# Patient Record
Sex: Male | Born: 1960 | Race: White | Hispanic: No | Marital: Married | Smoking: Current every day smoker
Health system: Southern US, Community
[De-identification: ages and names within clinical notes are randomized; demographics above are authoritative.]

## PROBLEM LIST (undated history)

## (undated) DIAGNOSIS — I10 Essential (primary) hypertension: Secondary | ICD-10-CM

## (undated) DIAGNOSIS — E119 Type 2 diabetes mellitus without complications: Secondary | ICD-10-CM

## (undated) DIAGNOSIS — M1990 Unspecified osteoarthritis, unspecified site: Secondary | ICD-10-CM

## (undated) DIAGNOSIS — K5792 Diverticulitis of intestine, part unspecified, without perforation or abscess without bleeding: Secondary | ICD-10-CM

## (undated) HISTORY — DX: Diverticulitis of intestine, part unspecified, without perforation or abscess without bleeding: K57.92

## (undated) HISTORY — PX: COLONOSCOPY: SHX174

## (undated) HISTORY — PX: NO PAST SURGERIES: SHX2092

## (undated) HISTORY — PX: HYDROCELE EXCISION / REPAIR: SUR1145

---

## 2001-11-19 ENCOUNTER — Emergency Department (HOSPITAL_COMMUNITY): Admission: EM | Admit: 2001-11-19 | Discharge: 2001-11-19 | Payer: Self-pay | Admitting: Emergency Medicine

## 2001-11-19 ENCOUNTER — Encounter: Payer: Self-pay | Admitting: Emergency Medicine

## 2002-01-17 ENCOUNTER — Emergency Department (HOSPITAL_COMMUNITY): Admission: EM | Admit: 2002-01-17 | Discharge: 2002-01-17 | Payer: Self-pay | Admitting: Emergency Medicine

## 2002-01-17 ENCOUNTER — Encounter: Payer: Self-pay | Admitting: Emergency Medicine

## 2003-05-29 ENCOUNTER — Emergency Department (HOSPITAL_COMMUNITY): Admission: EM | Admit: 2003-05-29 | Discharge: 2003-05-29 | Payer: Self-pay | Admitting: Emergency Medicine

## 2003-05-30 ENCOUNTER — Emergency Department (HOSPITAL_COMMUNITY): Admission: EM | Admit: 2003-05-30 | Discharge: 2003-05-30 | Payer: Self-pay | Admitting: *Deleted

## 2005-11-02 ENCOUNTER — Encounter: Admission: RE | Admit: 2005-11-02 | Discharge: 2005-11-02 | Payer: Self-pay | Admitting: Family Medicine

## 2005-11-13 ENCOUNTER — Encounter: Admission: RE | Admit: 2005-11-13 | Discharge: 2005-11-13 | Payer: Self-pay | Admitting: Family Medicine

## 2006-11-29 ENCOUNTER — Ambulatory Visit (HOSPITAL_BASED_OUTPATIENT_CLINIC_OR_DEPARTMENT_OTHER): Admission: RE | Admit: 2006-11-29 | Discharge: 2006-11-29 | Payer: Self-pay | Admitting: Urology

## 2006-11-29 ENCOUNTER — Encounter (INDEPENDENT_AMBULATORY_CARE_PROVIDER_SITE_OTHER): Payer: Self-pay | Admitting: Urology

## 2009-08-31 ENCOUNTER — Encounter: Admission: RE | Admit: 2009-08-31 | Discharge: 2009-10-28 | Payer: Self-pay | Admitting: Family Medicine

## 2010-06-13 NOTE — Op Note (Signed)
NAMECAMAR, GUYTON                ACCOUNT NO.:  [id number]   MEDICAL RECORD NO.:  [id number]          PATIENT TYPE:  AMB   LOCATION:  [facility]                         FACILITY:  [facility]   PHYSICIAN:  Sigmund I. Patsi Sears, M.D.DATE OF BIRTH:  [birth year]   DATE OF PROCEDURE:  DATE OF DISCHARGE:                               OPERATIVE REPORT   DATE OF DICTATION:  11/29/2006   PREOPERATIVE DIAGNOSIS:  Right hydrocele.   POSTOPERATIVE DIAGNOSIS:  Right hydrocele.   OPERATION:  Right hydrocelectomy (2 50 mL plus).   SURGEON:  Sigmund I. Patsi Sears, M.D.   ANESTHESIA:  General LMA.   PREPARATION:  After appropriate pre anesthesia, the patient was brought  to the operating room, placed upon the operating table in the dorsal  supine position where general LMA anesthesia was induced.  He was then  replaced in dorsal lithotomy position where the pubis was prepped with  Betadine solution and draped in the usual fashion.   REVIEW OF HISTORY:  Mr. Vosler is a 50 year old male with a history of a  large right hydrocele times several years, which is enlarging, as well  as a history of hypogonadism and erectile dysfunction .  He is now for  repair.   DESCRIPTION OF PROCEDURE:  A 6 cm right hemi scrotal incision was made,  subcutaneous tissue dissected with the electrosurgical unit.  The right  testicle was delivered into the wound, the tunica vaginalis was incised  and 250 mL of straw-colored fluid were removed.  The tunica vaginalis  was then completely incised, tissue removed and sent to the laboratory  for examination.  Each wing of the hydrocele sac was then sutured and  then the two wings were sutured behind the testicle.  The testicle was  delivered back into the wound.  It was noted that there was a large  amount of yellowish stone type granular material stuck to the tissue  wall, consistent with chronic epididymitis and infection.  The  epididymis, however, appeared normal at this time.   Following  irrigation, a Penrose drain was placed in the scrotum, and sutured in  place with 3-0 Vicryl suture.   The wound was then closed in two layers with 3-0 Vicryl suture, and  sterile dressing was applied.  Fluff dressing was applied and a pair of  mesh pants was placed on the patient.  The patient tolerated the  procedure well, was awakened and taken to the recovery room in good  condition.  Note that he received IV Toradol before awakening, and also  intraoperatively, he received intradermal Marcaine in the wound.     Sigmund I. Patsi Sears, M.D.  Electronically Signed    SIT/MEDQ  D:  11/29/2006  T:  11/30/2006  Job:  [id number]

## 2010-11-08 LAB — POCT I-STAT 4, (NA,K, GLUC, HGB,HCT): Hemoglobin: 18.7 — ABNORMAL HIGH

## 2013-08-30 ENCOUNTER — Emergency Department (HOSPITAL_COMMUNITY)
Admission: EM | Admit: 2013-08-30 | Discharge: 2013-08-30 | Disposition: A | Discharge status: Home / Self Care | Payer: Self-pay | Attending: Emergency Medicine | Admitting: Emergency Medicine

## 2013-08-30 ENCOUNTER — Encounter (HOSPITAL_COMMUNITY): Payer: Self-pay | Admitting: Emergency Medicine

## 2013-08-30 DIAGNOSIS — F17200 Nicotine dependence, unspecified, uncomplicated: Secondary | ICD-10-CM | POA: Insufficient documentation

## 2013-08-30 DIAGNOSIS — M79609 Pain in unspecified limb: Secondary | ICD-10-CM | POA: Insufficient documentation

## 2013-08-30 DIAGNOSIS — M79662 Pain in left lower leg: Secondary | ICD-10-CM

## 2013-08-30 DIAGNOSIS — E119 Type 2 diabetes mellitus without complications: Secondary | ICD-10-CM | POA: Insufficient documentation

## 2013-08-30 DIAGNOSIS — I10 Essential (primary) hypertension: Secondary | ICD-10-CM | POA: Insufficient documentation

## 2013-08-30 HISTORY — DX: Essential (primary) hypertension: I10

## 2013-08-30 HISTORY — DX: Type 2 diabetes mellitus without complications: E11.9

## 2013-08-30 LAB — CBG MONITORING, ED: Glucose-Capillary: 125 mg/dL — ABNORMAL HIGH (ref 70–99)

## 2013-08-30 MED ORDER — IBUPROFEN 800 MG PO TABS: 800.0000 mg | ORAL_TABLET | Freq: Three times a day (TID) | ORAL | Status: DC | PRN

## 2013-08-30 MED ORDER — HYDROCODONE-ACETAMINOPHEN 5-325 MG PO TABS: 1.0000 | ORAL_TABLET | Freq: Four times a day (QID) | ORAL | Status: DC | PRN

## 2013-08-30 NOTE — Progress Notes (Signed)
VASCULAR LAB PRELIMINARY  PRELIMINARY  PRELIMINARY  PRELIMINARY  Left lower extremity venous Doppler completed.    Preliminary report:  There is no DVT or SVT noted in the left lower extremity.   Donald Bender, RVT 08/30/2013, 2:02 PM

## 2013-08-30 NOTE — ED Notes (Addendum)
Pt c/o pain to left knee x 3 weeks. Pt reports pain now more to the back of knee. Pt ambulatory in triage. Pt also reports history of diabetes and hypertension. Pt has been out of his blood pressure and diabetic medications x 1 year due to no insurance.

## 2013-08-30 NOTE — Discharge Instructions (Signed)
Return here as needed.  Followup with the clinic provided.  Use ice and heat on on the area that is sore

## 2013-08-30 NOTE — ED Provider Notes (Signed)
Medical screening examination/treatment/procedure(s) were performed by non-physician practitioner and as supervising physician I was immediately available for consultation/collaboration.   EKG Interpretation None        Malvin Johns, MD 08/30/13 1654

## 2013-08-30 NOTE — ED Provider Notes (Signed)
CSN: [id number]     Arrival date & time 08/30/13  1158 History   First MD Initiated Contact with Patient 08/30/13 1252     Chief Complaint  Patient presents with  . Leg Pain     (Consider location/radiation/quality/duration/timing/severity/associated sxs/prior Treatment) HPI Patient presents to the emergency department with left calf pain that started several days, ago.  The patient, states, that he was out-of-town working when the pain started in his upper calf.  Patient, states, that he does not recall any injury, but still like the pain started in the front part of his knee, but now is located in his calf region.  The patient, states, that he did not take any medications for the pain.  The patient denies chest pain, shortness breath, weakness, dizziness, headache, blurred vision, back pain, numbness, fever, dysuria, incontinence, or abdominal pain.  The patient, states, that palpation makes the pain, worse Past Medical History  Diagnosis Date  . Hypertension   . Diabetes mellitus without complication    History reviewed. No pertinent past surgical history. No family history on file. History  Substance Use Topics  . Smoking status: Current Every Day Smoker  . Smokeless tobacco: Not on file  . Alcohol Use: Yes    Review of Systems  All other systems negative except as documented in the HPI. All pertinent positives and negatives as reviewed in the HPI.   Allergies  Review of patient's allergies indicates no known allergies.  Home Medications   Prior to Admission medications   Not on File   BP 190/88  Pulse 91  Temp(Src) 97.6 F (36.4 C) (Oral)  Resp 20  Ht 6\' 3"  (1.905 m)  Wt 250 lb (113.399 kg)  BMI 31.25 kg/m2  SpO2 91% Physical Exam  Nursing note and vitals reviewed. Constitutional: He is oriented to person, place, and time. He appears well-developed and well-nourished. No distress.  HENT:  Head: Normocephalic and atraumatic.  Mouth/Throat: Oropharynx is clear  and moist.  Eyes: Pupils are equal, round, and reactive to light.  Neck: Normal range of motion. Neck supple.  Cardiovascular: Normal rate, regular rhythm and normal heart sounds.  Exam reveals no gallop and no friction rub.   No murmur heard. Pulmonary/Chest: Effort normal and breath sounds normal. No respiratory distress.  Musculoskeletal:       Left lower leg: He exhibits tenderness. He exhibits no bony tenderness and no swelling.       Legs: Neurological: He is alert and oriented to person, place, and time.  Skin: Skin is warm and dry. No erythema.    ED Course  Procedures (including critical care time) Labs Review Labs Reviewed  CBG MONITORING, ED - Abnormal; Notable for the following:    Glucose-Capillary 125 (*)    All other components within normal limits   Patient has a negative DVT study.  He is advised of the results of all questions were answered.  I have given.  Followup with her primary care Dr. also, will treat with ice and heat to the area that is sore, along with pain control     Brent General, PA-C 08/30/13 1426

## 2013-08-31 NOTE — Progress Notes (Signed)
CM consulted to follow up with patient for medication assist and need for PCP for follow up. CM attempted to contact patient x2 via contact numbers listed. Unable to contact patient and no ability to leave phone message for follow up. No other CM needs identified. Edwyna Shell, RN, BSN, Case Manager 08/31/2013 2:04 PM

## 2013-09-11 ENCOUNTER — Encounter: Payer: Self-pay | Admitting: Internal Medicine

## 2013-09-11 ENCOUNTER — Ambulatory Visit: Payer: Self-pay | Attending: Internal Medicine | Admitting: Internal Medicine

## 2013-09-11 VITALS — BP 156/97 | HR 90 | Temp 98.7°F | Resp 18 | Ht 75.0 in | Wt 259.0 lb

## 2013-09-11 DIAGNOSIS — F17200 Nicotine dependence, unspecified, uncomplicated: Secondary | ICD-10-CM | POA: Insufficient documentation

## 2013-09-11 DIAGNOSIS — E1110 Type 2 diabetes mellitus with ketoacidosis without coma: Secondary | ICD-10-CM | POA: Insufficient documentation

## 2013-09-11 DIAGNOSIS — I10 Essential (primary) hypertension: Secondary | ICD-10-CM | POA: Insufficient documentation

## 2013-09-11 DIAGNOSIS — E119 Type 2 diabetes mellitus without complications: Secondary | ICD-10-CM | POA: Insufficient documentation

## 2013-09-11 DIAGNOSIS — IMO0001 Reserved for inherently not codable concepts without codable children: Secondary | ICD-10-CM

## 2013-09-11 DIAGNOSIS — M79609 Pain in unspecified limb: Secondary | ICD-10-CM | POA: Insufficient documentation

## 2013-09-11 DIAGNOSIS — E1165 Type 2 diabetes mellitus with hyperglycemia: Secondary | ICD-10-CM

## 2013-09-11 LAB — COMPLETE METABOLIC PANEL WITH GFR
ALBUMIN: 4.1 g/dL (ref 3.5–5.2)
ALK PHOS: 46 U/L (ref 39–117)
ALT: 26 U/L (ref 0–53)
AST: 18 U/L (ref 0–37)
BILIRUBIN TOTAL: 0.6 mg/dL (ref 0.2–1.2)
BUN: 10 mg/dL (ref 6–23)
CO2: 27 mEq/L (ref 19–32)
Calcium: 9.3 mg/dL (ref 8.4–10.5)
Chloride: 100 mEq/L (ref 96–112)
Creat: 0.69 mg/dL (ref 0.50–1.35)
GFR, Est African American: >89 mL/min
GLUCOSE: 122 mg/dL — AB (ref 70–99)
POTASSIUM: 4.6 meq/L (ref 3.5–5.3)
SODIUM: 139 meq/L (ref 135–145)
TOTAL PROTEIN: 6.9 g/dL (ref 6.0–8.3)

## 2013-09-11 LAB — LIPID PANEL
CHOL/HDL RATIO: 4.6 ratio
CHOLESTEROL: 202 mg/dL — AB (ref 0–200)
HDL: 44 mg/dL (ref >39)
LDL CALC: 102 mg/dL — AB (ref 0–99)
TRIGLYCERIDES: 281 mg/dL — AB (ref <150)
VLDL: 56 mg/dL — AB (ref 0–40)

## 2013-09-11 LAB — CBC
HCT: 51.3 % (ref 39.0–52.0)
HEMOGLOBIN: 18.3 g/dL — AB (ref 13.0–17.0)
MCH: 34.6 pg — AB (ref 26.0–34.0)
MCHC: 35.7 g/dL (ref 30.0–36.0)
MCV: 97 fL (ref 78.0–100.0)
PLATELETS: 177 10*3/uL (ref 150–400)
RBC: 5.29 MIL/uL (ref 4.22–5.81)
RDW: 12.6 % (ref 11.5–15.5)
WBC: 9.5 10*3/uL (ref 4.0–10.5)

## 2013-09-11 LAB — POCT GLYCOSYLATED HEMOGLOBIN (HGB A1C): Hemoglobin A1C: 6.2

## 2013-09-11 LAB — GLUCOSE, POCT (MANUAL RESULT ENTRY): POC Glucose: 134 mg/dl — AB (ref 70–99)

## 2013-09-11 MED ORDER — LISINOPRIL 10 MG PO TABS: 10.0000 mg | ORAL_TABLET | Freq: Every day | ORAL | Status: DC

## 2013-09-11 MED ORDER — METFORMIN HCL ER 500 MG PO TB24: 500.0000 mg | ORAL_TABLET | Freq: Every day | ORAL | Status: DC

## 2013-09-11 MED ORDER — CLONIDINE HCL 0.1 MG PO TABS
0.2000 mg | ORAL_TABLET | Freq: Once | ORAL | Status: AC
  Administered 2013-09-11: 0.2 mg via ORAL

## 2013-09-11 NOTE — Progress Notes (Signed)
Patient ID: Donald Bender, male   DOB: 06-14-1960, 53 y.o.   MRN: [id number]   EHU:[id number]  ZCH:[id number]  DOB - 25-Feb-1960  CC:  Chief Complaint  Patient presents with  . Hospitalization Follow-up  . Leg Pain  . Hypertension  . Diabetes       HPI: Donald Bender is a 53 y.o. male here today to establish medical care.  He has a past medical history of DM and HTN. He was evaluated in the ER for left calf pain a week ago and was found to be negative for DVT's.  He reports that he continues to have left calf pain that is achy in nature.  He denies any calf swelling, SOB, chest pain, or palpitations. He reports the pain is aggravated by sitting and relieved by walking.  He states that he has not been on metformin or lisinopril for over one year because he lost his insurance. 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   No Known Allergies Past Medical History  Diagnosis Date  . Hypertension   . Diabetes mellitus without complication    Current Outpatient Prescriptions on File Prior to Visit  Medication Sig Dispense Refill  . HYDROcodone-acetaminophen (NORCO/VICODIN) 5-325 MG per tablet Take 1 tablet by mouth every 6 (six) hours as needed for moderate pain.  15 tablet  0  . ibuprofen (ADVIL,MOTRIN) 800 MG tablet Take 1 tablet (800 mg total) by mouth every 8 (eight) hours as needed.  21 tablet  0   No current facility-administered medications on file prior to visit.   History reviewed. No pertinent family history. History   Social History  . Marital Status: Married    Spouse Name: N/A    Number of Children: N/A  . Years of Education: N/A   Occupational History  . Not on file.   Social History Main Topics  . Smoking status: Current Every Day Smoker  . Smokeless tobacco: Not on file  . Alcohol Use: Yes  . Drug Use: No  . Sexual Activity: Not on file   Other Topics Concern  . Not on file   Social History Narrative  . No narrative on file   Review of  Systems  Eyes: Negative.   Respiratory: Negative.   Cardiovascular: Negative.   Gastrointestinal: Negative.   Genitourinary: Negative.   Musculoskeletal:       Left leg pain   Neurological: Positive for tingling (hands) and headaches.  Endo/Heme/Allergies: Positive for polydipsia.  Psychiatric/Behavioral: Negative.        Objective:   Filed Vitals:   09/11/13 1022  BP: 202/119  Pulse: 97  Temp: 98.7 F (37.1 C)  Resp: 18    Physical Exam: Constitutional: Patient appears well-developed and well-nourished. No distress. HENT: Normocephalic, atraumatic, External right and left ear normal. Oropharynx is clear and moist.  Eyes: Conjunctivae and EOM are normal. PERRLA, no scleral icterus. Neck: Normal ROM. Neck supple. No JVD. No tracheal deviation. No thyromegaly. CVS: RRR, S1/S2 +, no murmurs, no gallops, no carotid bruit.  Pulmonary: Effort and breath sounds normal, no stridor, rhonchi, wheezes, rales.  Abdominal: Soft. BS +, no distension, tenderness, rebound or guarding.  Musculoskeletal: Normal range of motion. No edema and no tenderness.  Lymphadenopathy: No lymphadenopathy noted, cervical, Neuro: Alert. Normal reflexes, muscle tone coordination. No cranial nerve deficit. Skin: Skin is warm and dry. No rash noted. Not diaphoretic. No erythema. No pallor. Psychiatric: Normal mood and affect. Behavior, judgment, thought content normal.  Lab Results  Component Value Date   HGB 18.7* 11/29/2006   HCT 55.0* 11/29/2006   Lab Results  Component Value Date   NA 137 11/29/2006   K 4.1 11/29/2006    Lab Results  Component Value Date   HGBA1C 6.2 09/11/2013   Lipid Panel  No results found for this basename: chol, trig, hdl, cholhdl, vldl, ldlcalc       Assessment and plan:   Brailon was seen today for hospitalization follow-up, leg pain, hypertension and diabetes.  Diagnoses and associated orders for this visit:  Type II or unspecified type diabetes mellitus  without mention of complication, uncontrolled - Glucose (CBG) - HgB A1c - Ambulatory referral to Ophthalmology - metFORMIN (GLUCOPHAGE XR) 500 MG 24 hr tablet; Take 1 tablet (500 mg total) by mouth daily with breakfast. - Lipid panel - CBC - COMPLETE METABOLIC PANEL WITH GFR - PSA - TSH  Essential hypertension - cloNIDine (CATAPRES) tablet 0.2 mg; Take 2 tablets (0.2 mg total) by mouth once. - Begin lisinopril (PRINIVIL,ZESTRIL) 10 MG tablet; Take 1 tablet (10 mg total) by mouth daily.  Tobacco use disorder Smoking cessation discussed    Return in about 2 weeks (around 09/25/2013) for Nurse Visit-BP check and 3 mo PCP.    Chari Manning, [facility] [phone] 09/11/2013, 11:14 AM

## 2013-09-11 NOTE — Patient Instructions (Signed)
Smoking Cessation Quitting smoking is important to your health and has many advantages. However, it is not always easy to quit since nicotine is a very addictive drug. Oftentimes, people try 3 times or more before being able to quit. This document explains the best ways for you to prepare to quit smoking. Quitting takes hard work and a lot of effort, but you can do it. ADVANTAGES OF QUITTING SMOKING  You will live longer, feel better, and live better.  Your body will feel the impact of quitting smoking almost immediately.  Within 20 minutes, blood pressure decreases. Your pulse returns to its normal level.  After 8 hours, carbon monoxide levels in the blood return to normal. Your oxygen level increases.  After 24 hours, the chance of having a heart attack starts to decrease. Your breath, hair, and body stop smelling like smoke.  After 48 hours, damaged nerve endings begin to recover. Your sense of taste and smell improve.  After 72 hours, the body is virtually free of nicotine. Your bronchial tubes relax and breathing becomes easier.  After 2 to 12 weeks, lungs can hold more air. Exercise becomes easier and circulation improves.  The risk of having a heart attack, stroke, cancer, or lung disease is greatly reduced.  After 1 year, the risk of coronary heart disease is cut in half.  After 5 years, the risk of stroke falls to the same as a nonsmoker.  After 10 years, the risk of lung cancer is cut in half and the risk of other cancers decreases significantly.  After 15 years, the risk of coronary heart disease drops, usually to the level of a nonsmoker.  If you are pregnant, quitting smoking will improve your chances of having a healthy baby.  The people you live with, especially any children, will be healthier.  You will have extra money to spend on things other than cigarettes. QUESTIONS TO THINK ABOUT BEFORE ATTEMPTING TO QUIT You may want to talk about your answers with your  health care provider.  Why do you want to quit?  If you tried to quit in the past, what helped and what did not?  What will be the most difficult situations for you after you quit? How will you plan to handle them?  Who can help you through the tough times? Your family? Friends? A health care provider?  What pleasures do you get from smoking? What ways can you still get pleasure if you quit? Here are some questions to ask your health care provider:  How can you help me to be successful at quitting?  What medicine do you think would be best for me and how should I take it?  What should I do if I need more help?  What is smoking withdrawal like? How can I get information on withdrawal? GET READY  Set a quit date.  Change your environment by getting rid of all cigarettes, ashtrays, matches, and lighters in your home, car, or work. Do not let people smoke in your home.  Review your past attempts to quit. Think about what worked and what did not. GET SUPPORT AND ENCOURAGEMENT You have a better chance of being successful if you have help. You can get support in many ways.  Tell your family, friends, and coworkers that you are going to quit and need their support. Ask them not to smoke around you.  Get individual, group, or telephone counseling and support. Programs are available at local hospitals and health centers. Call   your local health department for information about programs in your area.  Spiritual beliefs and practices may help some smokers quit.  Download a "quit meter" on your computer to keep track of quit statistics, such as how long you have gone without smoking, cigarettes not smoked, and money saved.  Get a self-help book about quitting smoking and staying off tobacco. LEARN NEW SKILLS AND BEHAVIORS  Distract yourself from urges to smoke. Talk to someone, go for a walk, or occupy your time with a task.  Change your normal routine. Take a different route to work.  Drink tea instead of coffee. Eat breakfast in a different place.  Reduce your stress. Take a hot bath, exercise, or read a book.  Plan something enjoyable to do every day. Reward yourself for not smoking.  Explore interactive web-based programs that specialize in helping you quit. GET MEDICINE AND USE IT CORRECTLY Medicines can help you stop smoking and decrease the urge to smoke. Combining medicine with the above behavioral methods and support can greatly increase your chances of successfully quitting smoking.  Nicotine replacement therapy helps deliver nicotine to your body without the negative effects and risks of smoking. Nicotine replacement therapy includes nicotine gum, lozenges, inhalers, nasal sprays, and skin patches. Some may be available over-the-counter and others require a prescription.  Antidepressant medicine helps people abstain from smoking, but how this works is unknown. This medicine is available by prescription.  Nicotinic receptor partial agonist medicine simulates the effect of nicotine in your brain. This medicine is available by prescription. Ask your health care provider for advice about which medicines to use and how to use them based on your health history. Your health care provider will tell you what side effects to look out for if you choose to be on a medicine or therapy. Carefully read the information on the package. Do not use any other product containing nicotine while using a nicotine replacement product.  RELAPSE OR DIFFICULT SITUATIONS Most relapses occur within the first 3 months after quitting. Do not be discouraged if you start smoking again. Remember, most people try several times before finally quitting. You may have symptoms of withdrawal because your body is used to nicotine. You may crave cigarettes, be irritable, feel very hungry, cough often, get headaches, or have difficulty concentrating. The withdrawal symptoms are only temporary. They are strongest  when you first quit, but they will go away within 10-14 days. To reduce the chances of relapse, try to:  Avoid drinking alcohol. Drinking lowers your chances of successfully quitting.  Reduce the amount of caffeine you consume. Once you quit smoking, the amount of caffeine in your body increases and can give you symptoms, such as a rapid heartbeat, sweating, and anxiety.  Avoid smokers because they can make you want to smoke.  Do not let weight gain distract you. Many smokers will gain weight when they quit, usually less than 10 pounds. Eat a healthy diet and stay active. You can always lose the weight gained after you quit.  Find ways to improve your mood other than smoking. FOR MORE INFORMATION  www.smokefree.gov  Document Released: 01/09/2001 Document Revised: 06/01/2013 Document Reviewed: 04/26/2011 [employer] Patient Information 2015 [employer]. This information is not intended to replace advice given to you by your health care provider. Make sure you discuss any questions you have with your health care provider.  

## 2013-09-11 NOTE — Progress Notes (Signed)
Pt here to establish care for left calf pain intemit x 1 mnth with negative DVT on ultrasound 08/30/13 Pt was seen in [facility] and treated with pain medications. Pt informed of [facility] narcotic policy Pt is taking Vicodin BP- 202/119 97 states he stopped taking medication 1 yr with due to no insurance coverage Untreated diabetes- CBg 134 A1c running Need colonoscopy and T-dap 6/10 pain in left calf. States pain is decreased with walking

## 2013-09-12 LAB — PSA: PSA: 0.56 ng/mL (ref <=4.00)

## 2013-09-12 LAB — TSH: TSH: 3.052 u[IU]/mL (ref 0.350–4.500)

## 2013-09-22 ENCOUNTER — Telehealth: Payer: Self-pay | Admitting: *Deleted

## 2013-09-22 NOTE — Telephone Encounter (Signed)
Left message with male to return call      Result Note      Cholesterol is elevated. Please educate patient on diet and exercise changes

## 2013-09-24 ENCOUNTER — Ambulatory Visit: Payer: Self-pay | Admitting: Home Health Services

## 2013-09-25 ENCOUNTER — Ambulatory Visit: Payer: Self-pay | Attending: Internal Medicine | Admitting: *Deleted

## 2013-09-25 VITALS — BP 143/86 | HR 90 | Resp 16

## 2013-09-25 DIAGNOSIS — I10 Essential (primary) hypertension: Secondary | ICD-10-CM

## 2013-09-25 NOTE — Patient Instructions (Signed)
Lisinopril tablets What is this medicine? LISINOPRIL (lyse IN oh pril) is an ACE inhibitor. This medicine is used to treat high blood pressure and heart failure. It is also used to protect the heart immediately after a heart attack. This medicine may be used for other purposes; ask your health care provider or pharmacist if you have questions. COMMON BRAND NAME(S): Prinivil, Zestril What should I tell my health care provider before I take this medicine? They need to know if you have any of these conditions: -diabetes -heart or blood vessel disease -immune system disease like lupus or scleroderma -kidney disease -low blood pressure -previous swelling of the tongue, face, or lips with difficulty breathing, difficulty swallowing, hoarseness, or tightening of the throat -an unusual or allergic reaction to lisinopril, other ACE inhibitors, insect venom, foods, dyes, or preservatives -pregnant or trying to get pregnant -breast-feeding How should I use this medicine? Take this medicine by mouth with a glass of water. Follow the directions on your prescription label. You may take this medicine with or without food. Take your medicine at regular intervals. Do not stop taking this medicine except on the advice of your doctor or health care professional. Talk to your pediatrician regarding the use of this medicine in children. Special care may be needed. While this drug may be prescribed for children as young as 30 years of age for selected conditions, precautions do apply. Overdosage: If you think you have taken too much of this medicine contact a poison control center or emergency room at once. NOTE: This medicine is only for you. Do not share this medicine with others. What if I miss a dose? If you miss a dose, take it as soon as you can. If it is almost time for your next dose, take only that dose. Do not take double or extra doses. What may interact with this medicine? -diuretics -lithium -NSAIDs,  medicines for pain and inflammation, like ibuprofen or naproxen -over-the-counter herbal supplements like hawthorn -potassium salts or potassium supplements -salt substitutes This list may not describe all possible interactions. Give your health care provider a list of all the medicines, herbs, non-prescription drugs, or dietary supplements you use. Also tell them if you smoke, drink alcohol, or use illegal drugs. Some items may interact with your medicine. What should I watch for while using this medicine? Visit your doctor or health care professional for regular check ups. Check your blood pressure as directed. Ask your doctor what your blood pressure should be, and when you should contact him or her. Call your doctor or health care professional if you notice an irregular or fast heart beat. Women should inform their doctor if they wish to become pregnant or think they might be pregnant. There is a potential for serious side effects to an unborn child. Talk to your health care professional or pharmacist for more information. Check with your doctor or health care professional if you get an attack of severe diarrhea, nausea and vomiting, or if you sweat a lot. The loss of too much body fluid can make it dangerous for you to take this medicine. You may get drowsy or dizzy. Do not drive, use machinery, or do anything that needs mental alertness until you know how this drug affects you. Do not stand or sit up quickly, especially if you are an older patient. This reduces the risk of dizzy or fainting spells. Alcohol can make you more drowsy and dizzy. Avoid alcoholic drinks. Avoid salt substitutes unless you are told  otherwise by your doctor or health care professional. Do not treat yourself for coughs, colds, or pain while you are taking this medicine without asking your doctor or health care professional for advice. Some ingredients may increase your blood pressure. What side effects may I notice from  receiving this medicine? Side effects that you should report to your doctor or health care professional as soon as possible: -abdominal pain with or without nausea or vomiting -allergic reactions like skin rash or hives, swelling of the hands, feet, face, lips, throat, or tongue -dark urine -difficulty breathing -dizzy, lightheaded or fainting spell -fever or sore throat -irregular heart beat, chest pain -pain or difficulty passing urine -redness, blistering, peeling or loosening of the skin, including inside the mouth -unusually weak -yellowing of the eyes or skin Side effects that usually do not require medical attention (report to your doctor or health care professional if they continue or are bothersome): -change in taste -cough -decreased sexual function or desire -headache -sun sensitivity -tiredness This list may not describe all possible side effects. Call your doctor for medical advice about side effects. You may report side effects to FDA at 1-800-FDA-1088. Where should I keep my medicine? Keep out of the reach of children. Store at room temperature between 15 and 30 degrees C (59 and 86 degrees F). Protect from moisture. Keep container tightly closed. Throw away any unused medicine after the expiration date. NOTE: This sheet is a summary. It may not cover all possible information. If you have questions about this medicine, talk to your doctor, pharmacist, or health care provider.  2015, Elsevier/Gold Standard. (2007-07-21 17:36:32)  

## 2013-09-25 NOTE — Progress Notes (Signed)
Patient here for BP check. Patient denies any cough, chest pain, headache, or blurred vision. Patient states he has taken his medication as prescribed.

## 2013-09-28 ENCOUNTER — Ambulatory Visit (INDEPENDENT_AMBULATORY_CARE_PROVIDER_SITE_OTHER): Payer: Self-pay | Admitting: Home Health Services

## 2013-09-28 DIAGNOSIS — E119 Type 2 diabetes mellitus without complications: Secondary | ICD-10-CM

## 2013-09-28 NOTE — Progress Notes (Signed)
DIABETES Pt came in to have a retinal scan per diabetic care.   Image was taken and submitted to [facility]-DR. [name] for reading.    Results will be available in 1-2 weeks.  Results will be given to PCP for review and to contact patient.  Donald Bender

## 2014-03-16 ENCOUNTER — Other Ambulatory Visit: Payer: Self-pay | Admitting: Internal Medicine

## 2014-03-22 ENCOUNTER — Other Ambulatory Visit: Payer: Self-pay | Admitting: *Deleted

## 2014-03-22 DIAGNOSIS — E119 Type 2 diabetes mellitus without complications: Secondary | ICD-10-CM

## 2014-03-22 DIAGNOSIS — I10 Essential (primary) hypertension: Secondary | ICD-10-CM

## 2014-03-22 MED ORDER — METFORMIN HCL ER 500 MG PO TB24: 500.0000 mg | ORAL_TABLET | Freq: Every day | ORAL | Status: DC

## 2014-03-22 MED ORDER — LISINOPRIL 10 MG PO TABS: 10.0000 mg | ORAL_TABLET | Freq: Every day | ORAL | Status: DC

## 2014-03-22 NOTE — Progress Notes (Signed)
Pt needed refills for his medications. I rewrote the 2 medications and told Larkin Ina to tell the pt to make another appointment.

## 2014-03-29 ENCOUNTER — Ambulatory Visit: Payer: Self-pay | Attending: Internal Medicine | Admitting: Internal Medicine

## 2014-03-29 ENCOUNTER — Encounter: Payer: Self-pay | Admitting: Internal Medicine

## 2014-03-29 VITALS — BP 168/98 | HR 96 | Temp 98.0°F | Resp 16 | Wt 266.2 lb

## 2014-03-29 DIAGNOSIS — Z720 Tobacco use: Secondary | ICD-10-CM

## 2014-03-29 DIAGNOSIS — I10 Essential (primary) hypertension: Secondary | ICD-10-CM | POA: Insufficient documentation

## 2014-03-29 DIAGNOSIS — F17200 Nicotine dependence, unspecified, uncomplicated: Secondary | ICD-10-CM

## 2014-03-29 DIAGNOSIS — F17210 Nicotine dependence, cigarettes, uncomplicated: Secondary | ICD-10-CM | POA: Insufficient documentation

## 2014-03-29 DIAGNOSIS — E119 Type 2 diabetes mellitus without complications: Secondary | ICD-10-CM | POA: Insufficient documentation

## 2014-03-29 LAB — POCT GLYCOSYLATED HEMOGLOBIN (HGB A1C): Hemoglobin A1C: 6.4

## 2014-03-29 LAB — GLUCOSE, POCT (MANUAL RESULT ENTRY): POC Glucose: 218 mg/dl — AB (ref 70–99)

## 2014-03-29 MED ORDER — LISINOPRIL 20 MG PO TABS: 20.0000 mg | ORAL_TABLET | Freq: Every day | ORAL | Status: DC

## 2014-03-29 MED ORDER — METFORMIN HCL ER 500 MG PO TB24: 500.0000 mg | ORAL_TABLET | Freq: Every day | ORAL | Status: DC

## 2014-03-29 NOTE — Progress Notes (Signed)
Patient states here for follow up on his diabetes and HTN Requesting refills on his medications

## 2014-03-29 NOTE — Patient Instructions (Signed)
I have increased your Lisinopril to 20 mg daily. Please begin this regimen tomorrow.   Chronic Obstructive Pulmonary Disease Chronic obstructive pulmonary disease (COPD) is a common lung condition in which airflow from the lungs is limited. COPD is a general term that can be used to describe many different lung problems that limit airflow, including both chronic bronchitis and emphysema. If you have COPD, your lung function will probably never return to normal, but there are measures you can take to improve lung function and make yourself feel better.  CAUSES   Smoking (common).   Exposure to secondhand smoke.   Genetic problems.  Chronic inflammatory lung diseases or recurrent infections. SYMPTOMS   Shortness of breath, especially with physical activity.   Deep, persistent (chronic) cough with a large amount of thick mucus.   Wheezing.   Rapid breaths (tachypnea).   Gray or bluish discoloration (cyanosis) of the skin, especially in fingers, toes, or lips.   Fatigue.   Weight loss.   Frequent infections or episodes when breathing symptoms become much worse (exacerbations).   Chest tightness. DIAGNOSIS  Your health care provider will take a medical history and perform a physical examination to make the initial diagnosis. Additional tests for COPD may include:   Lung (pulmonary) function tests.  Chest X-ray.  CT scan.  Blood tests. TREATMENT  Treatment available to help you feel better when you have COPD includes:   Inhaler and nebulizer medicines. These help manage the symptoms of COPD and make your breathing more comfortable.  Supplemental oxygen. Supplemental oxygen is only helpful if you have a low oxygen level in your blood.   Exercise and physical activity. These are beneficial for nearly all people with COPD. Some people may also benefit from a pulmonary rehabilitation program. HOME CARE INSTRUCTIONS   Take all medicines (inhaled or pills) as  directed by your health care provider.  Avoid over-the-counter medicines or cough syrups that dry up your airway (such as antihistamines) and slow down the elimination of secretions unless instructed otherwise by your health care provider.   If you are a smoker, the most important thing that you can do is stop smoking. Continuing to smoke will cause further lung damage and breathing trouble. Ask your health care provider for help with quitting smoking. He or she can direct you to community resources or hospitals that provide support.  Avoid exposure to irritants such as smoke, chemicals, and fumes that aggravate your breathing.  Use oxygen therapy and pulmonary rehabilitation if directed by your health care provider. If you require home oxygen therapy, ask your health care provider whether you should purchase a pulse oximeter to measure your oxygen level at home.   Avoid contact with individuals who have a contagious illness.  Avoid extreme temperature and humidity changes.  Eat healthy foods. Eating smaller, more frequent meals and resting before meals may help you maintain your strength.  Stay active, but balance activity with periods of rest. Exercise and physical activity will help you maintain your ability to do things you want to do.  Preventing infection and hospitalization is very important when you have COPD. Make sure to receive all the vaccines your health care provider recommends, especially the pneumococcal and influenza vaccines. Ask your health care provider whether you need a pneumonia vaccine.  Learn and use relaxation techniques to manage stress.  Learn and use controlled breathing techniques as directed by your health care provider. Controlled breathing techniques include:   Pursed lip breathing. Start by breathing  in (inhaling) through your nose for 1 second. Then, purse your lips as if you were going to whistle and breathe out (exhale) through the pursed lips for 2  seconds.   Diaphragmatic breathing. Start by putting one hand on your abdomen just above your waist. Inhale slowly through your nose. The hand on your abdomen should move out. Then purse your lips and exhale slowly. You should be able to feel the hand on your abdomen moving in as you exhale.   Learn and use controlled coughing to clear mucus from your lungs. Controlled coughing is a series of short, progressive coughs. The steps of controlled coughing are:  1. Lean your head slightly forward.  2. Breathe in deeply using diaphragmatic breathing.  3. Try to hold your breath for 3 seconds.  4. Keep your mouth slightly open while coughing twice.  5. Spit any mucus out into a tissue.  6. Rest and repeat the steps once or twice as needed. SEEK MEDICAL CARE IF:   You are coughing up more mucus than usual.   There is a change in the color or thickness of your mucus.   Your breathing is more labored than usual.   Your breathing is faster than usual.  SEEK IMMEDIATE MEDICAL CARE IF:   You have shortness of breath while you are resting.   You have shortness of breath that prevents you from:  Being able to talk.   Performing your usual physical activities.   You have chest pain lasting longer than 5 minutes.   Your skin color is more cyanotic than usual.  You measure low oxygen saturations for longer than 5 minutes with a pulse oximeter. MAKE SURE YOU:   Understand these instructions.  Will watch your condition.  Will get help right away if you are not doing well or get worse. Document Released: 10/25/2004 Document Revised: 06/01/2013 Document Reviewed: 09/11/2012 [facility] Patient Information 2015 [place], [place]. This information is not intended to replace advice given to you by your health care provider. Make sure you discuss any questions you have with your health care provider.

## 2014-03-29 NOTE — Progress Notes (Signed)
Patient ID: Donald Bender, male   DOB: Jan 29, 1961, 54 y.o.   MRN: [id number] 1. HTN: Medication: Lisinopril daily Home BP monitoring:does not check  Positive ROS none Negative ROS: headaches, chest pain, palpitations, edema   2. DM2:  Medication: Metformin daily without skipped doses.  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  Neck: No JVD present.  Cardiovascular: Normal rate, regular rhythm and normal heart sounds.   Pulmonary/Chest: Effort normal and breath sounds normal.  Musculoskeletal: Normal range of motion. He exhibits no edema.  Neurological: He is alert and oriented to person, place, and time.  Skin: Skin is warm and dry.  Psychiatric: He has a normal mood and affect.    Allister was seen today for follow-up.  Diagnoses and all orders for this visit:  Type 2 diabetes mellitus without complication Orders: -     Glucose (CBG) -     HgB A1c -     metFORMIN (GLUCOPHAGE XR) 500 MG 24 hr tablet; Take 1 tablet (500 mg total) by mouth daily with breakfast. -     Microalbumin, urine Patients diabetes is well control as evidence by consistently low a1c.  Patient will continue with current therapy and continue to make necessary lifestyle changes.  Reviewed foot care, diet, exercise, annual health maintenance with patient.   Essential hypertension Orders: -     lisinopril (PRINIVIL,ZESTRIL) 20 MG tablet; Take 1 tablet (20 mg total) by mouth daily. Patient blood pressure remains elevated today, will increase BP medication and have patient to return in 2 weeks for blood pressure recheck with nurse. Stressed diet changes, regular exercise regimen, and modifiable risk factors. Will follow up with CMP as needed,  Will follow up with patient in 3-6 months.   Smoking Smoking cessation discussed for 3 minutes, patient is not willing to quit at this time. Will continue to assess on each visit. Discussed increased risk for diseases such as cancer, heart disease, and stroke.   Return in about 2 weeks (around 04/12/2014) for Nurse Visit-BP check and 6 months PCP.  Chari Manning, NP 03/29/2014 1:53 PM

## 2014-03-30 LAB — MICROALBUMIN, URINE: Microalb, Ur: 3.9 mg/dL — ABNORMAL HIGH (ref <2.0)

## 2014-04-06 ENCOUNTER — Telehealth: Payer: Self-pay | Admitting: *Deleted

## 2014-04-06 NOTE — Telephone Encounter (Signed)
Left message to return my call.  

## 2014-04-06 NOTE — Telephone Encounter (Signed)
-----   Message from Lance Bosch, NP sent at 03/30/2014  8:36 AM EST ----- Had some protein in urine, but that should improve since we increased his BP medication/.

## 2014-04-15 ENCOUNTER — Ambulatory Visit: Payer: Self-pay | Attending: Internal Medicine | Admitting: *Deleted

## 2014-04-15 VITALS — BP 138/80 | HR 97 | Temp 98.1°F | Resp 18

## 2014-04-15 DIAGNOSIS — Z720 Tobacco use: Secondary | ICD-10-CM | POA: Insufficient documentation

## 2014-04-15 DIAGNOSIS — E119 Type 2 diabetes mellitus without complications: Secondary | ICD-10-CM | POA: Insufficient documentation

## 2014-04-15 DIAGNOSIS — I10 Essential (primary) hypertension: Secondary | ICD-10-CM | POA: Insufficient documentation

## 2014-04-15 DIAGNOSIS — Z23 Encounter for immunization: Secondary | ICD-10-CM

## 2014-04-15 NOTE — Progress Notes (Signed)
Patient presents for BP check Med list reviewed; states taking all meds, however, only taking 10 mg lisinopril daily Discussed need for low sodium diet and using Mrs. Dash as alternative to salt Encouraged to choose foods with 5% or less of daily value for sodium. Discussed walking 30 minutes per day for exercise. States he will be joining gym soon. Patient denies headaches, blurred vision, SHOB, chest pain or pressure Smoking 1 ppd; trying to quit Labs reviewed from last OV  BP 138/80 P 97 R 18  T 98.1 oral SPO2  92%  Patient will begin taking lisinopril 20 mg daily and Return in 2-3 weeks for nurse visit for BP check  Patient advised to call for med refills at least 7 days before running out so as not to go without.  Patient aware that he is to f/u with PCP 3 months from last visit (Due 06/27/14)  Pneumococcal vaccine given  Patient given literature on DASH Eating Plan

## 2014-04-15 NOTE — Patient Instructions (Signed)
DASH Eating Plan °DASH stands for "Dietary Approaches to Stop Hypertension." The DASH eating plan is a healthy eating plan that has been shown to reduce high blood pressure (hypertension). Additional health benefits may include reducing the risk of type 2 diabetes mellitus, heart disease, and stroke. The DASH eating plan may also help with weight loss. °WHAT DO I NEED TO KNOW ABOUT THE DASH EATING PLAN? °For the DASH eating plan, you will follow these general guidelines: °· Choose foods with a percent daily value for sodium of less than 5% (as listed on the food label). °· Use salt-free seasonings or herbs instead of table salt or sea salt. °· Check with your health care provider or pharmacist before using salt substitutes. °· Eat lower-sodium products, often labeled as "lower sodium" or "no salt added." °· Eat fresh foods. °· Eat more vegetables, fruits, and low-fat dairy products. °· Choose whole grains. Look for the word "whole" as the first word in the ingredient list. °· Choose fish and skinless chicken or turkey more often than red meat. Limit fish, poultry, and meat to 6 oz (170 g) each day. °· Limit sweets, desserts, sugars, and sugary drinks. °· Choose heart-healthy fats. °· Limit cheese to 1 oz (28 g) per day. °· Eat more home-cooked food and less restaurant, buffet, and fast food. °· Limit fried foods. °· Cook foods using methods other than frying. °· Limit canned vegetables. If you do use them, rinse them well to decrease the sodium. °· When eating at a restaurant, ask that your food be prepared with less salt, or no salt if possible. °WHAT FOODS CAN I EAT? °Seek help from a dietitian for individual calorie needs. °Grains °Whole grain or whole wheat bread. Brown rice. Whole grain or whole wheat pasta. Quinoa, bulgur, and whole grain cereals. Low-sodium cereals. Corn or whole wheat flour tortillas. Whole grain cornbread. Whole grain crackers. Low-sodium crackers. °Vegetables °Fresh or frozen vegetables  (raw, steamed, roasted, or grilled). Low-sodium or reduced-sodium tomato and vegetable juices. Low-sodium or reduced-sodium tomato sauce and paste. Low-sodium or reduced-sodium canned vegetables.  °Fruits °All fresh, canned (in natural juice), or frozen fruits. °Meat and Other Protein Products °Ground beef (85% or leaner), grass-fed beef, or beef trimmed of fat. Skinless chicken or turkey. Ground chicken or turkey. Pork trimmed of fat. All fish and seafood. Eggs. Dried beans, peas, or lentils. Unsalted nuts and seeds. Unsalted canned beans. °Dairy °Low-fat dairy products, such as skim or 1% milk, 2% or reduced-fat cheeses, low-fat ricotta or cottage cheese, or plain low-fat yogurt. Low-sodium or reduced-sodium cheeses. °Fats and Oils °Tub margarines without trans fats. Light or reduced-fat mayonnaise and salad dressings (reduced sodium). Avocado. Safflower, olive, or canola oils. Natural peanut or almond butter. °Other °Unsalted popcorn and pretzels. °The items listed above may not be a complete list of recommended foods or beverages. Contact your dietitian for more options. °WHAT FOODS ARE NOT RECOMMENDED? °Grains °White bread. White pasta. White rice. Refined cornbread. Bagels and croissants. Crackers that contain trans fat. °Vegetables °Creamed or fried vegetables. Vegetables in a cheese sauce. Regular canned vegetables. Regular canned tomato sauce and paste. Regular tomato and vegetable juices. °Fruits °Dried fruits. Canned fruit in light or heavy syrup. Fruit juice. °Meat and Other Protein Products °Fatty cuts of meat. Ribs, chicken wings, bacon, sausage, bologna, salami, chitterlings, fatback, hot dogs, bratwurst, and packaged luncheon meats. Salted nuts and seeds. Canned beans with salt. °Dairy °Whole or 2% milk, cream, half-and-half, and cream cheese. Whole-fat or sweetened yogurt. Full-fat   cheeses or blue cheese. Nondairy creamers and whipped toppings. Processed cheese, cheese spreads, or cheese  curds. °Condiments °Onion and garlic salt, seasoned salt, table salt, and sea salt. Canned and packaged gravies. Worcestershire sauce. Tartar sauce. Barbecue sauce. Teriyaki sauce. Soy sauce, including reduced sodium. Steak sauce. Fish sauce. Oyster sauce. Cocktail sauce. Horseradish. Ketchup and mustard. Meat flavorings and tenderizers. Bouillon cubes. Hot sauce. Tabasco sauce. Marinades. Taco seasonings. Relishes. °Fats and Oils °Butter, stick margarine, lard, shortening, ghee, and bacon fat. Coconut, palm kernel, or palm oils. Regular salad dressings. °Other °Pickles and olives. Salted popcorn and pretzels. °The items listed above may not be a complete list of foods and beverages to avoid. Contact your dietitian for more information. °WHERE CAN I FIND MORE INFORMATION? °National Heart, [employer], and [employer] Institute: www.nhlbi.nih.gov/health/health-topics/topics/dash/ °Document Released: 01/04/2011 Document Revised: 06/01/2013 Document Reviewed: 11/19/2012 °ExitCare® Patient Information ©2015 [employer]. This information is not intended to replace advice given to you by your health care provider. Make sure you discuss any questions you have with your health care provider. ° °

## 2014-05-06 ENCOUNTER — Ambulatory Visit: Payer: Self-pay | Attending: Internal Medicine | Admitting: *Deleted

## 2014-05-06 VITALS — BP 164/74 | HR 97 | Temp 97.9°F | Resp 20

## 2014-05-06 DIAGNOSIS — I10 Essential (primary) hypertension: Secondary | ICD-10-CM | POA: Insufficient documentation

## 2014-05-06 DIAGNOSIS — Z720 Tobacco use: Secondary | ICD-10-CM | POA: Insufficient documentation

## 2014-05-06 MED ORDER — LISINOPRIL 20 MG PO TABS: 30.0000 mg | ORAL_TABLET | Freq: Every day | ORAL | Status: DC

## 2014-05-06 NOTE — Progress Notes (Signed)
Patient presents for BP check Med list reviewed; states taking all meds as directed Discussed need for low sodium diet and using Mrs. Dash as alternative to salt Encouraged to choose foods with 5% or less of daily value for sodium. This was discussed at last visit and patient has not made changes Discussed walking 30 minutes per day for exercise. States he has not started yet but will Patient denies headaches, blurred vision, SHOB, chest pain or pressure Continues to smoke 1 ppd; not trying to quit at this time  BP 164/74  left arm manually with large cuff P 97 R 20  T  97.9 oral SPO2  96%  Per PCP: Increase lisinopril to 30 mg daily Patient to return in 2 weeks for nurse visit for BP check  Patient advised to call for med refills at least 7 days before running out so as not to go without.

## 2014-11-14 ENCOUNTER — Emergency Department (HOSPITAL_COMMUNITY)
Admission: EM | Admit: 2014-11-14 | Discharge: 2014-11-14 | Disposition: A | Discharge status: Home / Self Care | Payer: Self-pay | Attending: Emergency Medicine | Admitting: Emergency Medicine

## 2014-11-14 ENCOUNTER — Emergency Department (HOSPITAL_COMMUNITY): Payer: Self-pay

## 2014-11-14 ENCOUNTER — Encounter (HOSPITAL_COMMUNITY): Payer: Self-pay | Admitting: *Deleted

## 2014-11-14 DIAGNOSIS — M25561 Pain in right knee: Secondary | ICD-10-CM | POA: Insufficient documentation

## 2014-11-14 DIAGNOSIS — Z79899 Other long term (current) drug therapy: Secondary | ICD-10-CM | POA: Insufficient documentation

## 2014-11-14 DIAGNOSIS — Z720 Tobacco use: Secondary | ICD-10-CM | POA: Insufficient documentation

## 2014-11-14 DIAGNOSIS — I10 Essential (primary) hypertension: Secondary | ICD-10-CM | POA: Insufficient documentation

## 2014-11-14 DIAGNOSIS — E119 Type 2 diabetes mellitus without complications: Secondary | ICD-10-CM | POA: Insufficient documentation

## 2014-11-14 MED ORDER — NAPROXEN 500 MG PO TABS: 500.0000 mg | ORAL_TABLET | Freq: Two times a day (BID) | ORAL | Status: DC

## 2014-11-14 NOTE — Discharge Instructions (Signed)
Take the prescribed medication as directed.  May ice/elevate knee at home to help with pain/swelling. Recommend knee exercises to help regain strength. Follow-up with Dr. Ninfa Linden if desired-- call to make appt. Return to the ED for new or worsening symptoms.

## 2014-11-14 NOTE — ED Provider Notes (Signed)
CSN: [id number]     Arrival date & time 11/14/14  1138 History  By signing my name below, I, Starleen Arms, attest that this documentation has been prepared under the direction and in the presence of Quincy Carnes, PA-C. Electronically Signed: Starleen Arms ED Scribe. 11/14/2014. 1:51 PM.    Chief Complaint  Patient presents with  . Knee Pain   The history is provided by the patient. No language interpreter was used.   HPI Comments: VOLLIE BRUNTY is a 54 y.o. male who presents to the Emergency Department complaining intermittently sharp knee pain for several weeks that worsened yesterday, worse with ROM, partially relieved by topical arthritis cream.  He feels like his knee may buckle when he ambulates and had one episode where the knee "locked up."  The patient denies new injury.  He denies fever.  States prior knee injury several years ago, did not require surgery.  No numbness/weakness.  Continues ambulating well.  VSS.  Past Medical History  Diagnosis Date  . Hypertension   . Diabetes mellitus without complication ([place])    History reviewed. No pertinent past surgical history. Family History  Problem Relation Age of Onset  . Leukemia Mother 7   Social History  Substance Use Topics  . Smoking status: Current Every Day Smoker  . Smokeless tobacco: None     Comment: Smoking 1 ppd  . Alcohol Use: 0.0 oz/week    0 Standard drinks or equivalent per week    Review of Systems A complete 10 system review of systems was obtained and all systems are negative except as noted in the HPI and PMH.    Allergies  Review of patient's allergies indicates no known allergies.  Home Medications   Prior to Admission medications   Medication Sig Start Date End Date Taking? Authorizing Provider  HYDROcodone-acetaminophen (NORCO/VICODIN) 5-325 MG per tablet Take 1 tablet by mouth every 6 (six) hours as needed for moderate pain. Patient not taking: Reported on 04/15/2014 08/30/13   Dalia Heading, PA-C  ibuprofen (ADVIL,MOTRIN) 800 MG tablet Take 1 tablet (800 mg total) by mouth every 8 (eight) hours as needed. 08/30/13   Dalia Heading, PA-C  lisinopril (PRINIVIL,ZESTRIL) 20 MG tablet Take 1.5 tablets (30 mg total) by mouth daily. 05/06/14   Lance Bosch, NP  metFORMIN (GLUCOPHAGE XR) 500 MG 24 hr tablet Take 1 tablet (500 mg total) by mouth daily with breakfast. 03/29/14   Lance Bosch, NP   BP 186/90 mmHg  Pulse 89  Temp(Src) 98.2 F (36.8 C) (Oral)  Resp 18  SpO2 95%   Physical Exam  Constitutional: He is oriented to person, place, and time. He appears well-developed and well-nourished.  HENT:  Head: Normocephalic and atraumatic.  Mouth/Throat: Oropharynx is clear and moist.  Eyes: Conjunctivae and EOM are normal. Pupils are equal, round, and reactive to light.  Neck: Normal range of motion.  Cardiovascular: Normal rate, regular rhythm and normal heart sounds.   Pulmonary/Chest: Effort normal and breath sounds normal.  Musculoskeletal: Normal range of motion. He exhibits no edema.  Right knee grossly normal in appearance, some tenderness noted along lateral joint line; no acute bony deformity or significant swelling noted; full flexion and extension maintained without difficulty; normal tracking, no ligamentous laxity or crepitus; leg is neurovascularly intact, normal gait  Neurological: He is alert and oriented to person, place, and time.  Skin: Skin is warm and dry.  Psychiatric: He has a normal mood and affect.  Nursing note and  vitals reviewed.   ED Course  ORTHOPEDIC INJURY TREATMENT Date/Time: 11/14/2014 2:20 PM Performed by: Larene Pickett Authorized by: Larene Pickett Consent: Verbal consent obtained. Consent given by: patient Patient understanding: patient states understanding of the procedure being performed Required items: required blood products, implants, devices, and special equipment available Patient identity confirmed: verbally with  patient Injury location: knee Location details: right knee Injury type: soft tissue Pre-procedure neurovascular assessment: neurovascularly intact Immobilization: brace Supplies used: elastic bandage Post-procedure neurovascular assessment: post-procedure neurovascularly intact Patient tolerance: Patient tolerated the procedure well with no immediate complications   (including critical care time)  DIAGNOSTIC STUDIES: Oxygen Saturation is 95% on RA, normal by my interpretation.    COORDINATION OF CARE:  1:51 PM Discussed treatment plan with patient at bedside.  Patient acknowledges and agrees with plan.   Labs Review Labs Reviewed - No data to display  Imaging Review No results found.   EKG Interpretation None      MDM   Final diagnoses:  Right knee pain   54 year old male here with right knee pain for the past several weeks, worse over the past few days. No injury, trauma, or falls. Sensation of knee giving out noted. Right knee normal in appearance, tenderness along lateral joint line. No acute bony deformity or swelling noted. Full range of motion, no ligamentous laxity. X-ray was obtained, no acute findings. No signs of septic joint or infection.  Patient placed in knee sleeve for support. Recommended RICE routine at home for adequate relief of pain or swelling. Rx Naprosyn. Rehabilitation exercises also given. Patient given orthopedic follow-up for any new/worsening symptoms.  Discussed plan with patient, he/she acknowledged understanding and agreed with plan of care.  Return precautions given for new or worsening symptoms.  I personally performed the services described in this documentation, which was scribed in my presence. The recorded information has been reviewed and is accurate.  Larene Pickett, PA-C 11/14/14 1420  Veryl Speak, MD 11/14/14 [phone]

## 2014-11-14 NOTE — ED Notes (Signed)
PT refused ice to knee 

## 2014-11-14 NOTE — ED Notes (Signed)
Declined W/C at D/C and was escorted to lobby by RN. 

## 2014-11-14 NOTE — ED Notes (Signed)
Pt reports right knee pain for several days, feels like knee is giving out when he ambulates. Hx of knee injury.

## 2015-03-01 MED FILL — METFORMIN HCL ER 500 MG TAB: 500 | 30 days supply | Qty: 30 | Fill #3

## 2015-03-01 MED FILL — ?LISINOPRIL 20 MG TABLET: 20 | 30 days supply | Qty: 30 | Fill #3

## 2015-03-31 ENCOUNTER — Other Ambulatory Visit: Payer: Self-pay | Admitting: Internal Medicine

## 2015-04-04 MED FILL — ?LISINOPRIL 20 MG TABLET: 20 | 30 days supply | Qty: 30 | Fill #0

## 2015-04-04 MED FILL — METFORMIN HCL ER 500 MG TAB: 500 | 30 days supply | Qty: 30 | Fill #0

## 2015-04-11 ENCOUNTER — Ambulatory Visit: Payer: Self-pay | Attending: Internal Medicine | Admitting: Internal Medicine

## 2015-04-11 ENCOUNTER — Encounter: Payer: Self-pay | Admitting: Internal Medicine

## 2015-04-11 VITALS — BP 182/101 | HR 96 | Temp 98.0°F | Resp 16 | Ht 75.0 in | Wt 274.0 lb

## 2015-04-11 DIAGNOSIS — IMO0001 Reserved for inherently not codable concepts without codable children: Secondary | ICD-10-CM

## 2015-04-11 DIAGNOSIS — Z720 Tobacco use: Secondary | ICD-10-CM | POA: Insufficient documentation

## 2015-04-11 DIAGNOSIS — E119 Type 2 diabetes mellitus without complications: Secondary | ICD-10-CM

## 2015-04-11 DIAGNOSIS — E781 Pure hyperglyceridemia: Secondary | ICD-10-CM | POA: Insufficient documentation

## 2015-04-11 DIAGNOSIS — Z7982 Long term (current) use of aspirin: Secondary | ICD-10-CM | POA: Insufficient documentation

## 2015-04-11 DIAGNOSIS — Z79899 Other long term (current) drug therapy: Secondary | ICD-10-CM | POA: Insufficient documentation

## 2015-04-11 DIAGNOSIS — R030 Elevated blood-pressure reading, without diagnosis of hypertension: Secondary | ICD-10-CM

## 2015-04-11 DIAGNOSIS — I10 Essential (primary) hypertension: Secondary | ICD-10-CM

## 2015-04-11 DIAGNOSIS — Z7984 Long term (current) use of oral hypoglycemic drugs: Secondary | ICD-10-CM | POA: Insufficient documentation

## 2015-04-11 DIAGNOSIS — F17200 Nicotine dependence, unspecified, uncomplicated: Secondary | ICD-10-CM

## 2015-04-11 DIAGNOSIS — E785 Hyperlipidemia, unspecified: Secondary | ICD-10-CM

## 2015-04-11 LAB — COMPLETE METABOLIC PANEL WITH GFR
ALT: 33 U/L (ref 9–46)
AST: 34 U/L (ref 10–35)
Albumin: 3.8 g/dL (ref 3.6–5.1)
Alkaline Phosphatase: 43 U/L (ref 40–115)
BUN: 14 mg/dL (ref 7–25)
CHLORIDE: 102 mmol/L (ref 98–110)
CO2: 27 mmol/L (ref 20–31)
Calcium: 8.7 mg/dL (ref 8.6–10.3)
Creat: 0.66 mg/dL — ABNORMAL LOW (ref 0.70–1.33)
GFR, Est African American: >89 mL/min (ref >=60)
GLUCOSE: 176 mg/dL — AB (ref 65–99)
POTASSIUM: 4 mmol/L (ref 3.5–5.3)
SODIUM: 140 mmol/L (ref 135–146)
Total Bilirubin: 0.5 mg/dL (ref 0.2–1.2)
Total Protein: 6.5 g/dL (ref 6.1–8.1)

## 2015-04-11 LAB — GLUCOSE, POCT (MANUAL RESULT ENTRY): POC Glucose: 135 mg/dl — AB (ref 70–99)

## 2015-04-11 LAB — POCT GLYCOSYLATED HEMOGLOBIN (HGB A1C): HEMOGLOBIN A1C: 7

## 2015-04-11 LAB — LIPID PANEL
CHOL/HDL RATIO: 6.1 ratio — AB (ref <=5.0)
Cholesterol: 215 mg/dL — ABNORMAL HIGH (ref 125–200)
HDL: 35 mg/dL — ABNORMAL LOW (ref >=40)
Triglycerides: 461 mg/dL — ABNORMAL HIGH (ref <150)

## 2015-04-11 MED ORDER — LISINOPRIL 30 MG PO TABS: 30.0000 mg | ORAL_TABLET | Freq: Every day | ORAL | Status: DC

## 2015-04-11 MED ORDER — METFORMIN HCL ER 500 MG PO TB24: 500.0000 mg | ORAL_TABLET | Freq: Every day | ORAL | Status: DC

## 2015-04-11 MED ORDER — CLONIDINE HCL 0.1 MG PO TABS
0.2000 mg | ORAL_TABLET | Freq: Once | ORAL | Status: AC
  Administered 2015-04-11: 0.2 mg via ORAL

## 2015-04-11 MED FILL — LISINOPRIL 10 MG TABLET: 10 | 30 days supply | Qty: 90 | Fill #0

## 2015-04-11 NOTE — Patient Instructions (Signed)
Tobacco Use Disorder Tobacco use disorder (TUD) is a mental disorder. It is the long-term use of tobacco in spite of related health problems or difficulty with normal life activities. Tobacco is most commonly smoked as cigarettes and less commonly as cigars or pipes. Smokeless chewing tobacco and snuff are also popular. People with TUD get a feeling of extreme pleasure (euphoria) from using tobacco and have a desire to use it again and again. Repeated use of tobacco can cause problems. The addictive effects of tobacco are due mainly tothe ingredient nicotine. Nicotine also causes a rush of adrenaline (epinephrine) in the body. This leads to increased blood pressure, heart rate, and breathing rate. These changes may cause problems for people with high blood pressure, weak hearts, or lung disease. High doses of nicotine in children and pets can lead to seizures and death.  Tobacco contains a number of other unsafe chemicals. These chemicals are especially harmful when inhaled as smoke and can damage almost every organ in the body. Smokers live shorter lives than nonsmokers and are at risk of dying from a number of diseases and cancers. Tobacco smoke can also cause health problems for nonsmokers (due to inhaling secondhand smoke). Smoking is also a fire hazard.  TUD usually starts in the late teenage years and is most common in young adults between the ages of 18 and 25 years. People who start smoking earlier in life are more likely to continue smoking as adults. TUD is somewhat more common in men than women. People with TUD are at higher risk for using alcohol and other drugs of abuse. RISK FACTORS Risk factors for TUD include:   Having family members with the disorder.  Being around people who use tobacco.  Having an existing mental health issue such as schizophrenia, depression, bipolar disorder, ADHD, or posttraumatic stress disorder (PTSD). SIGNS AND SYMPTOMS  People with tobacco use disorder have  two or more of the following signs and symptoms within 12 months:   Use of more tobacco over a longer period than intended.   Not able to cut down or control tobacco use.   A lot of time spent obtaining or using tobacco.   Strong desire or urge to use tobacco (craving). Cravings may last for 6 months or longer after quitting.  Use of tobacco even when use leads to major problems at work, school, or home.   Use of tobacco even when use leads to relationship problems.   Giving up or cutting down on important life activities because of tobacco use.   Repeatedly using tobacco in situations where it puts you or others in physical danger, like smoking in bed.   Use of tobacco even when it is known that a physical or mental problem is likely related to tobacco use.   Physical problems are numerous and may include chronic bronchitis, emphysema, lung and other cancers, gum disease, high blood pressure, heart disease, and stroke.   Mental problems caused by tobacco may include difficulty sleeping and anxiety.  Need to use greater amounts of tobacco to get the same effect. This means you have developed a tolerance.   Withdrawal symptoms as a result of stopping or rapidly cutting back use. These symptoms may last a month or more after quitting and include the following:   Depressed, anxious, or irritable mood.   Difficulty concentrating.   Increased appetite.  Restlessness or trouble sleeping.   Use of tobacco to avoid withdrawal symptoms. DIAGNOSIS  Tobacco use disorder is diagnosed by   your health care provider. A diagnosis may be made by:  Your health care provider asking questions about your tobacco use and any problems it may be causing.  A physical exam.  Lab tests.  You may be referred to a mental health professional or addiction specialist. The severity of tobacco use disorder depends on the number of signs and symptoms you have:   Mild--Two or three  symptoms.  Moderate--Four or five symptoms.   Severe--Six or more symptoms.  TREATMENT  Many people with tobacco use disorder are unable to quit on their own and need help. Treatment options include the following:  Nicotine replacement therapy (NRT). NRT provides nicotine without the other harmful chemicals in tobacco. NRT gradually lowers the dosage of nicotine in the body and reduces withdrawal symptoms. NRT is available in over-the-counter forms (gum, lozenges, and skin patches) as well as prescription forms (mouth inhaler and nasal spray).  Medicines.This may include:  Antidepressant medicine that may reduce nicotine cravings.  A medicine that acts on nicotine receptors in the brain to reduce cravings and withdrawal symptoms. It may also block the effects of tobacco in people with TUD who relapse.  Counseling or talk therapy. A form of talk therapy called behavioral therapy is commonly used to treat people with TUD. Behavioral therapy looks at triggers for tobacco use, how to avoid them, and how to cope with cravings. It is most effective in person or by phone but is also available in self-help forms (books and Internet websites).  Support groups. These provide emotional support, advice, and guidance for quitting tobacco. The most effective treatment for TUD is usually a combination of medicine, talk therapy, and support groups. HOME CARE INSTRUCTIONS  Keep all follow-up visits as directed by your health care provider. This is important.  Take medicines only as directed by your health care provider.  Check with your health care provider before starting new prescription or over-the-counter medicines. SEEK MEDICAL CARE IF:  You are not able to take your medicines as prescribed.  Treatment is not helping your TUD and your symptoms get worse. SEEK IMMEDIATE MEDICAL CARE IF:  You have serious thoughts about hurting yourself or others.  You have trouble breathing, chest pain,  sudden weakness, or sudden numbness in part of your body.   This information is not intended to replace advice given to you by your health care provider. Make sure you discuss any questions you have with your health care provider.   Document Released: 09/21/2003 Document Revised: 02/05/2014 Document Reviewed: 03/13/2013 Elsevier Interactive Patient Education 2016 [employer]  

## 2015-04-11 NOTE — Progress Notes (Signed)
Patient here for follow up on his HTN and diabetes Patient presents with elevated blood pressure Patient states he has been out of his medication for a couple of weeks 0.2 mg catapress given per office protocol

## 2015-04-11 NOTE — Progress Notes (Signed)
Patient ID: Donald Bender, male   DOB: 1960/08/28, 55 y.o.   MRN: [id number] SUBJECTIVE: 55 y.o. male for follow up of diabetes and hypertension.  Patient reports that he has been out of all medications for several weeks.  He states that he has been checking his blood pressure and it has been very elevated. Diabetic Review of Systems - medication compliance: compliant all of the time, diabetic diet compliance: compliant all of the time, further diabetic ROS: no polyuria or polydipsia, no chest pain, dyspnea or TIA's, no numbness, tingling or pain in extremities, no unusual visual symptoms, no hypoglycemia.  Other symptoms and concerns: Patient reports that he continues to smoke daily and is not ready to quit now.   Current Outpatient Prescriptions  Medication Sig Dispense Refill  . aspirin EC 81 MG tablet Take 81 mg by mouth daily.    Marland Kitchen lisinopril (PRINIVIL,ZESTRIL) 30 MG tablet Take 1 tablet (30 mg total) by mouth daily. 30 tablet 4  . metFORMIN (GLUCOPHAGE-XR) 500 MG 24 hr tablet Take 1 tablet (500 mg total) by mouth daily with breakfast. 30 tablet 4  . HYDROcodone-acetaminophen (NORCO/VICODIN) 5-325 MG per tablet Take 1 tablet by mouth every 6 (six) hours as needed for moderate pain. (Patient not taking: Reported on 04/15/2014) 15 tablet 0  . ibuprofen (ADVIL,MOTRIN) 800 MG tablet Take 1 tablet (800 mg total) by mouth every 8 (eight) hours as needed. 21 tablet 0  . naproxen (NAPROSYN) 500 MG tablet Take 1 tablet (500 mg total) by mouth 2 (two) times daily with a meal. 30 tablet 0   No current facility-administered medications for this visit.   All other systems negative other than what is stated.  OBJECTIVE: Appearance: alert, well appearing, and in no distress, oriented to person, place, and time and overweight. BP 213/100 mmHg  Pulse 9  Temp(Src) 98 F (36.7 C)  Resp 16  Ht 6\' 3"  (1.905 m)  Wt 274 lb (124.286 kg)  BMI 34.25 kg/m2  SpO2 100%  Exam: heart sounds normal rate, regular  rhythm, normal S1, S2, no murmurs, rubs, clicks or gallops, no JVD, chest clear, no hepatosplenomegaly, no carotid bruits  ASSESSMENT:  Donald Bender was seen today for follow-up.  Diagnoses and all orders for this visit:  Elevated blood pressure -     Given cloNIDine (CATAPRES) tablet 0.2 mg; Take 2 tablets (0.2 mg total) by mouth once in office Explained that he should ask for refills 1 week before running out. BP lower when discharged, encouraged to begin medications today.  Essential hypertension -     lisinopril (PRINIVIL,ZESTRIL) 30 MG tablet; Take 1 tablet (30 mg total) by mouth daily. -     COMPLETE METABOLIC PANEL WITH GFR I have placed patient back on current medication with increase of Lisinopril.   Type 2 diabetes mellitus without complication, without long-term current use of insulin ([facility]) -     Glucose (CBG) -     HgB A1c -    Refilled metFORMIN (GLUCOPHAGE-XR) 500 MG 24 hr tablet; Take 1 tablet (500 mg total) by mouth daily with breakfast. -     Lipid panel Patients diabetes is well control as evidence by consistently low a1c.  Patient will continue with current therapy and continue to make necessary lifestyle changes.  Reviewed foot care, diet, exercise, annual health maintenance with patient.   Tobacco use disorder Smoking cessation discussed for 3 minutes, patient is not willing to quit at this time. Will continue to assess on each visit.  Discussed increased risk for diseases such as cancer, heart disease, and stroke.   Addendum: HLD (hyperlipidemia) -    Begin atorvastatin (LIPITOR) 40 MG tablet; Take 1 tablet (40 mg total) by mouth daily. For cholesterol After receiving lipid panel back I have placed patient on Moderate-High Intensity statin. I have also advised patient to get OTC Fish oil to take twice daily due to severely elevated triglycerides. Lipid panel will need to be repeated in 3 months.  PLAN: See orders for this visit as documented in the electronic medical  record. Issues reviewed with him: diabetic diet discussed in detail, written exchange diet given, low cholesterol diet, weight control and daily exercise discussed, foot care discussed and Podiatry visits discussed, annual eye examinations at Ophthalmology discussed and long term diabetic complications discussed.  Return in about 2 weeks (around 04/25/2015) for Stacy-BP check and 3 mo PCP , DM/HTN.   Lance Bosch, NP 04/13/2015 11:09 AM

## 2015-04-13 MED ORDER — ATORVASTATIN CALCIUM 40 MG PO TABS: 40.0000 mg | ORAL_TABLET | Freq: Every day | ORAL | Status: DC

## 2015-04-14 ENCOUNTER — Telehealth: Payer: Self-pay

## 2015-04-14 MED FILL — ?ATORVASTATIN 40MG TABLET: 40 | 30 days supply | Qty: 30 | Fill #0

## 2015-04-14 NOTE — Telephone Encounter (Signed)
-----   Message from Lance Bosch, NP sent at 04/13/2015 11:15 AM EDT ----- I have sent Atorvastatin 40 mg to take daily---cholesterol is elevated. His triglycerides are also severely elevated. I want him to get some OTC Fish oil to take twice per day, if he cannot afford this you may send Lovaza 2g BID. Thanks.

## 2015-04-14 NOTE — Telephone Encounter (Signed)
Tried to contact patient Patient not available Message left on voice mail to return our call 

## 2015-04-26 ENCOUNTER — Ambulatory Visit: Payer: Self-pay | Attending: Internal Medicine | Admitting: Pharmacist

## 2015-04-26 VITALS — BP 161/98 | HR 96

## 2015-04-26 DIAGNOSIS — I10 Essential (primary) hypertension: Secondary | ICD-10-CM | POA: Insufficient documentation

## 2015-04-26 DIAGNOSIS — Z79899 Other long term (current) drug therapy: Secondary | ICD-10-CM | POA: Insufficient documentation

## 2015-04-26 MED ORDER — LISINOPRIL 40 MG PO TABS: 40.0000 mg | ORAL_TABLET | Freq: Every day | ORAL | Status: DC

## 2015-04-26 MED FILL — LISINOPRIL 40 MG TABLET: 40 | 30 days supply | Qty: 30 | Fill #0

## 2015-04-26 NOTE — Patient Instructions (Signed)
Thanks for coming to see me!  Start taking the lisinopril 40 mg daily  Come back and see me in 1-2 weeks for a blood pressure check!

## 2015-04-26 NOTE — Progress Notes (Signed)
S:    Patient arrives in good spirits.    Presents to the clinic for hypertension evaluation. Patient was referred on 04/11/15 by Chari Manning.  Patient was last seen by Primary Care Provider on 04/11/15.   Patient reports adherence with medications. He took his lisinopril this morning around 7 am.   Current BP Medications include:  Lisinopril 30 mg daily    O:   Last 3 Office BP readings: BP Readings from Last 3 Encounters:  04/26/15 161/98  04/11/15 182/101  11/14/14 167/93    BMET    Component Value Date/Time   NA 140 04/11/2015 1002   K 4.0 04/11/2015 1002   CL 102 04/11/2015 1002   CO2 27 04/11/2015 1002   GLUCOSE 176* 04/11/2015 1002   BUN 14 04/11/2015 1002   CREATININE 0.66* 04/11/2015 1002   CALCIUM 8.7 04/11/2015 1002   GFRNONAA >89 04/11/2015 1002   GFRAA >89 04/11/2015 1002    A/P: Hypertension longstanding currently UNcontrolled on current medications.  Increased dose of lisinopril to 40 mg daily with plans to add an additional agent at next visit if blood pressure is not controlled. Reviewed blood pressure goals with patient and the importance of controlling blood pressure. Patient verbalized understanding.   Results reviewed and written information provided.   Total time in face-to-face counseling 10 minutes.   F/U Clinic Visit with me in 1-2 weeks.

## 2015-05-09 MED FILL — METFORMIN HCL ER 500 MG TAB: 500 | 30 days supply | Qty: 30 | Fill #0

## 2015-05-10 ENCOUNTER — Ambulatory Visit: Payer: Self-pay | Attending: Internal Medicine | Admitting: Pharmacist

## 2015-05-10 ENCOUNTER — Encounter: Payer: Self-pay | Admitting: Pharmacist

## 2015-05-10 VITALS — BP 168/99 | HR 97

## 2015-05-10 DIAGNOSIS — I10 Essential (primary) hypertension: Secondary | ICD-10-CM

## 2015-05-10 MED ORDER — AMLODIPINE BESYLATE 5 MG PO TABS: 5.0000 mg | ORAL_TABLET | Freq: Every day | ORAL | Status: DC

## 2015-05-10 MED FILL — ?AMLODIPINE BESYLATE 5 MG T: 5 | 30 days supply | Qty: 30 | Fill #0

## 2015-05-10 NOTE — Progress Notes (Signed)
S:    Patient arrives in good spirits.    Presents to the clinic for hypertension evaluation. Patient was referred on 04/11/15 by Chari Manning.  Patient was last seen by Primary Care Provider on 04/11/15.   Patient reports adherence with medications. He took his lisinopril this morning.  Current BP Medications include:  Lisinopril 30 mg daily  He does not follow any particular diet and he has not tried to cut salt intake down.     O:   Last 3 Office BP readings: BP Readings from Last 3 Encounters:  05/10/15 168/99  04/26/15 161/98  04/11/15 182/101    BMET    Component Value Date/Time   NA 140 04/11/2015 1002   K 4.0 04/11/2015 1002   CL 102 04/11/2015 1002   CO2 27 04/11/2015 1002   GLUCOSE 176* 04/11/2015 1002   BUN 14 04/11/2015 1002   CREATININE 0.66* 04/11/2015 1002   CALCIUM 8.7 04/11/2015 1002   GFRNONAA >89 04/11/2015 1002   GFRAA >89 04/11/2015 1002    A/P: Hypertension longstanding currently UNcontrolled on current medications.  Continued lisinopril to 40 mg daily and initiated amlodipine 5 mg daily. Will hold off on starting HCTZ at this time due to the risk of hyperglycemia. Reviewed blood pressure goals with patient and the importance of controlling blood pressure. Reviewed amlodipine with patient, including how to take and adverse effects. Also reviewed DASH diet.  Patient verbalized understanding.   Results reviewed and written information provided on DASH diet.   Total time in face-to-face counseling 15 minutes.   F/U Clinic Visit with me in 1-2 weeks.

## 2015-05-10 NOTE — Patient Instructions (Signed)
Thanks for coming to see [place]  Start amlodipine 5 mg daily for blood pressure  Come back in 1-2 weeks for a blood pressure check  DASH Eating Plan DASH stands for "Dietary Approaches to Stop Hypertension." The DASH eating plan is a healthy eating plan that has been shown to reduce high blood pressure (hypertension). Additional health benefits may include reducing the risk of type 2 diabetes mellitus, heart disease, and stroke. The DASH eating plan may also help with weight loss. WHAT DO I NEED TO KNOW ABOUT THE DASH EATING PLAN? For the DASH eating plan, you will follow these general guidelines:  Choose foods with a percent daily value for sodium of less than 5% (as listed on the food label).  Use salt-free seasonings or herbs instead of table salt or sea salt.  Check with your health care provider or pharmacist before using salt substitutes.  Eat lower-sodium products, often labeled as "lower sodium" or "no salt added."  Eat fresh foods.  Eat more vegetables, fruits, and low-fat dairy products.  Choose whole grains. Look for the word "whole" as the first word in the ingredient list.  Choose fish and skinless chicken or [place] more often than red meat. Limit fish, poultry, and meat to 6 oz (170 g) each day.  Limit sweets, desserts, sugars, and sugary drinks.  Choose heart-healthy fats.  Limit cheese to 1 oz (28 g) per day.  Eat more home-cooked food and less restaurant, buffet, and fast food.  Limit fried foods.  Cook foods using methods other than frying.  Limit canned vegetables. If you do use them, rinse them well to decrease the sodium.  When eating at a restaurant, ask that your food be prepared with less salt, or no salt if possible. WHAT FOODS CAN I EAT? Seek help from a dietitian for individual calorie needs. Grains Whole grain or whole wheat bread. Brown rice. Whole grain or whole wheat pasta. Quinoa, bulgur, and whole grain cereals. Low-sodium cereals. Corn or  whole wheat flour tortillas. Whole grain cornbread. Whole grain crackers. Low-sodium crackers. Vegetables Fresh or frozen vegetables (raw, steamed, roasted, or grilled). Low-sodium or reduced-sodium tomato and vegetable juices. Low-sodium or reduced-sodium tomato sauce and paste. Low-sodium or reduced-sodium canned vegetables.  Fruits All fresh, canned (in natural juice), or frozen fruits. Meat and Other Protein Products Ground beef (85% or leaner), grass-fed beef, or beef trimmed of fat. Skinless chicken or [place]. Ground chicken or [place]. Pork trimmed of fat. All fish and seafood. Eggs. Dried beans, peas, or lentils. Unsalted nuts and seeds. Unsalted canned beans. Dairy Low-fat dairy products, such as skim or 1% milk, 2% or reduced-fat cheeses, low-fat ricotta or cottage cheese, or plain low-fat yogurt. Low-sodium or reduced-sodium cheeses. Fats and Oils Tub margarines without trans fats. Light or reduced-fat mayonnaise and salad dressings (reduced sodium). Avocado. Safflower, olive, or canola oils. Natural peanut or almond butter. Other Unsalted popcorn and pretzels. The items listed above may not be a complete list of recommended foods or beverages. Contact your dietitian for more options. WHAT FOODS ARE NOT RECOMMENDED? Grains White bread. White pasta. White rice. Refined cornbread. Bagels and croissants. Crackers that contain trans fat. Vegetables Creamed or fried vegetables. Vegetables in a cheese sauce. Regular canned vegetables. Regular canned tomato sauce and paste. Regular tomato and vegetable juices. Fruits Dried fruits. Canned fruit in light or heavy syrup. Fruit juice. Meat and Other Protein Products Fatty cuts of meat. Ribs, chicken wings, bacon, sausage, bologna, salami, chitterlings, fatback, hot dogs, bratwurst,  and packaged luncheon meats. Salted nuts and seeds. Canned beans with salt. Dairy Whole or 2% milk, cream, half-and-half, and cream cheese. Whole-fat or sweetened  yogurt. Full-fat cheeses or blue cheese. Nondairy creamers and whipped toppings. Processed cheese, cheese spreads, or cheese curds. Condiments Onion and garlic salt, seasoned salt, table salt, and sea salt. Canned and packaged gravies. Worcestershire sauce. Tartar sauce. Barbecue sauce. Teriyaki sauce. Soy sauce, including reduced sodium. Steak sauce. Fish sauce. Oyster sauce. Cocktail sauce. Horseradish. Ketchup and mustard. Meat flavorings and tenderizers. Bouillon cubes. Hot sauce. Tabasco sauce. Marinades. Taco seasonings. Relishes. Fats and Oils Butter, stick margarine, lard, shortening, ghee, and bacon fat. Coconut, palm kernel, or palm oils. Regular salad dressings. Other Pickles and olives. Salted popcorn and pretzels. The items listed above may not be a complete list of foods and beverages to avoid. Contact your dietitian for more information. WHERE CAN I FIND MORE INFORMATION? National Heart, Lung, and Blood Institute: travelstabloid.com   This information is not intended to replace advice given to you by your health care provider. Make sure you discuss any questions you have with your health care provider.   Document Released: 01/04/2011 Document Revised: 02/05/2014 Document Reviewed: 11/19/2012 Elsevier Interactive Patient Education [employer].

## 2015-05-19 MED FILL — ?ATORVASTATIN 40MG TABLET: 40 | 30 days supply | Qty: 30 | Fill #1

## 2015-05-25 ENCOUNTER — Ambulatory Visit: Payer: Self-pay | Admitting: Pharmacist

## 2015-06-13 MED FILL — ?AMLODIPINE BESYLATE 5 MG T: 5 | 30 days supply | Qty: 30 | Fill #1

## 2015-06-13 MED FILL — ?ATORVASTATIN 40MG TABLET: 40 | 30 days supply | Qty: 30 | Fill #2

## 2015-06-13 MED FILL — LISINOPRIL 40 MG TABLET: 40 | 30 days supply | Qty: 30 | Fill #1

## 2015-06-13 MED FILL — METFORMIN HCL ER 500 MG TAB: 500 | 30 days supply | Qty: 30 | Fill #1

## 2015-07-13 MED FILL — ?AMLODIPINE BESYLATE 5 MG T: 5 | 30 days supply | Qty: 30 | Fill #2

## 2015-07-13 MED FILL — LISINOPRIL 40 MG TABLET: 40 | 30 days supply | Qty: 30 | Fill #2

## 2015-07-13 MED FILL — ?ATORVASTATIN 40MG TABLET: 40 | 30 days supply | Qty: 30 | Fill #3

## 2015-07-13 MED FILL — METFORMIN HCL ER 500 MG TAB: 500 | 30 days supply | Qty: 30 | Fill #2

## 2015-08-17 ENCOUNTER — Other Ambulatory Visit: Payer: Self-pay | Admitting: Internal Medicine

## 2015-08-17 MED FILL — ?LISINOPRIL 40 MG TABLET: 40 MG | 30 days supply | Qty: 30 | Fill #0

## 2015-08-17 MED FILL — ?ATORVASTATIN 40MG TABLET: 40 | 30 days supply | Qty: 30 | Fill #4

## 2015-08-17 MED FILL — METFORMIN HCL ER 500 MG TAB: 500 | 30 days supply | Qty: 30 | Fill #3

## 2015-08-17 MED FILL — ?AMLODIPINE BESYLATE 5 MG T: 5 | 30 days supply | Qty: 30 | Fill #3

## 2015-08-17 NOTE — Telephone Encounter (Signed)
Patient requesting Lisinopril

## 2015-09-19 ENCOUNTER — Other Ambulatory Visit: Payer: Self-pay | Admitting: Internal Medicine

## 2015-09-19 MED FILL — ?LISINOPRIL 40 MG TABLET: 40 MG | 30 days supply | Qty: 30 | Fill #0

## 2015-09-19 MED FILL — ?ATORVASTATIN 40MG TABLET: 40 | 30 days supply | Qty: 30 | Fill #5

## 2015-09-19 MED FILL — ?AMLODIPINE BESYLATE 5 MG T: 5 | 30 days supply | Qty: 30 | Fill #0

## 2015-09-19 MED FILL — METFORMIN HCL ER 500 MG TAB: 500 | 30 days supply | Qty: 30 | Fill #4

## 2015-10-21 ENCOUNTER — Other Ambulatory Visit: Payer: Self-pay | Admitting: Internal Medicine

## 2015-10-21 DIAGNOSIS — E119 Type 2 diabetes mellitus without complications: Secondary | ICD-10-CM

## 2015-10-21 MED FILL — AMLODIPINE BESYLATE 5 MG TA: 5 | 30 days supply | Qty: 30 | Fill #0

## 2015-10-21 MED FILL — ATORVASTATIN 40 MG TABLET: 40 | 30 days supply | Qty: 30 | Fill #6

## 2015-10-24 ENCOUNTER — Other Ambulatory Visit: Payer: Self-pay | Admitting: Internal Medicine

## 2015-10-24 MED FILL — METFORMIN HCL ER 500 MG TAB: 500 | 30 days supply | Qty: 30 | Fill #0

## 2015-11-04 ENCOUNTER — Ambulatory Visit: Payer: Self-pay | Admitting: Family Medicine

## 2015-11-04 MED FILL — LISINOPRIL 40 MG TABLET: 40 | 30 days supply | Qty: 30 | Fill #0

## 2015-11-16 ENCOUNTER — Ambulatory Visit: Payer: Self-pay | Attending: Family Medicine | Admitting: Family Medicine

## 2015-11-16 ENCOUNTER — Encounter: Payer: Self-pay | Admitting: Family Medicine

## 2015-11-16 VITALS — BP 130/75 | HR 93 | Temp 98.2°F | Ht 75.0 in | Wt 266.0 lb

## 2015-11-16 DIAGNOSIS — E7849 Other hyperlipidemia: Secondary | ICD-10-CM

## 2015-11-16 DIAGNOSIS — M10071 Idiopathic gout, right ankle and foot: Secondary | ICD-10-CM

## 2015-11-16 DIAGNOSIS — Z79899 Other long term (current) drug therapy: Secondary | ICD-10-CM | POA: Insufficient documentation

## 2015-11-16 DIAGNOSIS — E784 Other hyperlipidemia: Secondary | ICD-10-CM

## 2015-11-16 DIAGNOSIS — Z1159 Encounter for screening for other viral diseases: Secondary | ICD-10-CM

## 2015-11-16 DIAGNOSIS — L84 Corns and callosities: Secondary | ICD-10-CM

## 2015-11-16 DIAGNOSIS — E785 Hyperlipidemia, unspecified: Secondary | ICD-10-CM | POA: Insufficient documentation

## 2015-11-16 DIAGNOSIS — E119 Type 2 diabetes mellitus without complications: Secondary | ICD-10-CM

## 2015-11-16 DIAGNOSIS — Z23 Encounter for immunization: Secondary | ICD-10-CM

## 2015-11-16 DIAGNOSIS — Z7984 Long term (current) use of oral hypoglycemic drugs: Secondary | ICD-10-CM | POA: Insufficient documentation

## 2015-11-16 DIAGNOSIS — I10 Essential (primary) hypertension: Secondary | ICD-10-CM | POA: Insufficient documentation

## 2015-11-16 DIAGNOSIS — M109 Gout, unspecified: Secondary | ICD-10-CM | POA: Insufficient documentation

## 2015-11-16 DIAGNOSIS — Z7982 Long term (current) use of aspirin: Secondary | ICD-10-CM | POA: Insufficient documentation

## 2015-11-16 LAB — POCT GLYCOSYLATED HEMOGLOBIN (HGB A1C): HEMOGLOBIN A1C: 6.1

## 2015-11-16 LAB — GLUCOSE, POCT (MANUAL RESULT ENTRY): POC Glucose: 137 mg/dl — AB (ref 70–99)

## 2015-11-16 MED ORDER — METFORMIN HCL ER 500 MG PO TB24: 500.0000 mg | ORAL_TABLET | Freq: Every day | ORAL | 5 refills | Status: DC

## 2015-11-16 MED ORDER — ATORVASTATIN CALCIUM 40 MG PO TABS: 40.0000 mg | ORAL_TABLET | Freq: Every day | ORAL | 5 refills | Status: DC

## 2015-11-16 MED ORDER — AMLODIPINE BESYLATE 5 MG PO TABS: 5.0000 mg | ORAL_TABLET | Freq: Every day | ORAL | 5 refills | Status: DC

## 2015-11-16 MED ORDER — LISINOPRIL 40 MG PO TABS: 40.0000 mg | ORAL_TABLET | Freq: Every day | ORAL | 5 refills | Status: DC

## 2015-11-16 MED ORDER — COLCHICINE 0.6 MG PO TABS: ORAL_TABLET | ORAL | 1 refills | Status: DC

## 2015-11-16 MED FILL — AMLODIPINE BESYLATE 5 MG TA: 5 | 30 days supply | Qty: 30 | Fill #0

## 2015-11-16 MED FILL — COLCHICINE 0.6 MG TABLET: 0.6 | 15 days supply | Qty: 30 | Fill #0

## 2015-11-16 MED FILL — ATORVASTATIN 40 MG TABLET: 40 | 30 days supply | Qty: 30 | Fill #0

## 2015-11-16 NOTE — Patient Instructions (Signed)

## 2015-11-16 NOTE — Progress Notes (Signed)
Subjective:  Patient ID: Donald Bender, male    DOB: [birth year]  Age: 55 y.o. MRN: [id number]  CC: Hypertension and Diabetes   HPI Donald Bender is a 55 year old male with a history of type 2 diabetes mellitus (A1c 6.1), hypertension, hyperlipidemia, gout who presents today to establish care with me as he was previously followed by the nurse practitioner who is no longer with the practice.  Has been compliant with all his medications but does not exercise regularly. Blood pressure was initially elevated but repeat performed manually came down to 130/75. Denies hypoglycemia or numbness next remedy; is not up-to-date on annual eye exam.  He does have a history of gout and has not been on any medications; informs me that he has a flare in his posterior right foot which has been on for several days and is slowly improving. Prior to this flare the last one was 3 years ago.  He is willing to receive the flu shot today  Past Medical History:  Diagnosis Date  . Diabetes mellitus without complication ([place])   . Hypertension     History reviewed. No pertinent surgical history.  No Known Allergies   Outpatient Medications Prior to Visit  Medication Sig Dispense Refill  . aspirin EC 81 MG tablet Take 81 mg by mouth daily.    Marland Kitchen amLODipine (NORVASC) 5 MG tablet Take 1 tablet (5 mg total) by mouth daily. 30 tablet 0  . atorvastatin (LIPITOR) 40 MG tablet Take 1 tablet (40 mg total) by mouth daily. For cholesterol 90 tablet 3  . lisinopril (PRINIVIL,ZESTRIL) 40 MG tablet TAKE 1 TABLET BY MOUTH DAILY. 30 tablet 0  . metFORMIN (GLUCOPHAGE-XR) 500 MG 24 hr tablet TAKE 1 TABLET BY MOUTH DAILY WITH BREAKFAST. 30 tablet 0   No facility-administered medications prior to visit.     ROS Review of Systems  Constitutional: Negative for activity change and appetite change.  HENT: Negative for sinus pressure and sore throat.   Eyes: Negative for visual disturbance.  Respiratory: Negative for cough,  chest tightness and shortness of breath.   Cardiovascular: Negative for chest pain and leg swelling.  Gastrointestinal: Negative for abdominal distention, abdominal pain, constipation and diarrhea.  Endocrine: Negative.   Genitourinary: Negative for dysuria.  Musculoskeletal:       See hpi  Skin: Negative for rash.  Allergic/Immunologic: Negative.   Neurological: Negative for weakness, light-headedness and numbness.  Psychiatric/Behavioral: Negative for dysphoric mood and suicidal ideas.    Objective:  BP 130/75 (BP Location: Right Arm, Patient Position: Sitting, Cuff Size: Large)   Pulse 93   Temp 98.2 F (36.8 C) (Oral)   Ht 6\' 3"  (1.905 m)   Wt 266 lb (120.7 kg)   SpO2 90%   BMI 33.25 kg/m   BP/Weight 11/16/2015 05/10/2015 123456  Systolic BP [id number]  Diastolic BP 75 99 98  Wt. (Lbs) 266 - -  BMI 33.25 - -      Physical Exam  Constitutional: He is oriented to person, place, and time. He appears well-developed and well-nourished.  Cardiovascular: Normal rate, normal heart sounds and intact distal pulses.   No murmur heard. Pulmonary/Chest: Effort normal and breath sounds normal. He has no wheezes. He has no rales. He exhibits no tenderness.  Abdominal: Soft. Bowel sounds are normal. He exhibits no distension and no mass. There is no tenderness.  Musculoskeletal: He exhibits edema (Posterior right foot with mild edema and mild tenderness, no erythema).  Neurological: He is  alert and oriented to person, place, and time.    Lab Results  Component Value Date   HGBA1C 6.1 11/16/2015    CMP Latest Ref Rng & Units 04/11/2015 09/11/2013 11/29/2006  Glucose 65 - 99 mg/dL 176(H) 122(H) 110(H)  BUN 7 - 25 mg/dL 14 10 -  Creatinine 0.70 - 1.33 mg/dL 0.66(L) 0.69 -  Sodium 135 - 146 mmol/L 140 139 137  Potassium 3.5 - 5.3 mmol/L 4.0 4.6 4.1  Chloride 98 - 110 mmol/L 102 100 -  CO2 20 - 31 mmol/L 27 27 -  Calcium 8.6 - 10.3 mg/dL 8.7 9.3 -  Total Protein 6.1 - 8.1  g/dL 6.5 6.9 -  Total Bilirubin 0.2 - 1.2 mg/dL 0.5 0.6 -  Alkaline Phos 40 - 115 U/L 43 46 -  AST 10 - 35 U/L 34 18 -  ALT 9 - 46 U/L 33 26 -    Lipid Panel     Component Value Date/Time   CHOL 215 (H) 04/11/2015 1002   TRIG 461 (H) 04/11/2015 1002   HDL 35 (L) 04/11/2015 1002   CHOLHDL 6.1 (H) 04/11/2015 1002   VLDL NOT CALC 04/11/2015 1002   LDLCALC NOT CALC 04/11/2015 1002    Assessment & Plan:   1. Other hyperlipidemia Uncontrolled We'll send off a repeat panel - atorvastatin (LIPITOR) 40 MG tablet; Take 1 tablet (40 mg total) by mouth daily. For cholesterol  Dispense: 30 tablet; Refill: 5  2. Type 2 diabetes mellitus without complication, without long-term current use of insulin (HCC) Controlled with A1c of 6.1 - Glucose (CBG) - HgB A1c - metFORMIN (GLUCOPHAGE-XR) 500 MG 24 hr tablet; Take 1 tablet (500 mg total) by mouth daily with breakfast.  Dispense: 30 tablet; Refill: 5 - Microalbumin / creatinine urine ratio; Future - COMPLETE METABOLIC PANEL WITH GFR; Future - Lipid panel; Future  3. Screening for viral disease - HIV antibody; Future - Hepatitis C Antibody; Future  4. Acute idiopathic gout of right foot Flares are not frequent and so I will hold off on placing him on allopurinol - colchicine 0.6 MG tablet; Take 2 tablets (1.2 mg) it at the onset of a gout attack, may repeat 1 tablet (0.6 mg) in 2 hours if symptoms persist.  Dispense: 30 tablet; Refill: 1   Meds ordered this encounter  Medications  . amLODipine (NORVASC) 5 MG tablet    Sig: Take 1 tablet (5 mg total) by mouth daily.    Dispense:  30 tablet    Refill:  5  . atorvastatin (LIPITOR) 40 MG tablet    Sig: Take 1 tablet (40 mg total) by mouth daily. For cholesterol    Dispense:  30 tablet    Refill:  5  . lisinopril (PRINIVIL,ZESTRIL) 40 MG tablet    Sig: Take 1 tablet (40 mg total) by mouth daily.    Dispense:  30 tablet    Refill:  5  . metFORMIN (GLUCOPHAGE-XR) 500 MG 24 hr tablet     Sig: Take 1 tablet (500 mg total) by mouth daily with breakfast.    Dispense:  30 tablet    Refill:  5  . colchicine 0.6 MG tablet    Sig: Take 2 tablets (1.2 mg) it at the onset of a gout attack, may repeat 1 tablet (0.6 mg) in 2 hours if symptoms persist.    Dispense:  30 tablet    Refill:  1    Follow-up: Return in about 3 months (around 02/16/2016) for  Follow-up on diabetes mellitus.   Arnoldo Morale MD

## 2015-11-21 ENCOUNTER — Ambulatory Visit: Payer: Self-pay | Attending: Family Medicine

## 2015-11-21 DIAGNOSIS — E119 Type 2 diabetes mellitus without complications: Secondary | ICD-10-CM | POA: Insufficient documentation

## 2015-11-21 DIAGNOSIS — Z1159 Encounter for screening for other viral diseases: Secondary | ICD-10-CM | POA: Insufficient documentation

## 2015-11-21 LAB — HEPATITIS C ANTIBODY: HCV Ab: NEGATIVE

## 2015-11-21 LAB — HIV ANTIBODY (ROUTINE TESTING W REFLEX): HIV 1&2 Ab, 4th Generation: NONREACTIVE

## 2015-11-21 NOTE — Progress Notes (Signed)
Patient here for lab visit only 

## 2015-11-22 ENCOUNTER — Other Ambulatory Visit: Payer: Self-pay | Admitting: Family Medicine

## 2015-11-22 LAB — COMPLETE METABOLIC PANEL WITH GFR
ALBUMIN: 3.7 g/dL (ref 3.6–5.1)
ALK PHOS: 40 U/L (ref 40–115)
ALT: 20 U/L (ref 9–46)
AST: 23 U/L (ref 10–35)
BUN: 13 mg/dL (ref 7–25)
CALCIUM: 9.2 mg/dL (ref 8.6–10.3)
CO2: 29 mmol/L (ref 20–31)
Chloride: 105 mmol/L (ref 98–110)
Creat: 0.64 mg/dL — ABNORMAL LOW (ref 0.70–1.33)
GFR, Est Non African American: >89 mL/min (ref >=60)
Glucose, Bld: 102 mg/dL — ABNORMAL HIGH (ref 65–99)
POTASSIUM: 4.1 mmol/L (ref 3.5–5.3)
Sodium: 145 mmol/L (ref 135–146)
Total Bilirubin: 0.5 mg/dL (ref 0.2–1.2)
Total Protein: 6.2 g/dL (ref 6.1–8.1)

## 2015-11-22 LAB — MICROALBUMIN / CREATININE URINE RATIO
CREATININE, URINE: 166 mg/dL (ref 20–370)
MICROALB UR: 2.2 mg/dL
Microalb Creat Ratio: 13 mcg/mg creat (ref <30)

## 2015-11-22 LAB — LIPID PANEL
CHOL/HDL RATIO: 2.8 ratio (ref <=5.0)
CHOLESTEROL: 143 mg/dL (ref 125–200)
HDL: 51 mg/dL (ref >=40)
TRIGLYCERIDES: 461 mg/dL — AB (ref <150)

## 2015-11-22 MED ORDER — FENOFIBRATE 160 MG PO TABS: 160.0000 mg | ORAL_TABLET | Freq: Every day | ORAL | 3 refills | Status: DC

## 2015-11-22 MED FILL — FENOFIBRATE 160 MG TABLET: 160 | 30 days supply | Qty: 30 | Fill #0

## 2015-11-23 ENCOUNTER — Other Ambulatory Visit: Payer: Self-pay | Admitting: Internal Medicine

## 2015-11-23 DIAGNOSIS — E119 Type 2 diabetes mellitus without complications: Secondary | ICD-10-CM

## 2015-11-24 ENCOUNTER — Telehealth: Payer: Self-pay

## 2015-11-24 NOTE — Telephone Encounter (Signed)
Writer called patient per Dr. Jarold Song.  Patient stated understanding regarding lab results and will pick up prescribed medication at the pharmacy.

## 2015-11-24 NOTE — Telephone Encounter (Signed)
-----   Message from Arnoldo Morale, MD sent at 11/22/2015  1:57 PM EDT ----- Kidney and liver function and normal, he is negative for hepatitis C and HIV. Total cholesterol is normal however his triglycerides are slightly elevated and so I have sent a prescription for fenofibrate to his pharmacy.

## 2015-12-07 MED FILL — LISINOPRIL 40 MG TABLET: 40 | 30 days supply | Qty: 30 | Fill #0

## 2015-12-07 MED FILL — METFORMIN HCL ER 500 MG TAB: 500 | 30 days supply | Qty: 30 | Fill #0

## 2016-01-13 MED FILL — LISINOPRIL 40 MG TABLET: 40 | 30 days supply | Qty: 30 | Fill #1

## 2016-01-13 MED FILL — FENOFIBRATE 160 MG TABLET: 160 | 30 days supply | Qty: 30 | Fill #1

## 2016-01-13 MED FILL — COLCHICINE 0.6 MG TABLET: 0.6 | 15 days supply | Qty: 30 | Fill #1

## 2016-01-13 MED FILL — AMLODIPINE BESYLATE 5 MG TA: 5 | 30 days supply | Qty: 30 | Fill #1

## 2016-01-13 MED FILL — ATORVASTATIN 40 MG TABLET: 40 | 30 days supply | Qty: 30 | Fill #1

## 2016-01-13 MED FILL — METFORMIN HCL ER 500 MG TAB: 500 | 30 days supply | Qty: 30 | Fill #1

## 2016-02-14 ENCOUNTER — Other Ambulatory Visit: Payer: Self-pay | Admitting: Family Medicine

## 2016-02-14 DIAGNOSIS — M10071 Idiopathic gout, right ankle and foot: Secondary | ICD-10-CM

## 2016-02-14 MED FILL — COLCHICINE 0.6 MG TABLET: 0.6 | 15 days supply | Qty: 30 | Fill #0

## 2016-02-14 MED FILL — METFORMIN HCL ER 500 MG TAB: 500 | 30 days supply | Qty: 30 | Fill #2

## 2016-02-14 MED FILL — ATORVASTATIN 40 MG TABLET: 40 | 30 days supply | Qty: 30 | Fill #2

## 2016-02-14 MED FILL — AMLODIPINE BESYLATE 5 MG TA: 5 | 30 days supply | Qty: 30 | Fill #2

## 2016-02-14 MED FILL — FENOFIBRATE 160 MG TABLET: 160 | 30 days supply | Qty: 30 | Fill #2

## 2016-02-14 MED FILL — LISINOPRIL 40 MG TABLET: 40 | 30 days supply | Qty: 30 | Fill #2

## 2016-03-19 MED FILL — COLCHICINE 0.6 MG TABLET: 0.6 | 15 days supply | Qty: 30 | Fill #1

## 2016-03-19 MED FILL — FENOFIBRATE 160 MG TABLET: 160 | 30 days supply | Qty: 30 | Fill #3

## 2016-03-19 MED FILL — AMLODIPINE BESYLATE 5 MG TA: 5 | 30 days supply | Qty: 30 | Fill #3

## 2016-03-19 MED FILL — LISINOPRIL 40 MG TABLET: 40 | 30 days supply | Qty: 30 | Fill #3

## 2016-03-19 MED FILL — ATORVASTATIN 40 MG TABLET: 40 | 30 days supply | Qty: 30 | Fill #3

## 2016-03-19 MED FILL — METFORMIN HCL ER 500 MG TAB: 500 | 30 days supply | Qty: 30 | Fill #3

## 2016-04-20 ENCOUNTER — Other Ambulatory Visit: Payer: Self-pay | Admitting: Family Medicine

## 2016-04-20 DIAGNOSIS — M10071 Idiopathic gout, right ankle and foot: Secondary | ICD-10-CM

## 2016-04-20 MED FILL — AMLODIPINE BESYLATE 5 MG TA: 5 | 30 days supply | Qty: 30 | Fill #4

## 2016-04-20 MED FILL — METFORMIN HCL ER 500 MG TAB: 500 | 30 days supply | Qty: 30 | Fill #4

## 2016-04-20 MED FILL — ?ATORVASTATIN 40MG TABLET: 40 | 30 days supply | Qty: 30 | Fill #4

## 2016-04-20 MED FILL — LISINOPRIL 40 MG TABLET: 40 | 30 days supply | Qty: 30 | Fill #4

## 2016-04-23 MED FILL — COLCHICINE 0.6 MG TABLET: 0.6 | 10 days supply | Qty: 30 | Fill #0

## 2016-04-23 MED FILL — FENOFIBRATE 160 MG TABLET: 160 | 30 days supply | Qty: 30 | Fill #0

## 2016-05-23 ENCOUNTER — Other Ambulatory Visit: Payer: Self-pay | Admitting: Family Medicine

## 2016-05-23 MED FILL — LISINOPRIL 40 MG TABLET: 40 | 30 days supply | Qty: 30 | Fill #5

## 2016-05-23 MED FILL — METFORMIN HCL ER 500 MG TAB: 500 | 30 days supply | Qty: 30 | Fill #5

## 2016-05-23 MED FILL — ?ATORVASTATIN 40MG TABLET: 40 | 30 days supply | Qty: 30 | Fill #5

## 2016-05-23 MED FILL — AMLODIPINE BESYLATE 5 MG TA: 5 | 30 days supply | Qty: 30 | Fill #5

## 2016-05-24 ENCOUNTER — Other Ambulatory Visit: Payer: Self-pay | Admitting: Family Medicine

## 2016-05-24 DIAGNOSIS — M10071 Idiopathic gout, right ankle and foot: Secondary | ICD-10-CM

## 2016-05-28 MED FILL — COLCHICINE 0.6 MG TABLET: 0.6 | 10 days supply | Qty: 30 | Fill #0

## 2016-05-30 MED FILL — FENOFIBRATE 160 MG TABLET: 160 | 30 days supply | Qty: 30 | Fill #0

## 2016-06-18 ENCOUNTER — Ambulatory Visit: Payer: Self-pay | Attending: Family Medicine

## 2016-06-27 ENCOUNTER — Other Ambulatory Visit: Payer: Self-pay | Admitting: Family Medicine

## 2016-06-27 DIAGNOSIS — E119 Type 2 diabetes mellitus without complications: Secondary | ICD-10-CM

## 2016-06-27 DIAGNOSIS — E7849 Other hyperlipidemia: Secondary | ICD-10-CM

## 2016-06-27 DIAGNOSIS — M10071 Idiopathic gout, right ankle and foot: Secondary | ICD-10-CM

## 2016-07-02 ENCOUNTER — Other Ambulatory Visit: Payer: Self-pay | Admitting: Family Medicine

## 2016-07-02 DIAGNOSIS — E119 Type 2 diabetes mellitus without complications: Secondary | ICD-10-CM

## 2016-07-02 DIAGNOSIS — E7849 Other hyperlipidemia: Secondary | ICD-10-CM

## 2016-07-02 DIAGNOSIS — M10071 Idiopathic gout, right ankle and foot: Secondary | ICD-10-CM

## 2016-07-03 ENCOUNTER — Other Ambulatory Visit: Payer: Self-pay | Admitting: *Deleted

## 2016-07-03 DIAGNOSIS — M10071 Idiopathic gout, right ankle and foot: Secondary | ICD-10-CM

## 2016-07-03 MED ORDER — COLCHICINE 0.6 MG PO TABS: ORAL_TABLET | ORAL | 3 refills | Status: DC

## 2016-07-03 NOTE — Telephone Encounter (Signed)
PRINTED FOR PASS PROGRAM 

## 2016-07-04 ENCOUNTER — Telehealth: Payer: Self-pay | Admitting: Family Medicine

## 2016-07-04 DIAGNOSIS — E119 Type 2 diabetes mellitus without complications: Secondary | ICD-10-CM

## 2016-07-04 DIAGNOSIS — M10071 Idiopathic gout, right ankle and foot: Secondary | ICD-10-CM

## 2016-07-04 DIAGNOSIS — E7849 Other hyperlipidemia: Secondary | ICD-10-CM

## 2016-07-04 NOTE — Telephone Encounter (Signed)
Pt. Called requesting medication refill on all his current medication. Pt. Has scheduled appt. For 07/06/16.  Please f/u

## 2016-07-05 MED ORDER — AMLODIPINE BESYLATE 5 MG PO TABS: 5.0000 mg | ORAL_TABLET | Freq: Every day | ORAL | 5 refills | Status: DC

## 2016-07-05 MED ORDER — COLCHICINE 0.6 MG PO TABS
ORAL_TABLET | ORAL | 3 refills | Status: DC
Start: 2016-07-05 — End: 2017-01-24

## 2016-07-05 MED ORDER — ATORVASTATIN CALCIUM 40 MG PO TABS: 40.0000 mg | ORAL_TABLET | Freq: Every day | ORAL | 5 refills | Status: DC

## 2016-07-05 MED ORDER — ASPIRIN EC 81 MG PO TBEC: 81.0000 mg | DELAYED_RELEASE_TABLET | Freq: Every day | ORAL | 5 refills | Status: DC

## 2016-07-05 MED ORDER — METFORMIN HCL ER 500 MG PO TB24: 500.0000 mg | ORAL_TABLET | Freq: Every day | ORAL | 5 refills | Status: DC

## 2016-07-05 MED ORDER — LISINOPRIL 40 MG PO TABS: 40.0000 mg | ORAL_TABLET | Freq: Every day | ORAL | 5 refills | Status: DC

## 2016-07-05 MED FILL — ?AMLODIPINE BESYLATE 5 MG T: 5 | 30 days supply | Qty: 30 | Fill #0

## 2016-07-05 MED FILL — LISINOPRIL 40 MG TABLET: 40 | 30 days supply | Qty: 30 | Fill #0

## 2016-07-05 MED FILL — ATORVASTATIN 40 MG TABLET: 40 | 30 days supply | Qty: 30 | Fill #0

## 2016-07-05 MED FILL — METFORMIN HCL ER 500 MG TAB: 500 | 30 days supply | Qty: 30 | Fill #0

## 2016-07-05 NOTE — Telephone Encounter (Signed)
Refilled

## 2016-07-06 ENCOUNTER — Encounter: Payer: Self-pay | Admitting: Family Medicine

## 2016-07-06 ENCOUNTER — Ambulatory Visit: Payer: Self-pay | Attending: Family Medicine | Admitting: Family Medicine

## 2016-07-06 VITALS — BP 178/92 | HR 92 | Temp 98.1°F | Resp 18 | Ht 75.0 in | Wt 264.0 lb

## 2016-07-06 DIAGNOSIS — M1A0710 Idiopathic chronic gout, right ankle and foot, without tophus (tophi): Secondary | ICD-10-CM

## 2016-07-06 DIAGNOSIS — E785 Hyperlipidemia, unspecified: Secondary | ICD-10-CM | POA: Insufficient documentation

## 2016-07-06 DIAGNOSIS — Z7982 Long term (current) use of aspirin: Secondary | ICD-10-CM | POA: Insufficient documentation

## 2016-07-06 DIAGNOSIS — E119 Type 2 diabetes mellitus without complications: Secondary | ICD-10-CM

## 2016-07-06 DIAGNOSIS — E781 Pure hyperglyceridemia: Secondary | ICD-10-CM

## 2016-07-06 DIAGNOSIS — Z7984 Long term (current) use of oral hypoglycemic drugs: Secondary | ICD-10-CM | POA: Insufficient documentation

## 2016-07-06 DIAGNOSIS — Z1211 Encounter for screening for malignant neoplasm of colon: Secondary | ICD-10-CM

## 2016-07-06 DIAGNOSIS — I10 Essential (primary) hypertension: Secondary | ICD-10-CM

## 2016-07-06 DIAGNOSIS — Z79899 Other long term (current) drug therapy: Secondary | ICD-10-CM | POA: Insufficient documentation

## 2016-07-06 LAB — POCT GLYCOSYLATED HEMOGLOBIN (HGB A1C): Hemoglobin A1C: 6.6

## 2016-07-06 LAB — POCT CBG (FASTING - GLUCOSE)-MANUAL ENTRY: GLUCOSE FASTING, POC: 181 mg/dL — AB (ref 70–99)

## 2016-07-06 MED FILL — COLCHICINE 0.6 MG TABLET: 0.6 | 30 days supply | Qty: 30 | Fill #0

## 2016-07-06 NOTE — Patient Instructions (Signed)

## 2016-07-06 NOTE — Progress Notes (Signed)
F/U DM  Pain scale # 7 Hx of arthritis  No medication x 7 days  Not checking glucose at home Tobacco user 1ppday  Social ETOH  No suicidal thoughts in the past two week

## 2016-07-06 NOTE — Progress Notes (Signed)
Subjective:  Patient ID: Donald Bender, male    DOB: 05-10-1960  Age: 56 y.o. MRN: [id number]  CC: Follow-up visit  HPI Donald Bender is a 56 year old male with a history of type 2 diabetes mellitus (A1c 6.6), hypertension, hyperlipidemia, gout who presents today for a follow-up visit; his last visit to the clinic was 8 months ago and he endorses being out of his medications for the last 1 week.  Has been compliant with a diabetic diet, low-cholesterol and low-sodium diet however he does not exercise due to his tight work schedule. Denies hypoglycemia or numbness of extremity, denies visual complaints; is not up-to-date on annual eye exam.  He does have a history of gout and has been on colchicine which he takes daily; he denies any recent flares.  He has no acute concerns today.  Past Medical History:  Diagnosis Date  . Diabetes mellitus without complication ([place])   . Hypertension     No past surgical history on file.  No Known Allergies   Outpatient Medications Prior to Visit  Medication Sig Dispense Refill  . aspirin EC 81 MG tablet Take 1 tablet (81 mg total) by mouth daily. 30 tablet 5  . amLODipine (NORVASC) 5 MG tablet Take 1 tablet (5 mg total) by mouth daily. 30 tablet 5  . atorvastatin (LIPITOR) 40 MG tablet Take 1 tablet (40 mg total) by mouth daily. For cholesterol 30 tablet 5  . colchicine 0.6 MG tablet TAKE 2 TABLETS BY MOUTH AT THE ONSET OF A GOUT ATTACK, MAY REPEAT 1 TABLET IN 2 HOURS IF SYMPTOMS PERSIST. 90 tablet 3  . fenofibrate 160 MG tablet TAKE 1 TABLET BY MOUTH DAILY. 30 tablet 0  . lisinopril (PRINIVIL,ZESTRIL) 40 MG tablet Take 1 tablet (40 mg total) by mouth daily. 30 tablet 5  . metFORMIN (GLUCOPHAGE-XR) 500 MG 24 hr tablet Take 1 tablet (500 mg total) by mouth daily with breakfast. 30 tablet 5   No facility-administered medications prior to visit.     ROS Review of Systems  Constitutional: Negative for activity change and appetite change.    HENT: Negative for sinus pressure and sore throat.   Eyes: Negative for visual disturbance.  Respiratory: Negative for cough, chest tightness and shortness of breath.   Cardiovascular: Negative for chest pain and leg swelling.  Gastrointestinal: Negative for abdominal distention, abdominal pain, constipation and diarrhea.  Endocrine: Negative.   Genitourinary: Negative for dysuria.  Musculoskeletal: Negative for joint swelling and myalgias.  Skin: Negative for rash.  Allergic/Immunologic: Negative.   Neurological: Negative for weakness, light-headedness and numbness.  Psychiatric/Behavioral: Negative for dysphoric mood and suicidal ideas.    Objective:  BP (!) 178/92 (BP Location: Right Arm, Patient Position: Sitting, Cuff Size: Large)   Pulse 92   Temp 98.1 F (36.7 C) (Oral)   Resp 18   Ht 6' 3"  (1.905 m)   Wt 264 lb (119.7 kg)   BMI 33.00 kg/m   BP/Weight 07/06/2016 11/16/2015 05/07/8117  Systolic BP 147 829 562  Diastolic BP 92 75 99  Wt. (Lbs) 264 266 -  BMI 33 33.25 -      Physical Exam  Constitutional: He is oriented to person, place, and time. He appears well-developed and well-nourished.  Cardiovascular: Normal rate, normal heart sounds and intact distal pulses.   No murmur heard. Pulmonary/Chest: Effort normal and breath sounds normal. He has no wheezes. He has no rales. He exhibits no tenderness.  Abdominal: Soft. Bowel sounds are normal. He  exhibits no distension and no mass. There is no tenderness.  Musculoskeletal: Normal range of motion.  Neurological: He is alert and oriented to person, place, and time.  Skin: Skin is warm and dry.  Psychiatric: He has a normal mood and affect.     CMP Latest Ref Rng & Units 11/21/2015 04/11/2015 09/11/2013  Glucose 65 - 99 mg/dL 102(H) 176(H) 122(H)  BUN 7 - 25 mg/dL 13 14 10   Creatinine 0.70 - 1.33 mg/dL 0.64(L) 0.66(L) 0.69  Sodium 135 - 146 mmol/L 145 140 139  Potassium 3.5 - 5.3 mmol/L 4.1 4.0 4.6  Chloride 98 -  110 mmol/L 105 102 100  CO2 20 - 31 mmol/L 29 27 27   Calcium 8.6 - 10.3 mg/dL 9.2 8.7 9.3  Total Protein 6.1 - 8.1 g/dL 6.2 6.5 6.9  Total Bilirubin 0.2 - 1.2 mg/dL 0.5 0.5 0.6  Alkaline Phos 40 - 115 U/L 40 43 46  AST 10 - 35 U/L 23 34 18  ALT 9 - 46 U/L 20 33 26   Lipid Panel     Component Value Date/Time   CHOL 143 11/21/2015 0915   TRIG 461 (H) 11/21/2015 0915   HDL 51 11/21/2015 0915   CHOLHDL 2.8 11/21/2015 0915   VLDL NOT CALC 11/21/2015 0915   LDLCALC NOT CALC 11/21/2015 0915     Lab Results  Component Value Date   HGBA1C 6.6 07/06/2016    Assessment & Plan:   1. Type 2 diabetes mellitus without complication, without long-term current use of insulin (HCC) Controlled with A1c of 6.6 Continue metformin - POCT glycosylated hemoglobin (Hb A1C) - Glucose (CBG), Fasting - CMP14+EGFR  2. chronic idiopathic gout of right foot No acute flares Continue colchicine  3. Essential hypertension Uncontrolled due to running out of her antihypertensives which I have refilled  4. Hypertriglyceridemia Uncontrolled Continue atorvastatin, low cholesterol diet - Lipid panel  5. HCM Referred to GI for colonoscopy  No orders of the defined types were placed in this encounter.   Follow-up: Return in about 3 months (around 10/06/2016) for Follow-up of chronic medical conditions.   Arnoldo Morale MD

## 2016-07-09 LAB — CMP14+EGFR
A/G RATIO: 1.6 (ref 1.2–2.2)
ALT: 28 IU/L (ref 0–44)
AST: 26 IU/L (ref 0–40)
Albumin: 4.2 g/dL (ref 3.5–5.5)
Alkaline Phosphatase: 45 IU/L (ref 39–117)
BUN/Creatinine Ratio: 19 (ref 9–20)
BUN: 15 mg/dL (ref 6–24)
Bilirubin Total: 0.4 mg/dL (ref 0.0–1.2)
CO2: 23 mmol/L (ref 18–29)
Calcium: 9.9 mg/dL (ref 8.7–10.2)
Chloride: 99 mmol/L (ref 96–106)
Creatinine, Ser: 0.77 mg/dL (ref 0.76–1.27)
GFR calc Af Amer: 117 mL/min/{1.73_m2} (ref >59)
GFR calc non Af Amer: 101 mL/min/{1.73_m2} (ref >59)
GLOBULIN, TOTAL: 2.7 g/dL (ref 1.5–4.5)
Glucose: 168 mg/dL — ABNORMAL HIGH (ref 65–99)
Potassium: 4.2 mmol/L (ref 3.5–5.2)
SODIUM: 141 mmol/L (ref 134–144)
Total Protein: 6.9 g/dL (ref 6.0–8.5)

## 2016-07-09 LAB — LIPID PANEL
CHOL/HDL RATIO: 4.7 ratio (ref 0.0–5.0)
CHOLESTEROL TOTAL: 217 mg/dL — AB (ref 100–199)
HDL: 46 mg/dL (ref >39)
TRIGLYCERIDES: 566 mg/dL — AB (ref 0–149)

## 2016-08-06 MED FILL — METFORMIN HCL ER 500 MG TAB: 500 | 30 days supply | Qty: 30 | Fill #1

## 2016-08-06 MED FILL — ?ATORVASTATIN 40MG TABLET: 40 | 30 days supply | Qty: 30 | Fill #1

## 2016-08-06 MED FILL — LISINOPRIL 40 MG TABLET: 40 | 30 days supply | Qty: 30 | Fill #1

## 2016-08-06 MED FILL — AMLODIPINE BESYLATE 5 MG TA: 5 | 30 days supply | Qty: 30 | Fill #1

## 2016-08-08 ENCOUNTER — Other Ambulatory Visit: Payer: Self-pay | Admitting: Family Medicine

## 2016-08-15 ENCOUNTER — Encounter: Payer: Self-pay | Admitting: Family Medicine

## 2016-09-07 MED FILL — LISINOPRIL 40 MG TABLET: 40 | 30 days supply | Qty: 30 | Fill #2

## 2016-09-07 MED FILL — METFORMIN HCL ER 500 MG TAB: 500 | 30 days supply | Qty: 30 | Fill #2

## 2016-09-07 MED FILL — ?ATORVASTATIN 40MG TABLET: 40 | 30 days supply | Qty: 30 | Fill #2

## 2016-09-07 MED FILL — AMLODIPINE BESYLATE 5 MG TA: 5 | 30 days supply | Qty: 30 | Fill #2

## 2016-10-03 LAB — HM DIABETES EYE EXAM

## 2016-10-10 MED FILL — ?ATORVASTATIN 40MG TABLET: 40 | 30 days supply | Qty: 30 | Fill #3

## 2016-10-10 MED FILL — LISINOPRIL 40 MG TABLET: 40 | 30 days supply | Qty: 30 | Fill #3

## 2016-10-10 MED FILL — AMLODIPINE BESYLATE 5 MG TA: 5 | 30 days supply | Qty: 30 | Fill #3

## 2016-10-10 MED FILL — METFORMIN HCL ER 500 MG TAB: 500 | 30 days supply | Qty: 30 | Fill #3

## 2016-11-14 MED FILL — LISINOPRIL 40 MG TABLET: 40 | 30 days supply | Qty: 30 | Fill #4

## 2016-11-14 MED FILL — ?ATORVASTATIN 40MG TABLET: 40 | 30 days supply | Qty: 30 | Fill #4

## 2016-11-14 MED FILL — METFORMIN HCL ER 500 MG TAB: 500 | 30 days supply | Qty: 30 | Fill #4

## 2016-11-14 MED FILL — AMLODIPINE BESYLATE 5 MG TA: 5 | 30 days supply | Qty: 30 | Fill #4

## 2016-12-17 ENCOUNTER — Other Ambulatory Visit: Payer: Self-pay | Admitting: Family Medicine

## 2016-12-17 DIAGNOSIS — E7849 Other hyperlipidemia: Secondary | ICD-10-CM

## 2016-12-17 DIAGNOSIS — E119 Type 2 diabetes mellitus without complications: Secondary | ICD-10-CM

## 2016-12-17 MED FILL — ?ATORVASTATIN 40MG TABLET: 40 | 30 days supply | Qty: 30 | Fill #5

## 2016-12-17 MED FILL — METFORMIN HCL ER 500 MG TAB: 500 | 30 days supply | Qty: 30 | Fill #5

## 2016-12-17 MED FILL — AMLODIPINE BESYLATE 5 MG TA: 5 | 30 days supply | Qty: 30 | Fill #5

## 2016-12-17 MED FILL — LISINOPRIL 40 MG TAB: 40 | 30 days supply | Qty: 30 | Fill #5

## 2017-01-14 ENCOUNTER — Other Ambulatory Visit: Payer: Self-pay | Admitting: Family Medicine

## 2017-01-14 DIAGNOSIS — E119 Type 2 diabetes mellitus without complications: Secondary | ICD-10-CM

## 2017-01-14 DIAGNOSIS — E7849 Other hyperlipidemia: Secondary | ICD-10-CM

## 2017-01-15 ENCOUNTER — Telehealth: Payer: Self-pay | Admitting: Family Medicine

## 2017-01-15 DIAGNOSIS — E119 Type 2 diabetes mellitus without complications: Secondary | ICD-10-CM

## 2017-01-15 NOTE — Telephone Encounter (Signed)
Pt called to request a refill for metFORMIN (GLUCOPHAGE-XR) 500 MG 24 hr tablet lisinopril (PRINIVIL,ZESTRIL) 40 MG tablet  Pt want you to sent it [facility] pharmacy, please if you auth or not call the pt

## 2017-01-16 MED ORDER — LISINOPRIL 40 MG PO TABS: 40.0000 mg | ORAL_TABLET | Freq: Every day | ORAL | 0 refills | Status: DC

## 2017-01-16 MED ORDER — METFORMIN HCL ER 500 MG PO TB24: 500.0000 mg | ORAL_TABLET | Freq: Every day | ORAL | 0 refills | Status: DC

## 2017-01-16 MED FILL — METFORMIN HCL ER 500 MG TAB: 500 | 30 days supply | Qty: 30 | Fill #0

## 2017-01-16 MED FILL — LISINOPRIL 40 MG TAB: 40 | 30 days supply | Qty: 30 | Fill #0

## 2017-01-16 NOTE — Telephone Encounter (Signed)
Refilled

## 2017-01-17 NOTE — Telephone Encounter (Signed)
Pt has been informed of the refill

## 2017-01-24 ENCOUNTER — Ambulatory Visit: Payer: Self-pay | Attending: Family Medicine | Admitting: Physician Assistant

## 2017-01-24 VITALS — BP 189/91 | HR 85 | Temp 98.2°F | Resp 18 | Ht 75.0 in | Wt 271.0 lb

## 2017-01-24 DIAGNOSIS — I10 Essential (primary) hypertension: Secondary | ICD-10-CM | POA: Insufficient documentation

## 2017-01-24 DIAGNOSIS — E7849 Other hyperlipidemia: Secondary | ICD-10-CM | POA: Insufficient documentation

## 2017-01-24 DIAGNOSIS — Z7982 Long term (current) use of aspirin: Secondary | ICD-10-CM | POA: Insufficient documentation

## 2017-01-24 DIAGNOSIS — E1110 Type 2 diabetes mellitus with ketoacidosis without coma: Secondary | ICD-10-CM | POA: Insufficient documentation

## 2017-01-24 DIAGNOSIS — F17210 Nicotine dependence, cigarettes, uncomplicated: Secondary | ICD-10-CM | POA: Insufficient documentation

## 2017-01-24 DIAGNOSIS — E119 Type 2 diabetes mellitus without complications: Secondary | ICD-10-CM

## 2017-01-24 DIAGNOSIS — M10071 Idiopathic gout, right ankle and foot: Secondary | ICD-10-CM | POA: Insufficient documentation

## 2017-01-24 DIAGNOSIS — Z7984 Long term (current) use of oral hypoglycemic drugs: Secondary | ICD-10-CM | POA: Insufficient documentation

## 2017-01-24 DIAGNOSIS — Z23 Encounter for immunization: Secondary | ICD-10-CM

## 2017-01-24 DIAGNOSIS — Z79899 Other long term (current) drug therapy: Secondary | ICD-10-CM | POA: Insufficient documentation

## 2017-01-24 LAB — POCT GLYCOSYLATED HEMOGLOBIN (HGB A1C): HEMOGLOBIN A1C: 6.1

## 2017-01-24 LAB — GLUCOSE, POCT (MANUAL RESULT ENTRY): POC Glucose: 139 mg/dl — AB (ref 70–99)

## 2017-01-24 MED ORDER — LISINOPRIL 40 MG PO TABS: 40.0000 mg | ORAL_TABLET | Freq: Every day | ORAL | 5 refills | Status: DC

## 2017-01-24 MED ORDER — ASPIRIN EC 81 MG PO TBEC: 81.0000 mg | DELAYED_RELEASE_TABLET | Freq: Every day | ORAL | 5 refills | Status: AC

## 2017-01-24 MED ORDER — FENOFIBRATE 160 MG PO TABS: 160.0000 mg | ORAL_TABLET | Freq: Every day | ORAL | 5 refills | Status: DC

## 2017-01-24 MED ORDER — ATORVASTATIN CALCIUM 40 MG PO TABS: 40.0000 mg | ORAL_TABLET | Freq: Every day | ORAL | 5 refills | Status: DC

## 2017-01-24 MED ORDER — AMLODIPINE BESYLATE 5 MG PO TABS: 5.0000 mg | ORAL_TABLET | Freq: Every day | ORAL | 5 refills | Status: DC

## 2017-01-24 MED ORDER — METFORMIN HCL ER 500 MG PO TB24: 500.0000 mg | ORAL_TABLET | Freq: Every day | ORAL | 5 refills | Status: DC

## 2017-01-24 MED ORDER — COLCHICINE 0.6 MG PO TABS: ORAL_TABLET | ORAL | 3 refills | Status: DC

## 2017-01-24 MED FILL — COLCHICINE 0.6 MG TABS: 0.6 | 6 days supply | Qty: 20 | Fill #0

## 2017-01-24 MED FILL — AMLODIPINE BESYLATE 5 MG TA: 5 | 30 days supply | Qty: 30 | Fill #0

## 2017-01-24 MED FILL — FENOFIBRATE 160 MG TABLET: 160 | 30 days supply | Qty: 30 | Fill #0

## 2017-01-24 MED FILL — ATORVASTATIN 40 MG TABLET: 40 | 30 days supply | Qty: 30 | Fill #0

## 2017-01-24 NOTE — Progress Notes (Signed)
Patient ID: Donald Bender, male   DOB: 1960-09-14, 56 y.o.   MRN: [id number]   Donald Bender, is a 56 y.o. male  [id number]:[id number]  [id number]  DOB - 28-Dec-1960  Subjective:  Chief Complaint and HPI: Donald Bender is a 56 y.o. male here today for BP and med check.  Has been out of meds for about 1 week.  No problems or complaints.  No CP, SOB, HA.    Still smoking, not ready to quit.  Smokes about 1ppd.     ROS:   Constitutional:  No f/c, No night sweats, No unexplained weight loss. EENT:  No vision changes, No blurry vision, No hearing changes. No mouth, throat, or ear problems.  Respiratory: No cough, No SOB Cardiac: No CP, no palpitations GI:  No abd pain, No N/V/D. GU: No Urinary s/sx Musculoskeletal: No joint pain Neuro: No headache, no dizziness, no motor weakness.  Skin: No rash Endocrine:  No polydipsia. No polyuria.  Psych: Denies SI/HI  No problems updated.  ALLERGIES: No Known Allergies  PAST MEDICAL HISTORY: Past Medical History:  Diagnosis Date  . Diabetes mellitus without complication ([place])   . Hypertension     MEDICATIONS AT HOME: Prior to Admission medications   Medication Sig Start Date End Date Taking? Authorizing Provider  amLODipine (NORVASC) 5 MG tablet Take 1 tablet (5 mg total) by mouth daily. 01/24/17   Argentina Donovan, PA-C  aspirin EC 81 MG tablet Take 1 tablet (81 mg total) by mouth daily. 01/24/17   Argentina Donovan, PA-C  atorvastatin (LIPITOR) 40 MG tablet Take 1 tablet (40 mg total) by mouth daily. For cholesterol 01/24/17   McClung, Dionne Bucy, PA-C  colchicine 0.6 MG tablet TAKE 2 TABLETS BY MOUTH AT THE ONSET OF A GOUT ATTACK, MAY REPEAT 1 TABLET IN 2 HOURS IF SYMPTOMS PERSIST. 01/24/17   Thereasa Solo, Dionne Bucy, PA-C  fenofibrate 160 MG tablet Take 1 tablet (160 mg total) by mouth daily. 01/24/17   Argentina Donovan, PA-C  lisinopril (PRINIVIL,ZESTRIL) 40 MG tablet Take 1 tablet (40 mg total) by mouth daily. 01/24/17   Argentina Donovan,  PA-C  metFORMIN (GLUCOPHAGE-XR) 500 MG 24 hr tablet Take 1 tablet (500 mg total) by mouth daily with breakfast. 01/24/17   Argentina Donovan, PA-C     Objective:  EXAM:   Vitals:   01/24/17 1449  BP: (!) 189/91  Pulse: 85  Resp: 18  Temp: 98.2 F (36.8 C)  TempSrc: Oral  SpO2: 94%  Weight: 271 lb (122.9 kg)  Height: 6\' 3"  (1.905 m)    General appearance : A&OX3. NAD. Non-toxic-appearing HEENT: Atraumatic and Normocephalic.  PERRLA. EOM intact.   Neck: supple, no JVD. No cervical lymphadenopathy. No thyromegaly Chest/Lungs:  Breathing-non-labored, Good air entry bilaterally, breath sounds normal without rales, rhonchi, or wheezing  CVS: S1 S2 regular, no murmurs, gallops, rubs  Extremities: Bilateral Lower Ext shows no edema, both legs are warm to touch with = pulse throughout Neurology:  CN II-XII grossly intact, Non focal.   Psych:  TP linear. J/I WNL. Normal speech. Appropriate eye contact and affect.  Skin:  No Rash  Data Review Lab Results  Component Value Date   HGBA1C 6.6 07/06/2016   HGBA1C 6.1 11/16/2015   HGBA1C 7.0 04/11/2015     Assessment & Plan   1. DM (diabetes mellitus) type 2, uncontrolled, with ketoacidosis (HCC) Adequate but suboptimal control-I have had a lengthy discussion and provided education about insulin resistance and the  intake of too much sugar/refined carbohydrates.  I have advised the patient to work at a goal of eliminating sugary drinks, candy, desserts, sweets, refined sugars, processed foods, and white carbohydrates.  The patient expresses understanding.  - HgB A1c - Glucose (CBG) For now, continue current dose and work on diet/exercise- metFORMIN (GLUCOPHAGE-XR) 500 MG 24 hr tablet; Take 1 tablet (500 mg total) by mouth daily with breakfast.  Dispense: 30 tablet; Refill: 5  2. Essential hypertension Uncontrolled but out of meds-resume meds and check BP 3X/week and record. We have discussed target BP range and blood pressure goal. I  have advised patient to check BP regularly and to call us back or report to clinic if the numbers are consistently higher than 140/90. We discussed the importance of compliance with medical therapy and DASH diet recommended, consequences of uncontrolled hypertension discussed.  - amLODipine (NORVASC) 5 MG tablet; Take 1 tablet (5 mg total) by mouth daily.  Dispense: 30 tablet; Refill: 5 - lisinopril (PRINIVIL,ZESTRIL) 40 MG tablet; Take 1 tablet (40 mg total) by mouth daily.  Dispense: 30 tablet; Refill: 5 - Comprehensive metabolic panel  3. Other hyperlipidemia Has been out of meds at least one week and not fasting - atorvastatin (LIPITOR) 40 MG tablet; Take 1 tablet (40 mg total) by mouth daily. For cholesterol  Dispense: 30 tablet; Refill: 5 - Lipid panel  4. Acute idiopathic gout of right foot - colchicine 0.6 MG tablet; TAKE 2 TABLETS BY MOUTH AT THE ONSET OF A GOUT ATTACK, MAY REPEAT 1 TABLET IN 2 HOURS IF SYMPTOMS PERSIST.  Dispense: 90 tablet; Refill: 3  5. Smoker-not ready to quit, but I did counsel him at length about the health risks and benefits.    Patient have been counseled extensively about nutrition and exercise  Return in about 3 months (around 04/24/2017) for Dr Margarita Rana; f/up DM, htn, lipids.  The patient was given clear instructions to go to ER or return to medical center if symptoms don't improve, worsen or new problems develop. The patient verbalized understanding. The patient was told to call to get lab results if they haven't heard anything in the next week.     Freeman Caldron, PA-C [facility] [place], [place]   01/24/2017, 3:00 PM

## 2017-01-24 NOTE — Patient Instructions (Signed)
Check blood pressure 3 times per week and record and bring to next visit.     Hyperglycemia Hyperglycemia is when the sugar (glucose) level in your blood is too high. It may not cause symptoms. If you do have symptoms, they may include warning signs, such as:  Feeling more thirsty than normal.  Hunger.  Feeling tired.  Needing to pee (urinate) more than normal.  Blurry eyesight (vision).  You may get other symptoms as it gets worse, such as:  Dry mouth.  Not being hungry (loss of appetite).  Fruity-smelling breath.  Weakness.  Weight gain or loss that is not planned. Weight loss may be fast.  A tingling or numb feeling in your hands or feet.  Headache.  Skin that does not bounce back quickly when it is lightly pinched and released (poor skin turgor).  Pain in your belly (abdomen).  Cuts or bruises that heal slowly.  High blood sugar can happen to people who do or do not have diabetes. High blood sugar can happen slowly or quickly, and it can be an emergency. Follow these instructions at home: General instructions  Take over-the-counter and prescription medicines only as told by your doctor.  Do not use products that contain nicotine or tobacco, such as cigarettes and e-cigarettes. If you need help quitting, ask your doctor.  Limit alcohol intake to no more than 1 drink per day for nonpregnant women and 2 drinks per day for men. One drink equals 12 oz of beer, 5 oz of wine, or 1 oz of hard liquor.  Manage stress. If you need help with this, ask your doctor.  Keep all follow-up visits as told by your doctor. This is important. Eating and drinking  Stay at a healthy weight.  Exercise regularly, as told by your doctor.  Drink enough fluid, especially when you: ? Exercise. ? Get sick. ? Are in hot temperatures.  Eat healthy foods, such as: ? Low-fat (lean) proteins. ? Complex carbs (complex carbohydrates), such as whole wheat bread or brown rice. ? Fresh  fruits and vegetables. ? Low-fat dairy products. ? Healthy fats.  Drink enough fluid to keep your pee (urine) clear or pale yellow. If you have diabetes:  Make sure you know the symptoms of hyperglycemia.  Follow your diabetes management plan, as told by your doctor. Make sure you: ? Take insulin and medicines as told. ? Follow your exercise plan. ? Follow your meal plan. Eat on time. Do not skip meals. ? Check your blood sugar as often as told. Make sure to check before and after exercise. If you exercise longer or in a different way than you normally do, check your blood sugar more often. ? Follow your sick day plan whenever you cannot eat or drink normally. Make this plan ahead of time with your doctor.  Share your diabetes management plan with people in your workplace, school, and household.  Check your urine for ketones when you are ill and as told by your doctor.  Carry a card or wear jewelry that says that you have diabetes. Contact a doctor if:  Your blood sugar level is higher than 240 mg/dL (13.3 mmol/L) for 2 days in a row.  You have problems keeping your blood sugar in your target range.  High blood sugar happens often for you. Get help right away if:  You have trouble breathing.  You have a change in how you think, feel, or act (mental status).  You feel sick to your stomach (  nauseous), and that feeling does not go away.  You cannot stop throwing up (vomiting). These symptoms may be an emergency. Do not wait to see if the symptoms will go away. Get medical help right away. Call your local emergency services (911 in the [place] Do not drive yourself to the hospital. Summary  Hyperglycemia is when the sugar (glucose) level in your blood is too high.  High blood sugar can happen to people who do or do not have diabetes.  Make sure you drink enough fluids, eat healthy foods, and exercise regularly.  Contact your doctor if you have problems keeping your blood  sugar in your target range. This information is not intended to replace advice given to you by your health care provider. Make sure you discuss any questions you have with your health care provider. Document Released: 11/12/2008 Document Revised: 10/03/2015 Document Reviewed: 10/03/2015 Elsevier Interactive Patient Education  2017 [employer].

## 2017-01-25 LAB — COMPREHENSIVE METABOLIC PANEL
A/G RATIO: 1.8 (ref 1.2–2.2)
ALT: 23 IU/L (ref 0–44)
AST: 18 IU/L (ref 0–40)
Albumin: 4.2 g/dL (ref 3.5–5.5)
Alkaline Phosphatase: 50 IU/L (ref 39–117)
BUN/Creatinine Ratio: 24 — ABNORMAL HIGH (ref 9–20)
BUN: 13 mg/dL (ref 6–24)
Bilirubin Total: 0.3 mg/dL (ref 0.0–1.2)
CALCIUM: 9.4 mg/dL (ref 8.7–10.2)
CO2: 27 mmol/L (ref 20–29)
Chloride: 102 mmol/L (ref 96–106)
Creatinine, Ser: 0.54 mg/dL — ABNORMAL LOW (ref 0.76–1.27)
GFR, EST AFRICAN AMERICAN: 136 mL/min/{1.73_m2} (ref >59)
GFR, EST NON AFRICAN AMERICAN: 117 mL/min/{1.73_m2} (ref >59)
GLUCOSE: 132 mg/dL — AB (ref 65–99)
Globulin, Total: 2.4 g/dL (ref 1.5–4.5)
POTASSIUM: 4.2 mmol/L (ref 3.5–5.2)
Sodium: 142 mmol/L (ref 134–144)
TOTAL PROTEIN: 6.6 g/dL (ref 6.0–8.5)

## 2017-01-25 LAB — LIPID PANEL
CHOL/HDL RATIO: 4.1 ratio (ref 0.0–5.0)
Cholesterol, Total: 215 mg/dL — ABNORMAL HIGH (ref 100–199)
HDL: 53 mg/dL (ref >39)
TRIGLYCERIDES: 555 mg/dL — AB (ref 0–149)

## 2017-01-28 ENCOUNTER — Telehealth: Payer: Self-pay | Admitting: *Deleted

## 2017-01-28 NOTE — Telephone Encounter (Signed)
-----   Message from Argentina Donovan, [place] sent at 01/25/2017 10:47 AM EST ----- Please call patient.  He definitely needs to be taking the fenofibrate I ordered for him and his cholesterol meds because his triglycerides are extremely high and his cholesterol is high.  His kidney function is a little impaired, so he really needs to work on the dietary changes we discussed to getter control of diabetes and keep his blood pressure well controlled. We will recheck this in 3 months. Thanks, Donald Caldron, PA-C

## 2017-01-28 NOTE — Telephone Encounter (Signed)
Medical Assistant left message on patient's home and cell voicemail. Voicemail states to give a call back to Marvelene Stoneberg with [facility] at [phone].  

## 2017-02-18 MED FILL — ?ATORVASTATIN 40MG TABLET: 40 | 30 days supply | Qty: 30 | Fill #1

## 2017-02-18 MED FILL — FENOFIBRATE 160 MG TABLET: 160 | 30 days supply | Qty: 30 | Fill #1

## 2017-02-18 MED FILL — LISINOPRIL 40 MG TAB: 40 | 30 days supply | Qty: 30 | Fill #0

## 2017-02-18 MED FILL — ?AMLODIPINE BESYLATE 5 MG T: 5 MG | 30 days supply | Qty: 30 | Fill #1

## 2017-02-18 MED FILL — METFORMIN HCL ER 500 MG TAB: 500 | 30 days supply | Qty: 30 | Fill #0

## 2017-03-25 MED FILL — ?ATORVASTATIN 40MG TAB: 40 | 30 days supply | Qty: 30 | Fill #2

## 2017-03-25 MED FILL — METFORMIN HCL ER 500 MG TAB: 500 | 30 days supply | Qty: 30 | Fill #1

## 2017-03-25 MED FILL — FENOFIBRATE 160 MG TABLET: 160 | 30 days supply | Qty: 30 | Fill #2

## 2017-03-25 MED FILL — LISINOPRIL 40 MG TAB: 40 | 30 days supply | Qty: 30 | Fill #1

## 2017-03-25 MED FILL — ?AMLODIPINE BESYLATE 5 MG T: 5 MG | 30 days supply | Qty: 30 | Fill #2

## 2020-03-18 ENCOUNTER — Observation Stay (HOSPITAL_COMMUNITY): Payer: Self-pay

## 2020-03-18 ENCOUNTER — Encounter (HOSPITAL_COMMUNITY): Payer: Self-pay | Admitting: Emergency Medicine

## 2020-03-18 ENCOUNTER — Emergency Department (HOSPITAL_COMMUNITY): Payer: Self-pay

## 2020-03-18 ENCOUNTER — Inpatient Hospital Stay (HOSPITAL_COMMUNITY)
Admission: EM | Admit: 2020-03-18 | Discharge: 2020-03-20 | DRG: 603 | Disposition: A | Discharge status: Home / Self Care | Payer: Self-pay | Attending: Family Medicine | Admitting: Family Medicine

## 2020-03-18 ENCOUNTER — Other Ambulatory Visit: Payer: Self-pay

## 2020-03-18 DIAGNOSIS — R809 Proteinuria, unspecified: Secondary | ICD-10-CM | POA: Diagnosis present

## 2020-03-18 DIAGNOSIS — Z79899 Other long term (current) drug therapy: Secondary | ICD-10-CM

## 2020-03-18 DIAGNOSIS — M109 Gout, unspecified: Secondary | ICD-10-CM | POA: Diagnosis present

## 2020-03-18 DIAGNOSIS — E1165 Type 2 diabetes mellitus with hyperglycemia: Secondary | ICD-10-CM | POA: Diagnosis present

## 2020-03-18 DIAGNOSIS — E7849 Other hyperlipidemia: Secondary | ICD-10-CM

## 2020-03-18 DIAGNOSIS — Z20822 Contact with and (suspected) exposure to covid-19: Secondary | ICD-10-CM | POA: Diagnosis present

## 2020-03-18 DIAGNOSIS — F17210 Nicotine dependence, cigarettes, uncomplicated: Secondary | ICD-10-CM | POA: Diagnosis present

## 2020-03-18 DIAGNOSIS — Z9119 Patient's noncompliance with other medical treatment and regimen: Secondary | ICD-10-CM

## 2020-03-18 DIAGNOSIS — L03113 Cellulitis of right upper limb: Principal | ICD-10-CM | POA: Diagnosis present

## 2020-03-18 DIAGNOSIS — R17 Unspecified jaundice: Secondary | ICD-10-CM | POA: Diagnosis present

## 2020-03-18 DIAGNOSIS — Z23 Encounter for immunization: Secondary | ICD-10-CM

## 2020-03-18 DIAGNOSIS — Z7984 Long term (current) use of oral hypoglycemic drugs: Secondary | ICD-10-CM

## 2020-03-18 DIAGNOSIS — R059 Cough, unspecified: Secondary | ICD-10-CM

## 2020-03-18 DIAGNOSIS — E1110 Type 2 diabetes mellitus with ketoacidosis without coma: Secondary | ICD-10-CM

## 2020-03-18 DIAGNOSIS — I10 Essential (primary) hypertension: Secondary | ICD-10-CM

## 2020-03-18 DIAGNOSIS — Z806 Family history of leukemia: Secondary | ICD-10-CM

## 2020-03-18 DIAGNOSIS — L711 Rhinophyma: Secondary | ICD-10-CM | POA: Diagnosis present

## 2020-03-18 DIAGNOSIS — D751 Secondary polycythemia: Secondary | ICD-10-CM

## 2020-03-18 DIAGNOSIS — E785 Hyperlipidemia, unspecified: Secondary | ICD-10-CM | POA: Diagnosis present

## 2020-03-18 DIAGNOSIS — L0390 Cellulitis, unspecified: Secondary | ICD-10-CM | POA: Diagnosis present

## 2020-03-18 DIAGNOSIS — E119 Type 2 diabetes mellitus without complications: Secondary | ICD-10-CM

## 2020-03-18 LAB — COMPREHENSIVE METABOLIC PANEL
ALT: 17 U/L (ref 0–44)
AST: 16 U/L (ref 15–41)
Albumin: 3 g/dL — ABNORMAL LOW (ref 3.5–5.0)
Alkaline Phosphatase: 59 U/L (ref 38–126)
Anion gap: 11 (ref 5–15)
BUN: 13 mg/dL (ref 6–20)
CO2: 26 mmol/L (ref 22–32)
Calcium: 8.8 mg/dL — ABNORMAL LOW (ref 8.9–10.3)
Chloride: 99 mmol/L (ref 98–111)
Creatinine, Ser: 0.78 mg/dL (ref 0.61–1.24)
GFR, Estimated: >60 mL/min (ref >60)
Glucose, Bld: 195 mg/dL — ABNORMAL HIGH (ref 70–99)
Potassium: 4.2 mmol/L (ref 3.5–5.1)
Sodium: 136 mmol/L (ref 135–145)
Total Bilirubin: 1.4 mg/dL — ABNORMAL HIGH (ref 0.3–1.2)
Total Protein: 7 g/dL (ref 6.5–8.1)

## 2020-03-18 LAB — HEMOGLOBIN A1C
Hgb A1c MFr Bld: 7.2 % — ABNORMAL HIGH (ref 4.8–5.6)
Mean Plasma Glucose: 159.94 mg/dL

## 2020-03-18 LAB — CBC WITH DIFFERENTIAL/PLATELET
Abs Immature Granulocytes: 0.02 10*3/uL (ref 0.00–0.07)
Basophils Absolute: 0.1 10*3/uL (ref 0.0–0.1)
Basophils Relative: 1 %
Eosinophils Absolute: 0.3 10*3/uL (ref 0.0–0.5)
Eosinophils Relative: 4 %
HCT: 64.2 % — ABNORMAL HIGH (ref 39.0–52.0)
Hemoglobin: 21.7 g/dL (ref 13.0–17.0)
Immature Granulocytes: 0 %
Lymphocytes Relative: 25 %
Lymphs Abs: 1.8 10*3/uL (ref 0.7–4.0)
MCH: 34.1 pg — ABNORMAL HIGH (ref 26.0–34.0)
MCHC: 33.8 g/dL (ref 30.0–36.0)
MCV: 100.9 fL — ABNORMAL HIGH (ref 80.0–100.0)
Monocytes Absolute: 0.5 10*3/uL (ref 0.1–1.0)
Monocytes Relative: 8 %
Neutro Abs: 4.4 10*3/uL (ref 1.7–7.7)
Neutrophils Relative %: 62 %
Platelets: UNDETERMINED 10*3/uL (ref 150–400)
RBC: 6.36 MIL/uL — ABNORMAL HIGH (ref 4.22–5.81)
RDW: 12.6 % (ref 11.5–15.5)
WBC: 7.1 10*3/uL (ref 4.0–10.5)
nRBC: 0 % (ref 0.0–0.2)

## 2020-03-18 LAB — LIPID PANEL
Cholesterol: 189 mg/dL (ref 0–200)
HDL: 47 mg/dL (ref >40)
LDL Cholesterol: 112 mg/dL — ABNORMAL HIGH (ref 0–99)
Total CHOL/HDL Ratio: 4 RATIO
Triglycerides: 152 mg/dL — ABNORMAL HIGH (ref <150)
VLDL: 30 mg/dL (ref 0–40)

## 2020-03-18 LAB — VITAMIN B12: Vitamin B-12: 340 pg/mL (ref 180–914)

## 2020-03-18 LAB — URIC ACID: Uric Acid, Serum: 7.6 mg/dL (ref 3.7–8.6)

## 2020-03-18 LAB — SEDIMENTATION RATE: Sed Rate: 1 mm/h (ref 0–16)

## 2020-03-18 LAB — ETHANOL: Alcohol, Ethyl (B): <10 mg/dL (ref <10)

## 2020-03-18 LAB — PHOSPHORUS: Phosphorus: 2.7 mg/dL (ref 2.5–4.6)

## 2020-03-18 LAB — MAGNESIUM: Magnesium: 1.4 mg/dL — ABNORMAL LOW (ref 1.7–2.4)

## 2020-03-18 LAB — RESP PANEL BY RT-PCR (FLU A&B, COVID) ARPGX2
Influenza A by PCR: NEGATIVE
Influenza B by PCR: NEGATIVE
SARS Coronavirus 2 by RT PCR: NEGATIVE

## 2020-03-18 LAB — HIV ANTIBODY (ROUTINE TESTING W REFLEX): HIV Screen 4th Generation wRfx: NONREACTIVE

## 2020-03-18 LAB — CBG MONITORING, ED
Glucose-Capillary: 238 mg/dL — ABNORMAL HIGH (ref 70–99)
Glucose-Capillary: 134 mg/dL — ABNORMAL HIGH (ref 70–99)

## 2020-03-18 LAB — LACTIC ACID, PLASMA: Lactic Acid, Venous: 1.8 mmol/L (ref 0.5–1.9)

## 2020-03-18 MED ORDER — FOLIC ACID 1 MG PO TABS
1.0000 mg | ORAL_TABLET | Freq: Every day | ORAL | Status: DC
  Administered 2020-03-18 – 2020-03-20 (×3): 1 mg via ORAL
  Filled 2020-03-18 (×3): qty 1

## 2020-03-18 MED ORDER — IBUPROFEN 200 MG PO TABS
400.0000 mg | ORAL_TABLET | Freq: Four times a day (QID) | ORAL | Status: DC | PRN
  Administered 2020-03-19 (×2): 400 mg via ORAL
  Filled 2020-03-18 (×2): qty 2

## 2020-03-18 MED ORDER — TETANUS-DIPHTH-ACELL PERTUSSIS 5-2.5-18.5 LF-MCG/0.5 IM SUSY
0.5000 mL | PREFILLED_SYRINGE | Freq: Once | INTRAMUSCULAR | Status: AC
  Administered 2020-03-18: 0.5 mL via INTRAMUSCULAR
  Filled 2020-03-18: qty 0.5

## 2020-03-18 MED ORDER — LOSARTAN POTASSIUM 50 MG PO TABS
25.0000 mg | ORAL_TABLET | Freq: Every day | ORAL | Status: DC
  Administered 2020-03-18 – 2020-03-20 (×3): 25 mg via ORAL
  Filled 2020-03-18 (×3): qty 1

## 2020-03-18 MED ORDER — ACETAMINOPHEN 325 MG PO TABS: 650.0000 mg | ORAL_TABLET | Freq: Once | ORAL | Status: DC

## 2020-03-18 MED ORDER — LOSARTAN POTASSIUM 50 MG PO TABS: 25.0000 mg | ORAL_TABLET | Freq: Every day | ORAL | Status: DC

## 2020-03-18 MED ORDER — VANCOMYCIN HCL 10 G IV SOLR
2250.0000 mg | Freq: Once | INTRAVENOUS | Status: AC
  Administered 2020-03-18: 2250 mg via INTRAVENOUS
  Filled 2020-03-18: qty 1750

## 2020-03-18 MED ORDER — HEPARIN SODIUM (PORCINE) 5000 UNIT/ML IJ SOLN
5000.0000 [IU] | Freq: Three times a day (TID) | INTRAMUSCULAR | Status: DC
  Administered 2020-03-18 – 2020-03-20 (×6): 5000 [IU] via SUBCUTANEOUS
  Filled 2020-03-18 (×6): qty 1

## 2020-03-18 MED ORDER — INSULIN ASPART 100 UNIT/ML ~~LOC~~ SOLN: 0.0000 [IU] | Freq: Three times a day (TID) | SUBCUTANEOUS | Status: DC

## 2020-03-18 MED ORDER — THIAMINE HCL 100 MG PO TABS
100.0000 mg | ORAL_TABLET | Freq: Every day | ORAL | Status: DC
  Administered 2020-03-18 – 2020-03-20 (×3): 100 mg via ORAL
  Filled 2020-03-18 (×3): qty 1

## 2020-03-18 MED ORDER — METFORMIN HCL ER 500 MG PO TB24: 500.0000 mg | ORAL_TABLET | Freq: Every day | ORAL | Status: DC

## 2020-03-18 MED ORDER — METFORMIN HCL ER 500 MG PO TB24
500.0000 mg | ORAL_TABLET | Freq: Every day | ORAL | Status: DC
  Administered 2020-03-18 – 2020-03-20 (×3): 500 mg via ORAL
  Filled 2020-03-18 (×4): qty 1

## 2020-03-18 MED ORDER — VANCOMYCIN HCL 1500 MG/300ML IV SOLN
1500.0000 mg | Freq: Two times a day (BID) | INTRAVENOUS | Status: DC
  Administered 2020-03-19 – 2020-03-20 (×3): 1500 mg via INTRAVENOUS
  Filled 2020-03-18 (×4): qty 300

## 2020-03-18 MED ORDER — METFORMIN HCL ER 500 MG PO TB24
500.0000 mg | ORAL_TABLET | Freq: Every day | ORAL | Status: DC
  Filled 2020-03-18: qty 1

## 2020-03-18 MED ORDER — THIAMINE HCL 100 MG/ML IJ SOLN
100.0000 mg | Freq: Every day | INTRAMUSCULAR | Status: DC
  Filled 2020-03-18 (×2): qty 2

## 2020-03-18 MED ORDER — ACETAMINOPHEN 325 MG PO TABS
650.0000 mg | ORAL_TABLET | Freq: Once | ORAL | Status: AC | PRN
  Administered 2020-03-18: 650 mg via ORAL
  Filled 2020-03-18: qty 2

## 2020-03-18 MED ORDER — SODIUM CHLORIDE 0.9 % IV SOLN: INTRAVENOUS | Status: DC

## 2020-03-18 MED ORDER — AMLODIPINE BESYLATE 5 MG PO TABS
5.0000 mg | ORAL_TABLET | Freq: Every day | ORAL | Status: DC
  Administered 2020-03-18: 5 mg via ORAL
  Filled 2020-03-18: qty 1

## 2020-03-18 MED ORDER — SODIUM CHLORIDE 0.9 % IV SOLN
2.0000 g | INTRAVENOUS | Status: DC
  Administered 2020-03-18 – 2020-03-19 (×2): 2 g via INTRAVENOUS
  Filled 2020-03-18 (×2): qty 20

## 2020-03-18 MED ORDER — ADULT MULTIVITAMIN W/MINERALS CH
1.0000 | ORAL_TABLET | Freq: Every day | ORAL | Status: DC
  Administered 2020-03-18 – 2020-03-20 (×3): 1 via ORAL
  Filled 2020-03-18 (×3): qty 1

## 2020-03-18 MED ORDER — NICOTINE 21 MG/24HR TD PT24
21.0000 mg | MEDICATED_PATCH | Freq: Every day | TRANSDERMAL | Status: DC
  Administered 2020-03-18 – 2020-03-20 (×3): 21 mg via TRANSDERMAL
  Filled 2020-03-18 (×3): qty 1

## 2020-03-18 MED ORDER — MAGNESIUM SULFATE 2 GM/50ML IV SOLN
2.0000 g | Freq: Once | INTRAVENOUS | Status: AC
  Administered 2020-03-18: 2 g via INTRAVENOUS
  Filled 2020-03-18: qty 50

## 2020-03-18 MED ORDER — MUPIROCIN 2 % EX OINT
TOPICAL_OINTMENT | CUTANEOUS | Status: AC
  Filled 2020-03-18: qty 22

## 2020-03-18 MED ORDER — CEFAZOLIN SODIUM-DEXTROSE 2-4 GM/100ML-% IV SOLN
2.0000 g | Freq: Once | INTRAVENOUS | Status: AC
  Administered 2020-03-18: 2 g via INTRAVENOUS
  Filled 2020-03-18: qty 100

## 2020-03-18 NOTE — Progress Notes (Signed)
Paged family medicine for PRN blood pressure medication. Will continue to monitor.

## 2020-03-18 NOTE — Medical Student Note (Signed)
[name] DEPT Provider Student Note For educational purposes for Medical, PA and NP students only and not part of the legal medical record.   CSN: [id number] Arrival date & time: 03/18/20  3810  History   Chief Complaint Chief Complaint  Patient presents with  . Cellulitis   HPI Donald Bender is a 60 y.o. male with PMH of hypertension, T2DM, alcohol use   States he has had redness and swelling of right hand and wrist for three days. Denies trauma to the hand. Was working on his deck and caught a splinter in his right middle finger, but no other events. Denies any animals at home or possible bites. Denies recent travel or camping history. Denies fevers, headache, N/V/D, myalgias or new joint pains. Denies IVDU.   States he has not been to PCP in years. Does not take prescribed medications and does not check BP or BG at home. BP 214/103 on arrival. States he drinks a pint alcohol/day.  Past Medical History:  Diagnosis Date  . Diabetes mellitus without complication ([place])   . Hypertension     Patient Active Problem List   Diagnosis Date Noted  . Hypertriglyceridemia 07/06/2016  . Gout 11/16/2015  . HTN (hypertension) 09/11/2013  . DM (diabetes mellitus) type 2, uncontrolled, with ketoacidosis ([place]) 09/11/2013    History reviewed. No pertinent surgical history.   Home Medications    Prior to Admission medications   Medication Sig Start Date End Date Taking? Authorizing Provider  amLODipine (NORVASC) 5 MG tablet Take 1 tablet (5 mg total) by mouth daily. 01/24/17   Argentina Donovan, PA-C  aspirin EC 81 MG tablet Take 1 tablet (81 mg total) by mouth daily. 01/24/17   Argentina Donovan, PA-C  atorvastatin (LIPITOR) 40 MG tablet Take 1 tablet (40 mg total) by mouth daily. For cholesterol 01/24/17   McClung, Dionne Bucy, PA-C  colchicine 0.6 MG tablet TAKE 2 TABLETS BY MOUTH AT THE ONSET OF A GOUT ATTACK, MAY REPEAT 1 TABLET IN 2 HOURS IF SYMPTOMS PERSIST. 01/24/17   Thereasa Solo,  Dionne Bucy, PA-C  fenofibrate 160 MG tablet Take 1 tablet (160 mg total) by mouth daily. 01/24/17   Argentina Donovan, PA-C  lisinopril (PRINIVIL,ZESTRIL) 40 MG tablet Take 1 tablet (40 mg total) by mouth daily. 01/24/17   Argentina Donovan, PA-C  metFORMIN (GLUCOPHAGE-XR) 500 MG 24 hr tablet Take 1 tablet (500 mg total) by mouth daily with breakfast. 01/24/17   Argentina Donovan, PA-C    Family History Family History  Problem Relation Age of Onset  . Leukemia Mother 71    Social History Social History   Tobacco Use  . Smoking status: Current Every Day Smoker    Packs/day: 1.00  . Smokeless tobacco: Never Used  . Tobacco comment: Smoking 1 ppd  Substance Use Topics  . Alcohol use: Yes    Alcohol/week: 6.0 standard drinks    Types: 6 Cans of beer per week    Comment: 2-3 times weekly  . Drug use: No     Allergies   Patient has no known allergies.   Review of Systems Review of Systems  Constitutional: Negative for fatigue and fever.  HENT: Negative.   Eyes: Negative.   Respiratory: Negative.   Cardiovascular: Negative.   Gastrointestinal: Negative.   Endocrine: Negative.   Genitourinary: Negative.   Musculoskeletal: Positive for joint swelling.  Skin: Positive for color change and wound.  Allergic/Immunologic: Negative.   Neurological: Negative.   Hematological: Negative.  Psychiatric/Behavioral: Negative.    Physical Exam Updated Vital Signs BP (!) 214/103 (BP Location: Right Arm)   Pulse 93   Temp 98 F (36.7 C)   Resp 19   SpO2 94%   Physical Exam Vitals and nursing note reviewed.  Constitutional:      Appearance: Normal appearance. He is not toxic-appearing.  HENT:     Head: Normocephalic and atraumatic.     Mouth/Throat:     Mouth: Mucous membranes are moist.     Pharynx: Oropharynx is clear.  Eyes:     Extraocular Movements: Extraocular movements intact.     Pupils: Pupils are equal, round, and reactive to light.  Cardiovascular:     Rate and  Rhythm: Normal rate and regular rhythm.     Pulses: Normal pulses.  Pulmonary:     Effort: Pulmonary effort is normal.     Breath sounds: Normal breath sounds.  Abdominal:     General: Abdomen is protuberant. Bowel sounds are normal.     Tenderness: There is no abdominal tenderness.  Musculoskeletal:        General: Swelling and tenderness present.     Right wrist: Swelling and tenderness present. No effusion. Decreased range of motion.       Arms:     Cervical back: Normal range of motion and neck supple.  Skin:    Findings: Erythema present.  Neurological:     General: No focal deficit present.     Mental Status: He is alert.  Psychiatric:        Mood and Affect: Mood normal.    ED Treatments / Results  Labs (all labs ordered are listed, but only abnormal results are displayed) Labs Reviewed  COMPREHENSIVE METABOLIC PANEL - Abnormal; Notable for the following components:      Result Value   Glucose, Bld 195 (*)    Calcium 8.8 (*)    Albumin 3.0 (*)    Total Bilirubin 1.4 (*)    All other components within normal limits  CBC WITH DIFFERENTIAL/PLATELET - Abnormal; Notable for the following components:   RBC 6.36 (*)    Hemoglobin 21.7 (*)    HCT 64.2 (*)    MCV 100.9 (*)    MCH 34.1 (*)    All other components within normal limits  CBG MONITORING, ED - Abnormal; Notable for the following components:   Glucose-Capillary 238 (*)    All other components within normal limits  LACTIC ACID, PLASMA  LACTIC ACID, PLASMA   EKG  Procedures Procedures (including critical care time)  Medications Ordered in ED Medications  ceFAZolin (ANCEF) IVPB 2g/100 mL premix (has no administration in time range)   Initial Impression / Assessment and Plan / ED Course  I have reviewed the triage vital signs and the nursing notes.  Pertinent labs & imaging results that were available during my care of the patient were reviewed by me and considered in my medical decision making (see  chart for details).  Donald Bender is a 18yoM with PMH of DM, HTN, alcohol use who presents for swelling of the right hand. HPI above.  Do not think this is DVT or acute arterial embolization. Proximal extremity is not swollen. The arm is not dusky and there is a 2+ pulse.   Cellulitis - There does not appear to be fluctuance suggesting abscess or collection to drain. - He does not have systemic symptoms of sepsis. Lactic 1.8, WBC 8.7, not febrile, tachycardic/tachypneic.  - imaging pending  -  IV Cefazolin   Given uncontrolled T2DM, HTN and alcohol use combined with need for IV abx will admit  Final Clinical Impressions(s) / ED Diagnoses   Final diagnoses:  Cellulitis of right upper extremity  Hypertension, unspecified type  Polycythemia    New Prescriptions New Prescriptions   No medications on file

## 2020-03-18 NOTE — Progress Notes (Signed)
FPTS Interim Progress Note  S: Nurse paged reporting 8/10 hand pain that was not responsive to Tylenol 650 mg.  Visited patient, found him laying comfortably in bed watching television.  His right hand was wrapped from knuckles to mid forearm and elevated 90 degrees upright using foam orthopedic support.  Patient reported dull achy hand pain that was limited to his right wrist.  No radiation, no sharp/burning/electric/radiating pain.  Pain at rest.  Patient attributes this likely to extreme elevation.  O: BP (!) 220/110 (BP Location: Left Arm)   Pulse 90   Temp 98.4 F (36.9 C) (Oral)   Resp 18   SpO2 91%    General: Awake, alert, oriented, no acute distress Respiratory: Unlabored respirations, speaking in full sentences, no respiratory distress Extremities: Moving all extremities spontaneously, right hand wrapped from knuckles to mid forearm, fingers and forearm warm to touch, no rash or lesions appreciated on arm Neuro: Cranial nerves II through X grossly intact Psych: Normal insight and judgement   A/P: Presumptive cellulitis of the distal right upper extremity -Tylenol 650 mg every 6 as needed -Ibuprofen 400 mg every 6 as needed -Nurse may consider alternating ibuprofen and Tylenol or simply using ibuprofen only -Continue wrap and elevation per Rickard Patience, MD 03/18/2020, 11:50 PM PGY-1, [place] Medicine Service pager [phone]

## 2020-03-18 NOTE — ED Notes (Signed)
Patient transported to X-ray 

## 2020-03-18 NOTE — Consult Note (Signed)
Patient was seen and examined at bedside.  The patient has noticed a 3-day history of swelling in his hand.  He states he may have had a splinter in.  With this.  His x-rays are negative.  He has soft compartments throughout the hand and does not have pain on passive motion of the fingers or wrist to suggest a septic joint.  I examined him at length.  I reviewed the consult note from our physician assistant Orion Crook.  At present juncture I would treat him presumptively as a cellulitis.  I have placed a dressing on him and would agree with the antibiotic approach as well as maximum elevation which I discussed with the nursing staff.  We will continue to keep a close eye on the patient for evolution of the process and certainly if he worsens consider changing directions.  For now we will treat this presumptively as a cellulitis.  I reviewed his chart in detail.  Avyn Aden MD

## 2020-03-18 NOTE — ED Notes (Signed)
Industrial/product designer

## 2020-03-18 NOTE — ED Provider Notes (Signed)
[facility] EMERGENCY DEPARTMENT Provider Note   CSN: [id number] Arrival date & time: 03/18/20  7741     History Chief Complaint  Patient presents with  . Cellulitis    Donald Bender is a 60 y.o. male.  HPI   HPI Donald Bender is a 60 y.o. male with PMH of hypertension, T2DM, alcohol use   States he has had redness and swelling of right hand and wrist for three days. Denies trauma to the hand. Was working on his deck and caught a splinter in his right middle finger, but no other events. Denies any animals at home or possible bites. Denies recent travel or camping history. Denies fevers, headache, N/V/D, myalgias or new joint pains. Denies IVDU.   States he has not been to PCP in years. Does not take prescribed medications and does not check BP or BG at home. BP 214/103 on arrival. States he drinks a pint alcohol/day.     Past Medical History:  Diagnosis Date  . Diabetes mellitus without complication ([place])   . Hypertension     Patient Active Problem List   Diagnosis Date Noted  . Hypertriglyceridemia 07/06/2016  . Gout 11/16/2015  . HTN (hypertension) 09/11/2013  . DM (diabetes mellitus) type 2, uncontrolled, with ketoacidosis ([place]) 09/11/2013    History reviewed. No pertinent surgical history.     Family History  Problem Relation Age of Onset  . Leukemia Mother 60    Social History   Tobacco Use  . Smoking status: Current Every Day Smoker    Packs/day: 1.00  . Smokeless tobacco: Never Used  . Tobacco comment: Smoking 1 ppd  Substance Use Topics  . Alcohol use: Yes    Alcohol/week: 6.0 standard drinks    Types: 6 Cans of beer per week    Comment: 2-3 times weekly  . Drug use: No    Home Medications Prior to Admission medications   Medication Sig Start Date End Date Taking? Authorizing Provider  amLODipine (NORVASC) 5 MG tablet Take 1 tablet (5 mg total) by mouth daily. 01/24/17   Argentina Donovan, PA-C  aspirin EC 81 MG  tablet Take 1 tablet (81 mg total) by mouth daily. 01/24/17   Argentina Donovan, PA-C  atorvastatin (LIPITOR) 40 MG tablet Take 1 tablet (40 mg total) by mouth daily. For cholesterol 01/24/17   McClung, Dionne Bucy, PA-C  colchicine 0.6 MG tablet TAKE 2 TABLETS BY MOUTH AT THE ONSET OF A GOUT ATTACK, MAY REPEAT 1 TABLET IN 2 HOURS IF SYMPTOMS PERSIST. 01/24/17   Thereasa Solo, Dionne Bucy, PA-C  fenofibrate 160 MG tablet Take 1 tablet (160 mg total) by mouth daily. 01/24/17   Argentina Donovan, PA-C  lisinopril (PRINIVIL,ZESTRIL) 40 MG tablet Take 1 tablet (40 mg total) by mouth daily. 01/24/17   Argentina Donovan, PA-C  metFORMIN (GLUCOPHAGE-XR) 500 MG 24 hr tablet Take 1 tablet (500 mg total) by mouth daily with breakfast. 01/24/17   Argentina Donovan, PA-C    Allergies    Patient has no known allergies.  Review of Systems   Review of Systems Review of Systems Review of Systems  Constitutional: Negative for fatigue and fever.  HENT: Negative.   Eyes: Negative.   Respiratory: Negative.   Cardiovascular: Negative.   Gastrointestinal: Negative.   Endocrine: Negative.   Genitourinary: Negative.   Musculoskeletal: Positive for joint swelling.  Skin: Positive for color change and wound.  Allergic/Immunologic: Negative.   Neurological: Negative.   Hematological:  Negative.   Psychiatric/Behavioral: Negative.   Physical Exam Updated Vital Signs BP (!) 194/101   Pulse 83   Temp 98 F (36.7 C)   Resp 14   SpO2 93%   Physical Exam Skin:    Comments: Hemangiomas neck and upper chest    Physical Exam Updated Vital Signs BP (!) 214/103 (BP Location: Right Arm)   Pulse 93   Temp 98 F (36.7 C)   Resp 19   SpO2 94%   Physical Exam Vitals and nursing note reviewed.  Constitutional:      Appearance: Normal appearance. He is not toxic-appearing.  HENT:     Head: Normocephalic and atraumatic.     Mouth/Throat:     Mouth: Mucous membranes are moist.     Pharynx: Oropharynx is clear.   Eyes:     Extraocular Movements: Extraocular movements intact.     Pupils: Pupils are equal, round, and reactive to light.  Cardiovascular:     Rate and Rhythm: Normal rate and regular rhythm.     Pulses: Normal pulses.  Pulmonary:     Effort: Pulmonary effort is normal.     Breath sounds: Normal breath sounds.  Abdominal:     General: Abdomen is protuberant. Bowel sounds are normal.     Tenderness: There is no abdominal tenderness.  Musculoskeletal:        General: Swelling and tenderness present.     Right wrist: Swelling and tenderness present. No effusion. Decreased range of motion.       Arms:     Cervical back: Normal range of motion and neck supple.  Skin:    Findings: Erythema present.  Neurological:     General: No focal deficit present.     Mental Status: He is alert.  Psychiatric:        Mood and Affect: Mood normal.   ED Results / Procedures / Treatments   Labs (all labs ordered are listed, but only abnormal results are displayed) Labs Reviewed  COMPREHENSIVE METABOLIC PANEL - Abnormal; Notable for the following components:      Result Value   Glucose, Bld 195 (*)    Calcium 8.8 (*)    Albumin 3.0 (*)    Total Bilirubin 1.4 (*)    All other components within normal limits  CBC WITH DIFFERENTIAL/PLATELET - Abnormal; Notable for the following components:   RBC 6.36 (*)    Hemoglobin 21.7 (*)    HCT 64.2 (*)    MCV 100.9 (*)    MCH 34.1 (*)    All other components within normal limits  CBG MONITORING, ED - Abnormal; Notable for the following components:   Glucose-Capillary 238 (*)    All other components within normal limits  LACTIC ACID, PLASMA  LACTIC ACID, PLASMA    EKG None  Radiology DG Hand 2 View Right  Result Date: 03/18/2020 CLINICAL DATA:  Infection. Right hand pain, swelling, and redness for 3 days. No reported injury. EXAM: RIGHT HAND - 2 VIEW COMPARISON:  None. FINDINGS: No acute fracture or dislocation is identified. Mild degenerative  spurring is noted at the thumb IP joint and PIP and DIP joints of multiple fingers. There is prominent spurring at the third MCP joint with milder spurring at the fourth MCP joint. There is prominent soft tissue swelling along the dorsal and ulnar aspects of the hand extending into the wrist. No osseous erosion, subcutaneous emphysema, radiopaque foreign body is identified. IMPRESSION: Soft tissue swelling without acute osseous abnormality identified.  Electronically Signed   By: Logan Bores M.D.   On: 03/18/2020 12:33    Procedures Procedures   Medications Ordered in ED Medications  ceFAZolin (ANCEF) IVPB 2g/100 mL premix (2 g Intravenous New Bag/Given 03/18/20 1307)    ED Course  I have reviewed the triage vital signs and the nursing notes.  Pertinent labs & imaging results that were available during my care of the patient were reviewed by me and considered in my medical decision making (see chart for details).    MDM Rules/Calculators/A&P                          60 year old man history of diabetes, hypertension, alcohol use, noncompliance of medications presents today with right hand redness swelling and pain.  On physical exam, he has diffuse swelling of his hand foreign body in the palmar surface of the third digit.  Patient has redness swelling proximally up to the upper arm.  Patient received IV antibiotics here.  Plan and consult and medicine consult for further evaluation and treatment. 1- hand infection with fb of finger- iv cefazoline,nos systemic symptoms  2- hypertension- noncompliant with meds bp 194/101 3- t2dm blood sugar 195 no evidence of dka 4 daily alcohol use- no ho withdrawal per patient.  Hypertensive here but not tachy or tremors 5- polycythemia Discussed with hand surgery days will see and evaluate Discussed with family practice teaching service as patient is unassigned.  They will see and evaluate patient Final Clinical Impression(s) / ED Diagnoses Final  diagnoses:  Cellulitis of right upper extremity  Hypertension, unspecified type  Polycythemia    Rx / DC Orders ED Discharge Orders    None       Pattricia Boss, MD 03/18/20 1454

## 2020-03-18 NOTE — H&P (Addendum)
[facility] Admission History and Physical Service Pager: [phone]  Patient name: Donald Bender Medical record number: [id number] Date of birth: 1960-08-16 Age: 60 y.o. Gender: male  Primary Care Provider: Charlott Rakes, MD Consultants: Orthopedic surgery Code Status: Full code Preferred Emergency Contact: Donald Bender (sig other) [phone]  Chief Complaint: right hand pain  Assessment and Plan: Donald Bender is a 60 y.o. male presenting with new onset right hand pain. PMH is significant for hypertension, type 2 DM, rosacea, alcohol use and tobacco use.  Localized erythema of dominant right hand: Stable Presented with 3 day onset of right hand pain and swelling that has spread to his mid-forearm. Noted to have worsening localized erythema on exam with significant associated edema. On presentation, patient noted to be afebrile, lactic acid wnl and without evidence of leukocytosis. XR right hand noted to have soft tissue swelling without acute osseous abnormality identified. S/p IV cefazolin 2g in the ED. Per patient report, no trauma other than a splinter noted in his 3rd finger. Hand Surgery evaluated in ED and recommended MRI. Low concern for upper extremity DVT given Wells score of 0 and lack of clinical findings. Also on the differential is gout given history, although less common localized upper extremity finding. Concern for septic arthritis since symptoms seem to have started with joint changes, patient, denies any penile discharge or rash along groin, consider gonorrhea and chlamydia. Likely soft tissue cellulitis without concern for systemic infection. -Admit to FPTS, attending Dr. Owens Bender -s/p IV cefazolin 2g x1 (02/18) -IV vancomycin and ceftriaxone (02/18-) -orthopedic surgery recs appreciated  -MRI right hand pending -pending uric acid -pending UA -am labs -blood cultures x2 -consider STI testing for GC arthritis if symptoms worsen -tetanus  vaccine -PT/OT eval -incentive spirometer  Elevated Hemoglobin: Stable On admission, Hgb 21.7. Per chart review, this could possibly be around patient's baseline. 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 Also considered dehydration from ETOH as possible etiology -monitor CBC -IV NS 150/hr  Hypertension On admission, hypertensive BP 199/102. Reports not taking any home medications, was previously prescribed lisinopril and amlodipine but does not take it. It has been about more than a year since patient last saw his PCP, was previously seen at the [facility] Given amlodipine 5 mg in ED.   -Start Losartan 25 mg daily -monitor BP  Type 2 DM: Stable Most recent CBG 238. A1c 6.1 on 01/24/2017. Home medications include metformin XR 500 mg but he has not taken this in over a year. -consider repeat A1c -Will restart metformin  Rosacea  Chronic and stable. Present on exam, reports chronic history of this for years now. 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   Alcohol use  Reports drinking 3 shots of bourbon nightly for which he uses it as a sleep aide. Last drink reported was last night at 10pm. Denies every having been hospitalized for alcohol withdrawals. Denies experiencing hallucinations and tremors.  -monitor CIWAs with protocol -monitor for signs of withdrawal  -seizure precautions -Thiamine, Multivitamins and Folic Acid daily  Tobacco use  Current smoker, smokes 2 packs/day -nicotine patch 21 mg ordered -encourage smoking cessation  Gout Home medications include colchicine 0.6 mg daily, only uses in the acute setting. Reports last flair-up was over a year ago, has only ever had 2-3 flair-ups thus far. -hold colchicine  -pending uric acid   Hyperlipidemia  Most recent lipid panel on 01/24/2017 notable for elevated cholesterol of  215 and triglycerides 555. Previously prescribed atorvastatin 40 mg but reports  non-compliance. -pending repeat lipid panel -consider reinitiating statin therapy   FEN/GI: heart healthy/carb modified diet Prophylaxis: heparin subq  Disposition: admit to FPTS, Med Surg, attending Dr. Owens Bender  History of Present Illness:  Donald Bender is a 60 y.o. male presenting with right hand pain. Stated that the pain started in his wrist and traveled down his hand and up his arm. Pain started 3 days ago, rates it at 10/10 when it started and now is 7/10. Has not used any topicals but has been taking ibuprofen and tylenol which he states temporarily resolves the pain. No trauma noted except that he noticed a splinter when he was working out on the deck, works in [employer]. Reports that the swelling and erythema has worsened. Denies recent sick contacts or illness. Denies fever, chills, night sweats, dyspnea, chest pain, abdominal pain, nausea and vomiting. Able to complete all ADLs, walks without assistance of cane, walker or wheelchair. Has a history of gout and thought it was initially this so he tried colchicine but this made it worse. Denies changes to diet or new allergies. Reports that this similar instance occurred with his foot about 10 years ago when he stepped on a nail, that was the last time he received his tetanus shot. Denies any history of falls. Lives with girlfriend. Denies penile discharge or rash along groin. Last saw PCP more than a year ago, [facility].     Review Of Systems: Per HPI with the following additions:   Review of Systems  Constitutional: Negative for appetite change, chills, fever and unexpected weight change.  HENT: Negative for rhinorrhea.   Eyes: Negative for visual disturbance.  Respiratory: Positive for cough. Negative for chest tightness and shortness of breath.   Cardiovascular: Negative for chest pain and leg swelling.  Gastrointestinal: Negative for abdominal pain, blood in stool, nausea and vomiting.   Genitourinary: Negative for decreased urine volume, difficulty urinating, genital sores, hematuria, penile discharge, penile pain, penile swelling and scrotal swelling.  Musculoskeletal: Positive for arthralgias and joint swelling.  Skin: Negative for rash.  Neurological: Positive for numbness. Negative for dizziness, seizures and headaches.  Hematological: Does not bruise/bleed easily.  Psychiatric/Behavioral: Negative for confusion, decreased concentration, hallucinations and suicidal ideas. The patient is not nervous/anxious.      Patient Active Problem List   Diagnosis Date Noted  . Hypertriglyceridemia 07/06/2016  . Gout 11/16/2015  . HTN (hypertension) 09/11/2013  . DM (diabetes mellitus) type 2, uncontrolled, with ketoacidosis ([place]) 09/11/2013    Past Medical History: Past Medical History:  Diagnosis Date  . Diabetes mellitus without complication ([place])   . Hypertension     Past Surgical History: History reviewed. No pertinent surgical history.  Social History: Social History   Tobacco Use  . Smoking status: Current Every Day Smoker    Packs/day: 1.00  . Smokeless tobacco: Never Used  . Tobacco comment: Smoking 1 ppd  Substance Use Topics  . Alcohol use: Yes    Alcohol/week: 6.0 standard drinks    Types: 6 Cans of beer per week    Comment: 2-3 times weekly  . Drug use: No    Please also refer to relevant sections of EMR.  Family History: Family History  Problem Relation Age of Onset  . Leukemia Mother 16    Allergies and Medications: No Known Allergies No current facility-administered medications on file prior to encounter.   Current Outpatient Medications  on File Prior to Encounter  Medication Sig Dispense Refill  . amLODipine (NORVASC) 5 MG tablet Take 1 tablet (5 mg total) by mouth daily. 30 tablet 5  . aspirin EC 81 MG tablet Take 1 tablet (81 mg total) by mouth daily. 30 tablet 5  . atorvastatin (LIPITOR) 40 MG tablet Take 1 tablet (40 mg total)  by mouth daily. For cholesterol 30 tablet 5  . colchicine 0.6 MG tablet TAKE 2 TABLETS BY MOUTH AT THE ONSET OF A GOUT ATTACK, MAY REPEAT 1 TABLET IN 2 HOURS IF SYMPTOMS PERSIST. 90 tablet 3  . fenofibrate 160 MG tablet Take 1 tablet (160 mg total) by mouth daily. 30 tablet 5  . lisinopril (PRINIVIL,ZESTRIL) 40 MG tablet Take 1 tablet (40 mg total) by mouth daily. 30 tablet 5  . metFORMIN (GLUCOPHAGE-XR) 500 MG 24 hr tablet Take 1 tablet (500 mg total) by mouth daily with breakfast. 30 tablet 5    Objective: BP (!) 199/102   Pulse 85   Temp 98 F (36.7 C)   Resp 13   SpO2 97%  Exam: General: Patient sitting upright, in no acute distress. Eyes: no scleral icterus Neck: supple, non-tender thyroid, no evidence of lymphadenopathy  Cardiovascular: RRR, no murmurs or gallops auscultated  Respiratory: CTAB with some upper respiratory airway sounds noted, normal WOB Gastrointestinal: soft, non-tender, presence of active bowel sounds MSK: no LE edema noted bilaterally, radial and distal pulses strong and equal bilaterally  Derm: skin warm and dry to touch, pink tint to chest and UE consistent with rosacea, right hand to mid-forearm noted to be erythematous and edema noted only on the dorsal aspect, warm to touch, no pus or drainage noted Neuro: AOx4, gross sensation intact  Psych: mood appropriate, denies both auditory and visual hallucinations   Labs and Imaging: CBC BMET  Recent Labs  Lab 03/18/20 1009  WBC 7.1  HGB 21.7*  HCT 64.2*  PLT PLATELET CLUMPS NOTED ON SMEAR, UNABLE TO ESTIMATE   Recent Labs  Lab 03/18/20 1009  NA 136  K 4.2  CL 99  CO2 26  BUN 13  CREATININE 0.78  GLUCOSE 195*  CALCIUM 8.8*     EKG: not acquired   DG Hand 2 View Right  Result Date: 03/18/2020 CLINICAL DATA:  Infection. Right hand pain, swelling, and redness for 3 days. No reported injury. EXAM: RIGHT HAND - 2 VIEW COMPARISON:  None. FINDINGS: No acute fracture or dislocation is identified. Mild  degenerative spurring is noted at the thumb IP joint and PIP and DIP joints of multiple fingers. There is prominent spurring at the third MCP joint with milder spurring at the fourth MCP joint. There is prominent soft tissue swelling along the dorsal and ulnar aspects of the hand extending into the wrist. No osseous erosion, subcutaneous emphysema, radiopaque foreign body is identified. IMPRESSION: Soft tissue swelling without acute osseous abnormality identified. Electronically Signed   By: Logan Bores M.D.   On: 03/18/2020 12:33    Donney Dice, DO 03/18/2020, 1:48 PM PGY-1, [place] Intern pager: [phone], text pages welcome  FPTS Upper-Level Resident Addendum   I have independently interviewed and examined the patient. I have discussed the above with the original author and agree with their documentation.Please see also any attending notes.    Carollee Leitz MD PGY-2, [facility] 03/18/2020 5:34 PM  FPTS Service pager: [phone] (text pages welcome through [place])

## 2020-03-18 NOTE — Consult Note (Signed)
Reason for Consult:Right hand infection Referring Physician: D Ray Time called: 1320 Time at bedside: Donald Bender is an 60 y.o. male.  HPI: Donald Bender comes in with a 3d hx/o right hand pain, swelling, and redness. He got a splinter in his long finger about the same time, otherwise no obvious antecedent event. He denies fevers, chills, sweats, N/V. He did have a similar episode in his foot after stepping on a toothpick that resolved with IV abx. He is [employer] and works in [employer]. He is an untreated diabetic.  Past Medical History:  Diagnosis Date  . Diabetes mellitus without complication ([place])   . Hypertension     History reviewed. No pertinent surgical history.  Family History  Problem Relation Age of Onset  . Leukemia Mother 92    Social History:  reports that he has been smoking. He has been smoking about 1.00 pack per day. He has never used smokeless tobacco. He reports current alcohol use of about 6.0 standard drinks of alcohol per week. He reports that he does not use drugs.  Allergies: No Known Allergies  Medications: I have reviewed the patient's current medications.  Results for orders placed or performed during the hospital encounter of 03/18/20 (from the past 48 hour(s))  CBG monitoring, ED     Status: Abnormal   Collection Time: 03/18/20 10:06 AM  Result Value Ref Range   Glucose-Capillary 238 (H) 70 - 99 mg/dL    Comment: Glucose reference range applies only to samples taken after fasting for at least 8 hours.  Lactic acid, plasma     Status: None   Collection Time: 03/18/20 10:09 AM  Result Value Ref Range   Lactic Acid, Venous 1.8 0.5 - 1.9 mmol/L    Comment: Performed at [facility] [address], [place], [place] [address]  Comprehensive metabolic panel     Status: Abnormal   Collection Time: 03/18/20 10:09 AM  Result Value Ref Range   Sodium 136 135 - 145 mmol/L   Potassium 4.2 3.5 - 5.1 mmol/L   Chloride 99 98 - 111 mmol/L   CO2 26 22 -  32 mmol/L   Glucose, Bld 195 (H) 70 - 99 mg/dL    Comment: Glucose reference range applies only to samples taken after fasting for at least 8 hours.   BUN 13 6 - 20 mg/dL   Creatinine, Ser 0.78 0.61 - 1.24 mg/dL   Calcium 8.8 (L) 8.9 - 10.3 mg/dL   Total Protein 7.0 6.5 - 8.1 g/dL   Albumin 3.0 (L) 3.5 - 5.0 g/dL   AST 16 15 - 41 U/L   ALT 17 0 - 44 U/L   Alkaline Phosphatase 59 38 - 126 U/L   Total Bilirubin 1.4 (H) 0.3 - 1.2 mg/dL   GFR, Estimated >60 >60 mL/min    Comment: (NOTE) Calculated using the CKD-EPI Creatinine Equation (2021)    Anion gap 11 5 - 15    Comment: Performed at [facility], [place] [address], [place], [place] [address]  CBC with Differential     Status: Abnormal   Collection Time: 03/18/20 10:09 AM  Result Value Ref Range   WBC 7.1 4.0 - 10.5 K/uL   RBC 6.36 (H) 4.22 - 5.81 MIL/uL   Hemoglobin 21.7 (HH) 13.0 - 17.0 g/dL    Comment: REPEATED TO VERIFY THIS CRITICAL RESULT HAS VERIFIED AND BEEN CALLED TO J. VASQUEZ, RN BY JULIE MACEDA DEL ANGEL ON 02 18  2022 AT 1128, AND HAS BEEN READ BACK.     HCT 64.2 (H) 39.0 - 52.0 %   MCV 100.9 (H) 80.0 - 100.0 fL   MCH 34.1 (H) 26.0 - 34.0 pg   MCHC 33.8 30.0 - 36.0 g/dL   RDW 12.6 11.5 - 15.5 %   Platelets PLATELET CLUMPS NOTED ON SMEAR, UNABLE TO ESTIMATE 150 - 400 K/uL    Comment: Immature Platelet Fraction may be clinically indicated, consider ordering this additional test [id number]    nRBC 0.0 0.0 - 0.2 %   Neutrophils Relative % 62 %   Neutro Abs 4.4 1.7 - 7.7 K/uL   Lymphocytes Relative 25 %   Lymphs Abs 1.8 0.7 - 4.0 K/uL   Monocytes Relative 8 %   Monocytes Absolute 0.5 0.1 - 1.0 K/uL   Eosinophils Relative 4 %   Eosinophils Absolute 0.3 0.0 - 0.5 K/uL   Basophils Relative 1 %   Basophils Absolute 0.1 0.0 - 0.1 K/uL   Immature Granulocytes 0 %   Abs Immature Granulocytes 0.02 0.00 - 0.07 K/uL    Comment: Performed at [facility], [address], [place], [place] [address]    DG Hand  2 View Right  Result Date: 03/18/2020 CLINICAL DATA:  Infection. Right hand pain, swelling, and redness for 3 days. No reported injury. EXAM: RIGHT HAND - 2 VIEW COMPARISON:  None. FINDINGS: No acute fracture or dislocation is identified. Mild degenerative spurring is noted at the thumb IP joint and PIP and DIP joints of multiple fingers. There is prominent spurring at the third MCP joint with milder spurring at the fourth MCP joint. There is prominent soft tissue swelling along the dorsal and ulnar aspects of the hand extending into the wrist. No osseous erosion, subcutaneous emphysema, radiopaque foreign body is identified. IMPRESSION: Soft tissue swelling without acute osseous abnormality identified. Electronically Signed   By: Logan Bores M.D.   On: 03/18/2020 12:33    Review of Systems  HENT: Negative for ear discharge, ear pain, hearing loss and tinnitus.   Eyes: Negative for photophobia and pain.  Respiratory: Negative for cough and shortness of breath.   Cardiovascular: Negative for chest pain.  Gastrointestinal: Negative for abdominal pain, nausea and vomiting.  Genitourinary: Negative for dysuria, flank pain, frequency and urgency.  Musculoskeletal: Positive for myalgias (Right hand/FA). Negative for back pain and neck pain.  Neurological: Negative for dizziness and headaches.  Hematological: Does not bruise/bleed easily.  Psychiatric/Behavioral: The patient is not nervous/anxious.    Blood pressure (!) 194/101, pulse 81, temperature 98 F (36.7 C), resp. rate 17, SpO2 93 %. Physical Exam Constitutional:      General: He is not in acute distress.    Appearance: He is well-developed and well-nourished. He is not diaphoretic.  HENT:     Head: Normocephalic and atraumatic.  Eyes:     General: No scleral icterus.       Right eye: No discharge.        Left eye: No discharge.     Conjunctiva/sclera: Conjunctivae normal.  Cardiovascular:     Rate and Rhythm: Normal rate and regular  rhythm.  Pulmonary:     Effort: Pulmonary effort is normal. No respiratory distress.  Musculoskeletal:     Cervical back: Normal range of motion.     Comments: Right shoulder, elbow, wrist, digits- no skin wounds, dorsum of hand edematous and erythematous without obvious fluctuance, mild-mod TTP with some extension into FA, no pain with  passive flex/ext of fingers except at extremes, no instability, no blocks to motion  Sens  Ax/M/U intact, R paresthetic  Mot   Ax/ R/ PIN/ M/ AIN/ U intact  Rad 2+  Skin:    General: Skin is warm and dry.  Neurological:     Mental Status: He is alert.  Psychiatric:        Mood and Affect: Mood and affect normal.        Behavior: Behavior normal.     Assessment/Plan: Right hand cellulitis -- Will get MRI to make sure there's no deep fluid collections but should do well with IV abx managed by medicine as well as treatment of his DM.    Lisette Abu, PA-C Orthopedic Surgery [phone] 03/18/2020, 1:42 PM

## 2020-03-18 NOTE — Progress Notes (Signed)
Pharmacy Antibiotic Note  Donald Bender is a 60 y.o. male admitted on 03/18/2020 with R hand cellulitis.  Pharmacy has been consulted for vancomycin dosing. Also asked to start ceftriaxone per MD. SCr 0.78.  Per discussion with patient - height 6'3" and estimated weight 260-270 lb, which is consistent with previous values in chart.  Plan: Ceftriaxone 2g IV q24h Vancomycin 2250mg  IV x 1; then 1500mg  IV q12h. Goal AUC 400-550. Expected AUC: 493 SCr used: 0.8 Monitor clinical progress, c/s, renal function F/u de-escalation plan/LOT, vancomycin levels as indicated      Temp (24hrs), Avg:98 F (36.7 C), Min:97.9 F (36.6 C), Max:98 F (36.7 C)  Recent Labs  Lab 03/18/20 1009  WBC 7.1  CREATININE 0.78  LATICACIDVEN 1.8    CrCl cannot be calculated (Unknown ideal weight.).    No Known Allergies   Arturo Morton, PharmD, BCPS Please check AMION for all [facility] contact numbers Clinical Pharmacist 03/18/2020 6:37 PM

## 2020-03-18 NOTE — ED Triage Notes (Signed)
Pt arrives to ED with chief compliant of right hand swelling. He states he got a wooden splinter in his right middle finger 3 days ago and then suddenly noticed redness and swelling to right hand that has progressed in wrist. Noted to be hypertensive in triage : he has been out of all medications for 1 year.

## 2020-03-18 NOTE — Progress Notes (Signed)
Pt arrived to unit. Alert and oriented X4. Pt states pain is 7/10. Hypertensive. BP is 221/111, pulse 95. Putting him in yellow mews. Patient asymptomatic to hypertension. Pt sitting up eating dinner. Handing off to Night shift. Will continue to monitor.

## 2020-03-19 ENCOUNTER — Observation Stay (HOSPITAL_COMMUNITY): Payer: Self-pay

## 2020-03-19 LAB — COMPREHENSIVE METABOLIC PANEL
ALT: 13 U/L (ref 0–44)
AST: 14 U/L — ABNORMAL LOW (ref 15–41)
Albumin: 2.5 g/dL — ABNORMAL LOW (ref 3.5–5.0)
Alkaline Phosphatase: 47 U/L (ref 38–126)
Anion gap: 8 (ref 5–15)
BUN: 12 mg/dL (ref 6–20)
CO2: 27 mmol/L (ref 22–32)
Calcium: 8.4 mg/dL — ABNORMAL LOW (ref 8.9–10.3)
Chloride: 100 mmol/L (ref 98–111)
Creatinine, Ser: 0.67 mg/dL (ref 0.61–1.24)
GFR, Estimated: >60 mL/min (ref >60)
Glucose, Bld: 117 mg/dL — ABNORMAL HIGH (ref 70–99)
Potassium: 3.6 mmol/L (ref 3.5–5.1)
Sodium: 135 mmol/L (ref 135–145)
Total Bilirubin: 1.6 mg/dL — ABNORMAL HIGH (ref 0.3–1.2)
Total Protein: 5.9 g/dL — ABNORMAL LOW (ref 6.5–8.1)

## 2020-03-19 LAB — GLUCOSE, CAPILLARY
Glucose-Capillary: 153 mg/dL — ABNORMAL HIGH (ref 70–99)
Glucose-Capillary: 183 mg/dL — ABNORMAL HIGH (ref 70–99)
Glucose-Capillary: 141 mg/dL — ABNORMAL HIGH (ref 70–99)
Glucose-Capillary: 211 mg/dL — ABNORMAL HIGH (ref 70–99)
Glucose-Capillary: 152 mg/dL — ABNORMAL HIGH (ref 70–99)

## 2020-03-19 LAB — CBC
HCT: 59.3 % — ABNORMAL HIGH (ref 39.0–52.0)
Hemoglobin: 20.2 g/dL — ABNORMAL HIGH (ref 13.0–17.0)
MCH: 34.1 pg — ABNORMAL HIGH (ref 26.0–34.0)
MCHC: 34.1 g/dL (ref 30.0–36.0)
MCV: 100.2 fL — ABNORMAL HIGH (ref 80.0–100.0)
Platelets: UNDETERMINED 10*3/uL (ref 150–400)
RBC: 5.92 MIL/uL — ABNORMAL HIGH (ref 4.22–5.81)
RDW: 12.4 % (ref 11.5–15.5)
WBC: 7.5 10*3/uL (ref 4.0–10.5)
nRBC: 0 % (ref 0.0–0.2)

## 2020-03-19 LAB — URINALYSIS, ROUTINE W REFLEX MICROSCOPIC
Bilirubin Urine: NEGATIVE
Glucose, UA: NEGATIVE mg/dL
Hgb urine dipstick: NEGATIVE
Ketones, ur: NEGATIVE mg/dL
Leukocytes,Ua: NEGATIVE
Nitrite: NEGATIVE
Protein, ur: 100 mg/dL — AB
Specific Gravity, Urine: 1.015 (ref 1.005–1.030)
pH: 6 (ref 5.0–8.0)

## 2020-03-19 LAB — MAGNESIUM: Magnesium: 1.7 mg/dL (ref 1.7–2.4)

## 2020-03-19 MED ORDER — IBUPROFEN 200 MG PO TABS
400.0000 mg | ORAL_TABLET | Freq: Three times a day (TID) | ORAL | Status: DC | PRN
  Filled 2020-03-19: qty 2

## 2020-03-19 MED ORDER — AMLODIPINE BESYLATE 5 MG PO TABS
5.0000 mg | ORAL_TABLET | Freq: Every day | ORAL | Status: DC
  Administered 2020-03-19: 5 mg via ORAL
  Filled 2020-03-19: qty 1

## 2020-03-19 MED ORDER — GADOBUTROL 1 MMOL/ML IV SOLN
10.0000 mL | Freq: Once | INTRAVENOUS | Status: AC | PRN
  Administered 2020-03-19: 10 mL via INTRAVENOUS

## 2020-03-19 MED ORDER — ACETAMINOPHEN 325 MG PO TABS: 650.0000 mg | ORAL_TABLET | Freq: Four times a day (QID) | ORAL | Status: AC

## 2020-03-19 NOTE — Progress Notes (Signed)
Patient ID: JAZPER NIKOLAI, male   DOB: December 29, 1960, 60 y.o.   MRN: [id number] Patient was seen at bedside.  Patient's labs are stable.  His swelling and inflammation is markedly improved.  MRI is negative for any deep space infection.  Once again this appears to be cellulitic change which is responsive to the antibiotics.  I see no complications.  I recommend 24 hours additionally of IV antibiotics and then we can move forward with outpatient antibiotics and follow-up in my office.  I have gone ahead and performed a comprehensive dressing change.  The patient is alert and oriented in no acute distress. The patient complains of pain in the affected upper extremity.  The patient is noted to have a normal HEENT exam. Lung fields show equal chest expansion and no shortness of breath. Abdomen exam is nontender without distention. Lower extremity examination does not show any fracture dislocation or blood clot symptoms. Pelvis is stable and the neck and back are stable and nontender.   He is in excellent spirits.  Overall he looks quite nicely.  I do feel he is responding favorably.  He should be set for discharge tomorrow but I do feel it is appropriate to go ahead and continue the antibiotics until tomorrow morning when we can discharge him.  Should any problems occur patient will notify us.  Chanel Mckesson MD

## 2020-03-19 NOTE — Progress Notes (Signed)
Patient asymptomatic with BP 184/100. MD  notifed will continue to monitor

## 2020-03-19 NOTE — Hospital Course (Addendum)
Donald Bender is a 60 y.o. male presenting with new onset right hand pain. PMH is significant for hypertension, type 2 DM, rosacea, alcohol use and tobacco use.   Localized erythema of dominant right hand: Stable Presented with 3 day onset of right hand pain and swelling that has spread to his mid-forearm which he believes started after a splinter in his finger. Noted to have worsening localized erythema on exam with significant associated edema. On presentation, patient noted to be afebrile, lactic acid wnl and without evidence of leukocytosis. XR right hand noted to have soft tissue swelling without acute osseous abnormality identified.  Patient received IV cefazolin 2g in the ED. Patient was transitioned to IV vancomycin and ceftriaxone.  Orthopedic surgery was consulted and recommended an MRI of the right hand.  MRI showed no signs of osteomyelitis or tenosynovitis.  Patient's antibiotics were transitioned to Keflex at the time of discharge with recommendation for him to continue them for 10 days.  At the time of discharge, he had improvement in pain, redness, and swelling while remaining afebrile throughout his hospitalization.    PCP follow-up recommendations: 1.  Recommend evaluation within 1 week to ensure patient's hand continues to improve. 2.  Patient noted to have a hemoglobin of 21.7 on admission which may be patient's baseline, recommend outpatient follow-up.  Should have EPO and overnight sleep study as outpatient. 3.  Patient noted to have greatly elevated blood pressures often in the 482-707 systolic range during hospitalization.  Patient was started on amlodipine 10 mg daily as well as losartan 25 mg daily.  Recommend PCP follow-up for blood pressure check in 1 week. 4.  Patient was monitored for alcohol withdrawal due to history of drinking approximately 3 drinks per day, recommend PCP discussion on cessation. 5.  Patient 2 pack/day smoker, likely related to patient's elevated  hemoglobin.  Recommend ongoing discussions of cessation. 6. UA with proteinuria to 100 on admission. Recommend repeat UA at f/u and further work up as necessary. 7.  Blood cultures were obtained on admission.  At the time of discharge, they showed no growth at ***.  Please ensure follow up of these cultures. 8. Continue to encourage smoking cessation.  He was discharged with nicotine patch 21mg . 9. No ibuprofen or other NSAIDs. 10. Patient may benefit from hematology/oncology outpatient.  11. Patient discharged on Keflex 500 mg qid for 10 day course, ensure completion of antibiotic therapy.

## 2020-03-19 NOTE — Progress Notes (Addendum)
Family Medicine Teaching Service Daily Progress Note Intern Pager: [phone]  Patient name: Donald Bender Medical record number: [id number] Date of birth: 19-Oct-1960 Age: 60 y.o. Gender: male  Primary Care Provider: Charlott Rakes, MD Consultants: Orthopedic surgery Code Status: Full  Pt Overview and Major Events to Date:  2/18 admitted  Assessment and Plan: Donald Bender is a 60 y.o. male presenting with right hand cellulitis, improving on IV antibiotics. PMH is significant for hypertension, type 2 DM, rosacea, alcohol use and tobacco use.  Right hand cellulitis: Improving Pain improving, afebrile overnight.  Treating with IV antibiotics per ortho recommendations, started 2300 on 2/18.  MRI negative for osteo and tenosynovitis.  Per ortho note on 2/19, likely able to d/c today and send with PO antibiotics.  Will plan for PO abx, but will f/u ortho formal recommendations for choice of abx and duration. -Continue ceftriaxone 2 g IV every 24 and vancomycin 1.5 g every 12 (2/18 - ) -Consider switch to p.o. this AM, pending ortho recs - f/u ortho as outpatient -Blood culture x2: NGTD  -PT/OT: no f/u - no PCP currently, but has seen [facility], last in 2018 and would like to re-establish with them, TOC consult placed to assist  Elevated Hemoglobin: Stable Hgb this AM 19.7.  Likely 2/2 smoking, also consider OSA -IVF d/c'ed overnight as PO intake good and Hgb likely chronic, also plan for d/c today -outpatient monitoring, EPO, sleepy study recommended  Hyperbilirubinemia  Likely in setting of elevated production and therefore destruction of RBCs.  Total bili this AM 1.3.  Indirect, 1.1.  Direct, 0.2.  Likely increased hemolysis load from high Hgb.  - work up outpatient per above  Proteinuria Noted on UA on admission. - recommend repeat outpatient  Hypertension Per chart review, patient has longstanding history of uncontrolled hypertension, does not take any  medications at home.  BP this AM 191/96. -Continue losartan 25 mg daily (2/18 - ) - increase norvasc to 5 mg -Monitor BP -Ensure outpatient follow-up, will not be treating these pressures acutely inpatient  Type 2 DM: Stable A1c on admission 7.2.  Before admission, patient had not taken previously prescribed Metformin in over a year.  CBGs 139-211 in last 24 hrs.  -Continue Metformin XR 500 mg daily  Rosacea  Chronic and stable. Present on exam, reports chronic history of this for years now. 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   Alcohol use  CIWAs not charted since 2/18, but 0 at that time.  Per patient report, last drink 10 PM 2/17.  No concerns for withdrawal on exam. -Continue seizure precautions -Thiamine, multivitamin, folic acid daily -Monitor for withdrawal, perform CIWA's  Tobacco use  Current smoker, smokes 2 packs/day -nicotine patch 21 mg ordered, states that it helps and he would like as outpatient -encourage smoking cessation  Gout Per patient, has colchicine as home med for acute flares.  Uric acid 2/17 normal at 7.6. -Holding colchicine  Hyperlipidemia  Last full panel 3 years ago.  Was prescribed atorvastatin 40 mg but reports non compliance.  Lipid panel yesterday (2/18) significant for triglycerides 152, LDL 112. -Initiate statin therapy at discharge    FEN/GI: Carb modified PPx: Heparin subcu   Status is: Observation  The patient remains OBS appropriate and will d/c before 2 midnights.  Dispo:  Patient From: Home  Planned Disposition: Home  Expected discharge date: 03/20/2020  Medically stable for discharge: No       Subjective:  He denies complaints this  AM.  States that he is feeling that his hand is much improved.   Objective: Temp:  [97.9 F (36.6 C)-98.5 F (36.9 C)] 97.9 F (36.6 C) (02/20 0400) Pulse Rate:  [77-81] 79 (02/20 0400) Resp:  [17] 17 (02/20 0400) BP: (184-191)/(96-108) 191/96 (02/20 0400) SpO2:  [92 %-95 %] 92 %  (02/20 0400)  Physical Exam:  General: 60 y.o. male in NAD HEENT: rhinophyma Cardio: RRR no m/r/g Lungs: CTAB, no wheezing, no rhonchi, no crackles, no IWOB on RA Skin: warm and dry Extremities: right hand wrapped from knuckles to elbow, would like to keep wrapped for now, will be examined by ortho, fingers non-erythematous and he is able to move them   Laboratory: Recent Labs  Lab 03/18/20 1009 03/19/20 0356 03/20/20 0301  WBC 7.1 7.5 7.3  HGB 21.7* 20.2* 19.7*  HCT 64.2* 59.3* 56.4*  PLT PLATELET CLUMPS NOTED ON SMEAR, UNABLE TO ESTIMATE PLATELET CLUMPS NOTED ON SMEAR, UNABLE TO ESTIMATE PLATELET CLUMPS NOTED ON SMEAR, UNABLE TO ESTIMATE   Recent Labs  Lab 03/18/20 1009 03/19/20 0356 03/20/20 0301  NA 136 135 135  K 4.2 3.6 3.8  CL 99 100 101  CO2 26 27 25   BUN 13 12 9   CREATININE 0.78 0.67 0.63  CALCIUM 8.8* 8.4* 8.6*  PROT 7.0 5.9* 6.0*  5.8*  BILITOT 1.4* 1.6* 1.3*  1.4*  ALKPHOS 59 47 45  45  ALT 17 13 12  11   AST 16 14* 14*  15  GLUCOSE 195* 117* 139*    Imaging/Diagnostic Tests: MR HAND RIGHT W WO CONTRAST  Result Date: 03/19/2020 CLINICAL DATA:  Diabetic patient with right hand pain, swelling and redness developing over the past 3 days since the patient gouty splinter in the distal right long finger. EXAM: MRI OF THE RIGHT HAND WITHOUT AND WITH CONTRAST TECHNIQUE: Multiplanar, multisequence MR imaging of the right hand was performed before and after the administration of intravenous contrast. CONTRAST:  10 mL GADAVIST IV SOLN COMPARISON:  Plain films right hand 03/18/2020. FINDINGS: Bones/Joint/Cartilage No marrow edema or enhancement to suggest osteomyelitis. No joint effusion. Scattered, mild osteoarthritis noted. Ligaments Intact. Muscles and Tendons Intact.  No evidence of septic tenosynovitis. Soft tissues Subcutaneous edema and enhancement are present about the hand. No abscess. No foreign body is visualized. IMPRESSION: Findings consistent with  cellulitis about the hand. Negative for septic tenosynovitis, osteomyelitis, myositis or septic joint. Electronically Signed   By: Inge Rise M.D.   On: 03/19/2020 12:02     Kea Callan, Bernita Raisin, DO 03/20/2020, 5:48 AM PGY-3, [place] Intern pager: [phone], text pages welcome

## 2020-03-19 NOTE — Evaluation (Signed)
Physical Therapy Evaluation & Discharge Patient Details Name: Donald Bender MRN: [id number] DOB: Jun 19, 1960 Today's Date: 03/19/2020   History of Present Illness  60 y.o. male presenting with new onset right hand pain, currently being treated as cellulitis. PMH is significant for hypertension, type 2 DM, rosacea, alcohol use and tobacco use.  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 Patient overall modI for mobility with no AD. No further skilled PT needs at this time. No PT follow up recommended at this time.     Follow Up Recommendations No PT follow up    Equipment Recommendations  None recommended by PT    Recommendations for Other Services       Precautions / Restrictions Precautions Precautions: Fall Restrictions Weight Bearing Restrictions: No      Mobility  Bed Mobility Overal bed mobility: Modified Independent                  Transfers Overall transfer level: Modified independent Equipment used: None                Ambulation/Gait Ambulation/Gait assistance: Modified independent (Device/Increase time) Gait Distance (Feet): 500 Feet Assistive device: None Gait Pattern/deviations: WFL(Within Functional Limits)        Stairs Stairs: Yes Stairs assistance: Modified independent (Device/Increase time) Stair Management: One rail Left;Step to pattern;Forwards Number of Stairs: 2    Wheelchair Mobility    Modified Rankin (Stroke Patients Only)       Balance Overall balance assessment: Mild deficits observed, not formally tested                                           Pertinent Vitals/Pain Pain Assessment: Faces Faces Pain Scale: Hurts little more Pain Location: R hand Pain Descriptors / Indicators: Aching Pain Intervention(s): Monitored during session    Home Living Family/patient expects to be discharged to:: Private residence Living Arrangements:  Spouse/significant other Available Help at Discharge: Family;Available 24 hours/day Type of Home: House Home Access: Stairs to enter Entrance Stairs-Rails: Left Entrance Stairs-Number of Steps: 2 Home Layout: One level Home Equipment: Zebulon Gantt - 2 wheels;Cane - single point;Shower seat      Prior Function Level of Independence: Independent         Comments: working in Electronics engineer        Extremity/Trunk Assessment   Upper Extremity Assessment Upper Extremity Assessment: Defer to OT evaluation    Lower Extremity Assessment Lower Extremity Assessment: Overall WFL for tasks assessed       Communication   Communication: No difficulties  Cognition Arousal/Alertness: Awake/alert Behavior During Therapy: WFL for tasks assessed/performed Overall Cognitive Status: Within Functional Limits for tasks assessed                                        General Comments General comments (skin integrity, edema, etc.): BP in supine 193/99 with HR 81    Exercises     Assessment/Plan    PT Assessment Patent does not need any further PT services  PT Problem List         PT Treatment Interventions      PT Goals (Current goals can be found in the Care Plan section)  Acute Rehab PT Goals  Patient Stated Goal: to go home    Frequency     Barriers to discharge        Co-evaluation               AM-PAC PT "6 Clicks" Mobility  Outcome Measure Help needed turning from your back to your side while in a flat bed without using bedrails?: None Help needed moving from lying on your back to sitting on the side of a flat bed without using bedrails?: None Help needed moving to and from a bed to a chair (including a wheelchair)?: None Help needed standing up from a chair using your arms (e.g., wheelchair or bedside chair)?: None Help needed to walk in hospital room?: None Help needed climbing 3-5 steps with a railing? : None 6 Click Score:  24    End of Session   Activity Tolerance: Patient tolerated treatment well Patient left: in chair;with call bell/phone within reach Nurse Communication: Mobility status PT Visit Diagnosis: Unsteadiness on feet (R26.81)    Time: 4158-3094 PT Time Calculation (min) (ACUTE ONLY): 21 min   Charges:   PT Evaluation $PT Eval Low Complexity: 1 Low          [name] A. Gilford Rile PT, DPT Acute Rehabilitation Services Pager [phone] Office [phone]   [name] 03/19/2020, 9:49 AM

## 2020-03-19 NOTE — Plan of Care (Incomplete)

## 2020-03-19 NOTE — Progress Notes (Signed)
Occupational Therapy Evaluation & Discharge Patient Details Name: Donald Bender MRN: [id number] DOB: Oct 23, 1960 Today's Date: 03/19/2020    History of Present Illness 59 y.o. male presenting with new onset right hand pain, currently being treated as cellulitis. PMH is significant for hypertension, type 2 DM, rosacea, alcohol use and tobacco use.   Clinical Impression   PTA pt PLOF living at home with friend, I with ADLs and IADls, currently working In Architect. Currently, pt reported pain and swelling of R hand, however, during session swelling and pain has improved. Pt educated with elevation and light ROM of hand. I to Mod I for ADLs and functional transfers. No further skilled OT required at this time. No follow up OT recommended.     Follow Up Recommendations  No OT follow up    Equipment Recommendations       Recommendations for Other Services       Precautions / Restrictions Precautions Precautions: Fall Restrictions Weight Bearing Restrictions: No      Mobility Bed Mobility Overal bed mobility: Modified Independent             General bed mobility comments: sitting in chair upon entry with visitor.    Transfers Overall transfer level: Modified independent Equipment used: None                  Balance Overall balance assessment: Mild deficits observed, not formally tested                                         ADL either performed or assessed with clinical judgement   ADL Overall ADL's : Independent                                       General ADL Comments: no assistance required for LB dressing of donning and doffing of sock, as well as grooming/oral care sink level, and toilet transfers. Increased time required to limit pain. no c/o pain this session.     Vision         Perception     Praxis      Pertinent Vitals/Pain Pain Assessment: Faces Faces Pain Scale: Hurts little more Pain Location: R  hand Pain Descriptors / Indicators: Aching Pain Intervention(s): Monitored during session     Hand Dominance Right   Extremity/Trunk Assessment Upper Extremity Assessment Upper Extremity Assessment: Defer to OT evaluation;Overall WFL for tasks assessed;RUE deficits/detail RUE Deficits / Details: localised erythemia, and edema, edema and pain has improved.   Lower Extremity Assessment Lower Extremity Assessment: Overall WFL for tasks assessed       Communication Communication Communication: No difficulties   Cognition Arousal/Alertness: Awake/alert Behavior During Therapy: WFL for tasks assessed/performed Overall Cognitive Status: Within Functional Limits for tasks assessed                                     General Comments       Exercises Exercises:  (educated to perform ROM of digits and elevation of R hand/wrist to prevent swelling.)   Shoulder Instructions      Home Living Family/patient expects to be discharged to:: Private residence Living Arrangements: Spouse/significant other Available Help at Discharge: Family Type of Home: House  Home Access: Stairs to enter Entrance Stairs-Number of Steps: 2 Entrance Stairs-Rails: Left Home Layout: One level     Bathroom Shower/Tub: Teacher, early years/pre: Standard Bathroom Accessibility: Yes   Home Equipment: Environmental consultant - 2 wheels;Cane - single point;Shower seat          Prior Functioning/Environment Level of Independence: Independent        Comments: working in Film/video editor Problem List: Pain;Increased edema      OT Treatment/Interventions:      OT Goals(Current goals can be found in the care plan section) Acute Rehab OT Goals Patient Stated Goal: to go home OT Goal Formulation: With patient Time For Goal Achievement: 04/02/20 Potential to Achieve Goals: Good  OT Frequency:     Barriers to D/C:            Co-evaluation              AM-PAC OT "6  Clicks" Daily Activity     Outcome Measure Help from another person eating meals?: None Help from another person taking care of personal grooming?: None Help from another person toileting, which includes using toliet, bedpan, or urinal?: None Help from another person bathing (including washing, rinsing, drying)?: None Help from another person to put on and taking off regular upper body clothing?: None Help from another person to put on and taking off regular lower body clothing?: None 6 Click Score: 24   End of Session Nurse Communication: Mobility status  Activity Tolerance: Patient tolerated treatment well Patient left: in chair;with call bell/phone within reach;with family/visitor present;with nursing/sitter in room  OT Visit Diagnosis: Pain                Time: 4098-1191 OT Time Calculation (min): 11 min Charges:  OT General Charges $OT Visit: 1 Visit OT Evaluation $OT Eval Low Complexity: [place], MSOT, OTR/L  Supplemental Rehabilitation Services  [phone]   Marius Ditch 03/19/2020, 10:40 AM

## 2020-03-19 NOTE — Progress Notes (Addendum)
Family Medicine Teaching Service Daily Progress Note Intern Pager: [phone]  Patient name: Donald Bender Medical record number: [id number] Date of birth: 1960/09/10 Age: 60 y.o. Gender: male  Primary Care Provider: Charlott Rakes, MD Consultants: Orthopedic surgery Code Status: Full  Pt Overview and Major Events to Date:  2/18 admitted  Assessment and Plan: Donald Bender is a 60 y.o. male presenting with new onset right hand pain, currently being treated as presumptive cellulitis. PMH is significant for hypertension, type 2 DM, rosacea, alcohol use and tobacco use.  Localized erythema of dominant right hand: Stable Patient did well overnight.  Initially reported 8/10 hand pain unresponsive to Tylenol, subsequently relieved by ibuprofen.  Overnight, patient's hand was wrapped and elevated with supportive orthopedic foam.  Orthopedics have been consulted, concur with cellulitis and declined orthopedic treatment at this time.  S/p IV cefazolin 2 g (2/18).  Uric acid normal at 7.6.  S/p Tdap 2/18. -Continue ceftriaxone 2 g IV every 24 and vancomycin 1.5 g every 12 (2/18 - ) -Consider switch to p.o. doxy after at least 48 hours on IV antibiotics -MRI right hand ordered -UA ordered, pending collection -Blood culture x2 in process -While low on differential, consider STI testing for GC arthritis if symptoms worsen -PT/OT -Continue incentive spirometer  Elevated Hemoglobin: Stable Hemoglobin this morning 20.2, down from 21.7 on admission.  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 -Continue IV fluids at 150 mL/hour -Continue a.m. CBCs  Hypertension Per chart review, patient has longstanding history of uncontrolled hypertension, does not take any medications at home.  Blood pressures overnight 180-227/94-134.  Pulses 88-95. -Continue losartan 25 mg daily (2/18 - ) -Start amlodipine 5 mg daily (2/19 - ) -Monitor BP -Ensure outpatient  follow-up, will not be treating these pressures acutely inpatient  Type 2 DM: Stable CBG loss 24 hours 117, 153.  Hemoglobin A1c (2/18) measured 7.2.  Before admission, patient had not taken previously prescribed Metformin in over a year.   -Continue Metformin XR 500 mg daily  Rosacea  Chronic and stable. Present on exam, reports chronic history of this for years now. 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   Alcohol use  CIWA overnight score 0.  Per patient report, last drink 10 PM 2/17. -Continue seizure precautions -Thiamine, multivitamin, folic acid daily -Monitor for withdrawal, perform CIWA's  Tobacco use  Current smoker, smokes 2 packs/day -nicotine patch 21 mg ordered -encourage smoking cessation  Gout Per patient, has colchicine as home med for acute flares.  Uric acid 2/17 normal at 7.6. -Holding colchicine  Hyperlipidemia  Last full panel 3 years ago.  Was prescribed atorvastatin 40 mg but reports non compliance.  Lipid panel yesterday (2/18) significant for triglycerides 152, LDL 112. -Initiate statin therapy at discharge    FEN/GI: Carb modified PPx: Heparin subcu   Status is: Observation  The patient remains OBS appropriate and will d/c before 2 midnights.  Dispo:  Patient From: Home  Planned Disposition: Home  Expected discharge date: 03/20/2020  Medically stable for discharge: No       Subjective:  Patient was found laying in bed comfortably watching television.  Right arm elevated with foam blocks, wrapped from knuckles to mid forearm.  Patient reports right hand pain overnight nonresponsive to Tylenol, however responded to ibuprofen 400 mg well.    Objective: Temp:  [97.8 F (36.6 C)-99.6 F (37.6 C)] 97.8 F (36.6 C) (02/19 0333) Pulse Rate:  [81-99] 82 (02/19 0333) Resp:  [13-22]  16 (02/19 0333) BP: (180-227)/(94-134) 180/94 (02/19 0333) SpO2:  [90 %-97 %] 91 % (02/19 0333)  Physical Exam General: Awake, alert, oriented, no acute  distress Respiratory: Unlabored respirations, speaking in full sentences, no respiratory distress Extremities: Moving all extremities spontaneously, right arm and hand erythema within lines drawn on admission, no evidence of erythema extension Neuro: Cranial nerves II through X grossly intact Psych: Normal insight and judgement            Laboratory: Recent Labs  Lab 03/18/20 1009 03/19/20 0356  WBC 7.1 7.5  HGB 21.7* 20.2*  HCT 64.2* 59.3*  PLT PLATELET CLUMPS NOTED ON SMEAR, UNABLE TO ESTIMATE PLATELET CLUMPS NOTED ON SMEAR, UNABLE TO ESTIMATE   Recent Labs  Lab 03/18/20 1009  NA 136  K 4.2  CL 99  CO2 26  BUN 13  CREATININE 0.78  CALCIUM 8.8*  PROT 7.0  BILITOT 1.4*  ALKPHOS 59  ALT 17  AST 16  GLUCOSE 195*    Imaging/Diagnostic Tests: RIGHT HAND - 2 VIEW 03/18/2020  COMPARISON:  None. FINDINGS: No acute fracture or dislocation is identified. Mild degenerative spurring is noted at the thumb IP joint and PIP and DIP joints of multiple fingers. There is prominent spurring at the third MCP joint with milder spurring at the fourth MCP joint. There is prominent soft tissue swelling along the dorsal and ulnar aspects of the hand extending into the wrist. No osseous erosion, subcutaneous emphysema, radiopaque foreign body is identified. IMPRESSION: Soft tissue swelling without acute osseous abnormality identified.  CHEST - 2 VIEW 03/18/2020 COMPARISON:  None. FINDINGS: The cardiomediastinal silhouette is normal in contour. No pleural effusion. No pneumothorax. No acute pleuroparenchymal abnormality. Visualized abdomen is unremarkable. Mild degenerative changes of the thoracic spine. IMPRESSION: No acute cardiopulmonary abnormality.    Ezequiel Essex, MD 03/19/2020, 5:00 AM PGY-1, [place] Intern pager: [phone], text pages welcome

## 2020-03-20 ENCOUNTER — Other Ambulatory Visit: Payer: Self-pay

## 2020-03-20 DIAGNOSIS — E119 Type 2 diabetes mellitus without complications: Secondary | ICD-10-CM

## 2020-03-20 DIAGNOSIS — I10 Essential (primary) hypertension: Secondary | ICD-10-CM

## 2020-03-20 DIAGNOSIS — D751 Secondary polycythemia: Secondary | ICD-10-CM

## 2020-03-20 LAB — COMPREHENSIVE METABOLIC PANEL
ALT: 11 U/L (ref 0–44)
AST: 15 U/L (ref 15–41)
Albumin: 2.6 g/dL — ABNORMAL LOW (ref 3.5–5.0)
Alkaline Phosphatase: 45 U/L (ref 38–126)
Anion gap: 9 (ref 5–15)
BUN: 9 mg/dL (ref 6–20)
CO2: 25 mmol/L (ref 22–32)
Calcium: 8.6 mg/dL — ABNORMAL LOW (ref 8.9–10.3)
Chloride: 101 mmol/L (ref 98–111)
Creatinine, Ser: 0.63 mg/dL (ref 0.61–1.24)
GFR, Estimated: >60 mL/min (ref >60)
Glucose, Bld: 139 mg/dL — ABNORMAL HIGH (ref 70–99)
Potassium: 3.8 mmol/L (ref 3.5–5.1)
Sodium: 135 mmol/L (ref 135–145)
Total Bilirubin: 1.4 mg/dL — ABNORMAL HIGH (ref 0.3–1.2)
Total Protein: 5.8 g/dL — ABNORMAL LOW (ref 6.5–8.1)

## 2020-03-20 LAB — CBC
HCT: 56.4 % — ABNORMAL HIGH (ref 39.0–52.0)
Hemoglobin: 19.7 g/dL — ABNORMAL HIGH (ref 13.0–17.0)
MCH: 34.6 pg — ABNORMAL HIGH (ref 26.0–34.0)
MCHC: 34.9 g/dL (ref 30.0–36.0)
MCV: 98.9 fL (ref 80.0–100.0)
Platelets: UNDETERMINED 10*3/uL (ref 150–400)
RBC: 5.7 MIL/uL (ref 4.22–5.81)
RDW: 12.6 % (ref 11.5–15.5)
WBC: 7.3 10*3/uL (ref 4.0–10.5)
nRBC: 0 % (ref 0.0–0.2)

## 2020-03-20 LAB — HEPATIC FUNCTION PANEL
ALT: 12 U/L (ref 0–44)
AST: 14 U/L — ABNORMAL LOW (ref 15–41)
Albumin: 2.6 g/dL — ABNORMAL LOW (ref 3.5–5.0)
Alkaline Phosphatase: 45 U/L (ref 38–126)
Bilirubin, Direct: 0.2 mg/dL (ref 0.0–0.2)
Indirect Bilirubin: 1.1 mg/dL — ABNORMAL HIGH (ref 0.3–0.9)
Total Bilirubin: 1.3 mg/dL — ABNORMAL HIGH (ref 0.3–1.2)
Total Protein: 6 g/dL — ABNORMAL LOW (ref 6.5–8.1)

## 2020-03-20 LAB — GLUCOSE, CAPILLARY
Glucose-Capillary: 154 mg/dL — ABNORMAL HIGH (ref 70–99)
Glucose-Capillary: 223 mg/dL — ABNORMAL HIGH (ref 70–99)
Glucose-Capillary: 146 mg/dL — ABNORMAL HIGH (ref 70–99)

## 2020-03-20 MED ORDER — SODIUM CHLORIDE 0.9 % IV SOLN
INTRAVENOUS | Status: DC | PRN
  Administered 2020-03-20: 1000 mL via INTRAVENOUS

## 2020-03-20 MED ORDER — NAPROXEN 500 MG PO TABS: 500.0000 mg | ORAL_TABLET | Freq: Two times a day (BID) | ORAL | 0 refills | Status: AC

## 2020-03-20 MED ORDER — AMLODIPINE BESYLATE 10 MG PO TABS
10.0000 mg | ORAL_TABLET | Freq: Every day | ORAL | Status: DC
  Administered 2020-03-20: 10 mg via ORAL
  Filled 2020-03-20: qty 1

## 2020-03-20 MED ORDER — NICOTINE 21 MG/24HR TD PT24: 21.0000 mg | MEDICATED_PATCH | Freq: Every day | TRANSDERMAL | 0 refills | Status: DC

## 2020-03-20 MED ORDER — AMLODIPINE BESYLATE 10 MG PO TABS: 10.0000 mg | ORAL_TABLET | Freq: Every day | ORAL | 0 refills | Status: DC

## 2020-03-20 MED ORDER — METFORMIN HCL ER 500 MG PO TB24: 500.0000 mg | ORAL_TABLET | Freq: Every day | ORAL | 1 refills | Status: DC

## 2020-03-20 MED ORDER — LOSARTAN POTASSIUM 25 MG PO TABS: 25.0000 mg | ORAL_TABLET | Freq: Every day | ORAL | 0 refills | Status: DC

## 2020-03-20 MED ORDER — CEPHALEXIN 500 MG PO CAPS: 500.0000 mg | ORAL_CAPSULE | Freq: Four times a day (QID) | ORAL | 0 refills | Status: AC

## 2020-03-20 MED ORDER — ATORVASTATIN CALCIUM 40 MG PO TABS: 40.0000 mg | ORAL_TABLET | Freq: Every day | ORAL | 1 refills | Status: DC

## 2020-03-20 NOTE — Progress Notes (Signed)
Donald Bender to be Discharged home per MD order.  Discussed prescriptions and follow up appointments with the patient. Prescriptions sent to patients preferred pharmacy, medication list explained in detail. Pt verbalized understanding.  Allergies as of 03/20/2020   No Known Allergies     Medication List    STOP taking these medications   fenofibrate 160 MG tablet   ibuprofen 200 MG tablet Commonly known as: ADVIL   lisinopril 40 MG tablet Commonly known as: ZESTRIL     TAKE these medications   amLODipine 10 MG tablet Commonly known as: NORVASC Take 1 tablet (10 mg total) by mouth daily. Start taking on: March 21, 2020 What changed:   medication strength  how much to take   aspirin EC 81 MG tablet Take 1 tablet (81 mg total) by mouth daily.   atorvastatin 40 MG tablet Commonly known as: LIPITOR Take 1 tablet (40 mg total) by mouth daily. For cholesterol   cephALEXin 500 MG capsule Commonly known as: KEFLEX Take 1 capsule (500 mg total) by mouth 4 (four) times daily for 10 days.   colchicine 0.6 MG tablet TAKE 2 TABLETS BY MOUTH AT THE ONSET OF A GOUT ATTACK, MAY REPEAT 1 TABLET IN 2 HOURS IF SYMPTOMS PERSIST. What changed:   how much to take  how to take this  when to take this  additional instructions   losartan 25 MG tablet Commonly known as: COZAAR Take 1 tablet (25 mg total) by mouth daily.   metFORMIN 500 MG 24 hr tablet Commonly known as: GLUCOPHAGE-XR Take 1 tablet (500 mg total) by mouth daily with breakfast.   naproxen 500 MG tablet Commonly known as: Naprosyn Take 1 tablet (500 mg total) by mouth 2 (two) times daily with a meal.   nicotine 21 mg/24hr patch Commonly known as: NICODERM CQ - dosed in mg/24 hours Place 1 patch (21 mg total) onto the skin daily.       Vitals:   03/20/20 0400 03/20/20 0900  BP: (!) 191/96 (!) 196/102  Pulse: 79 78  Resp: 17 16  Temp: 97.9 F (36.6 C) 98.3 F (36.8 C)  SpO2: 92% 96%     IV  catheter discontinued, Site without signs and symptoms of complications. Dressing and pressure applied. Telemetry discontinued. Pt denies pain at this time. No complaints noted.  An After Visit Summary was printed and given to the patient. Patient escorted via RN, and Discharged home via private auto with friend.  [place] 03/20/2020 3:48 PM

## 2020-03-20 NOTE — Progress Notes (Signed)
Patient ID: Donald Bender, male   DOB: [birth year], 60 y.o.   MRN: [id number] Patient is here for follow-up examination.  Patient is doing quite well.  Erythema and cellulitic changes resolved nicely   I reviewed all issues with patient at length and the findings.  At present juncture the patient has excellent range of motion and good motor control and function.  Radial median and ulnar nerves are intact to sensation and motor function.  No signs of compartment syndrome or necrotizing fasciitis type issues.  We discussed these issues in detail.  Lower extremity examination is benign.  Upper extremity examination is markedly improved on the right and the left side is atraumatic.  I recommend discharge home on antibiotics for 10 days.  All questions have been addressed.  I will call the medicine service to discuss and report accordingly.  Final diagnosis advanced cellulitic change right hand with resolution with antibiotic administration and wound care.  [place] MD

## 2020-03-20 NOTE — Progress Notes (Signed)
Continuous fluids discontinued, New order for Amlodipine added for AM meds. No prn BP meds order at this time.MD aware of elevated BP.

## 2020-03-20 NOTE — Discharge Instructions (Signed)
Please follow up with your primary care doctor in 1 week.  You should continue to take your antibiotic Keflex 500 mg four times a day until 03/30/2020.  If you have worsening of pain, redness, drainage, or develop fevers, you should be seen immediately by a physician.  You hemoglobin was elevated when you can to the hospital.  This could be because of smoking, but could also be from sleep apnea.  We will recommend to your doctor that you have a sleep study in the future and some blood work to monitor this and look for other causes.  You were started on blood pressure medications while you were here as well.  You should take losartan 25 mg and amlodipine 10 mg daily.  These medications are best taken at night.  Monitor your blood pressure at home if you have a cuff.  Make sure you see your doctor regularly to monitor this.  Please do not take ibuprofen or other NSAIDs, you may instead take naproxen 500 mg bid daily.   We have also restarted your diabetes medication, metformin, and cholesterol medication, atorvastatin.  You should take these every day.  Try to continue working on stopping smoking and drinking as much as possible.

## 2020-03-20 NOTE — Discharge Summary (Signed)
[facility] Discharge Summary  Patient name: Donald Bender Medical record number: [id number] Date of birth: [birth year] Age: 60 y.o. Gender: male Date of Admission: 03/18/2020  Date of Discharge: 03/20/2020 Admitting Physician: Donney Dice, DO  Primary Care Provider: Charlott Rakes, MD Consultants: Orthopedic surgery  Indication for Hospitalization: right hand edema and erythema   Discharge Diagnoses/Problem List:  Cellulitis of right hand Polycythemia  Proteinuria Uncontrolled hypertension Type 2 DM Rosacea Gout Hyperlipidemia  Alcohol and tobacco use   Disposition: home  Discharge Condition: medically stable  Discharge Exam:  Temp:  [97.9 F (36.6 C)-98.5 F (36.9 C)] 97.9 F (36.6 C) (02/20 0400) Pulse Rate:  [77-81] 79 (02/20 0400) Resp:  [17] 17 (02/20 0400) BP: (184-191)/(96-108) 191/96 (02/20 0400) SpO2:  [92 %-95 %] 92 % (02/20 0400)  Physical Exam:  General: 60 y.o. male in NAD HEENT: rhinophyma Cardio: RRR no m/r/g Lungs: CTAB, no wheezing, no rhonchi, no crackles, no IWOB on RA Skin: warm and dry Extremities: right hand wrapped from knuckles to elbow, would like to keep wrapped for now, will be examined by ortho, fingers non-erythematous and he is able to move them  Physical exam performed by Dr. Sandi Carne.  Brief Hospital Course:  Donald Bender is a 60 y.o. male presenting with new onset right hand pain. PMH is significant for hypertension, type 2 DM, rosacea, alcohol use and tobacco use.   Localized erythema of dominant right hand: Stable Presented with 3 day onset of right hand pain and swelling that has spread to his mid-forearm which he believes started after a splinter in his finger. Noted to have worsening localized erythema on exam with significant associated edema. On presentation, patient noted to be afebrile, lactic acid wnl and without evidence of leukocytosis. XR right hand noted to have soft tissue swelling  without acute osseous abnormality identified.  Patient received IV cefazolin 2g in the ED. Patient was transitioned to IV vancomycin and ceftriaxone.  Orthopedic surgery was consulted and recommended an MRI of the right hand.  MRI showed no signs of osteomyelitis or tenosynovitis.  Patient's antibiotics were transitioned to Keflex at the time of discharge with recommendation for him to continue them for 10 days.  At the time of discharge, he had improvement in pain, redness, and swelling while remaining afebrile throughout his hospitalization.  All other issues chronic and stable.    Issues for Follow Up:  1.  Recommend evaluation within 1 week to ensure patient's hand continues to improve. 2.  Patient noted to have a hemoglobin of 21.7 on admission which may be patient's baseline, recommend outpatient follow-up.  Should have EPO and overnight sleep study as outpatient. 3.  Patient noted to have greatly elevated blood pressures often in the 009-381 systolic range during hospitalization.  Patient was started on amlodipine 10 mg daily as well as losartan 25 mg daily.  Recommend PCP follow-up for blood pressure check in 1 week. 4.  Patient was monitored for alcohol withdrawal due to history of drinking approximately 3 drinks per day, recommend PCP discussion on cessation. 5.  Patient 2 pack/day smoker, likely related to patient's elevated hemoglobin.  Recommend ongoing discussions of cessation. 6. UA with proteinuria to 100 on admission. Recommend repeat UA at f/u and further work up as necessary. 7.  Blood cultures were obtained on admission.  At the time of discharge, they showed no growth at 48 hours.  Please ensure follow up of these cultures. 8. Continue to encourage smoking cessation.  He was discharged with nicotine patch 21mg . 9. No ibuprofen or other NSAIDs. 10. Patient may benefit from hematology/oncology outpatient.  11. Patient discharged on Keflex 500 mg qid for 10 day course, ensure  completion of antibiotic therapy.   Significant Procedures:  none  Significant Labs and Imaging:  Recent Labs  Lab 03/18/20 1009 03/19/20 0356 03/20/20 0301  WBC 7.1 7.5 7.3  HGB 21.7* 20.2* 19.7*  HCT 64.2* 59.3* 56.4*  PLT PLATELET CLUMPS NOTED ON SMEAR, UNABLE TO ESTIMATE PLATELET CLUMPS NOTED ON SMEAR, UNABLE TO ESTIMATE PLATELET CLUMPS NOTED ON SMEAR, UNABLE TO ESTIMATE   Recent Labs  Lab 03/18/20 1009 03/18/20 1645 03/19/20 0356 03/20/20 0301  NA 136  --  135 135  K 4.2  --  3.6 3.8  CL 99  --  100 101  CO2 26  --  27 25  GLUCOSE 195*  --  117* 139*  BUN 13  --  12 9  CREATININE 0.78  --  0.67 0.63  CALCIUM 8.8*  --  8.4* 8.6*  MG  --  1.4* 1.7  --   PHOS  --  2.7  --   --   ALKPHOS 59  --  47 45  45  AST 16  --  14* 14*  15  ALT 17  --  13 12  11   ALBUMIN 3.0*  --  2.5* 2.6*  2.6*      Results/Tests Pending at Time of Discharge:  none  Discharge Medications:  Allergies as of 03/20/2020   No Known Allergies     Medication List    STOP taking these medications   fenofibrate 160 MG tablet   ibuprofen 200 MG tablet Commonly known as: ADVIL   lisinopril 40 MG tablet Commonly known as: ZESTRIL     TAKE these medications   amLODipine 10 MG tablet Commonly known as: NORVASC Take 1 tablet (10 mg total) by mouth daily. Start taking on: March 21, 2020 What changed:   medication strength  how much to take   aspirin EC 81 MG tablet Take 1 tablet (81 mg total) by mouth daily.   atorvastatin 40 MG tablet Commonly known as: LIPITOR Take 1 tablet (40 mg total) by mouth daily. For cholesterol   cephALEXin 500 MG capsule Commonly known as: KEFLEX Take 1 capsule (500 mg total) by mouth 4 (four) times daily for 10 days.   colchicine 0.6 MG tablet TAKE 2 TABLETS BY MOUTH AT THE ONSET OF A GOUT ATTACK, MAY REPEAT 1 TABLET IN 2 HOURS IF SYMPTOMS PERSIST. What changed:   how much to take  how to take this  when to take this  additional  instructions   losartan 25 MG tablet Commonly known as: COZAAR Take 1 tablet (25 mg total) by mouth daily.   metFORMIN 500 MG 24 hr tablet Commonly known as: GLUCOPHAGE-XR Take 1 tablet (500 mg total) by mouth daily with breakfast.   naproxen 500 MG tablet Commonly known as: Naprosyn Take 1 tablet (500 mg total) by mouth 2 (two) times daily with a meal.   nicotine 21 mg/24hr patch Commonly known as: NICODERM CQ - dosed in mg/24 hours Place 1 patch (21 mg total) onto the skin daily.       Discharge Instructions: Please refer to Patient Instructions section of EMR for full details.  Patient was counseled important signs and symptoms that should prompt return to medical care, changes in medications, dietary instructions, activity restrictions, and follow up appointments.  Follow-Up Appointments:  Follow-up Information    Charlott Rakes, MD. Schedule an appointment as soon as possible for a visit in 1 week(s).   Specialty: Family Medicine Why: Please make an appointment to be seen within the next week.  Contact information: [place] [address] [phone]        Roseanne Kaufman, MD Follow up.   Specialty: Orthopedic Surgery Contact information: [address] [place] [address] [phone]               Donney Dice, DO 03/20/2020, 1:48 PM PGY-1, [place]

## 2020-03-20 NOTE — TOC Transition Note (Signed)
Transition of Care [facility]) - CM/SW Discharge Note   Patient Details  Name: Donald Bender MRN: [id number] Date of Birth: Nov 24, 1960  Transition of Care [facility]) CM/SW Contact:  Maebelle Munroe, RN Phone Number: 03/20/2020, 3:57 PM   Clinical Narrative:  [facility] team for discharge planning. Spoke to patient who shares that he is in need of a PCP for post-discharge follow up. Provided resource for [facility]. Patient shares that he is aleady a patient there. Instructed him to call on Monday and explain that he needs a hospital discharge follow up. Patient verbalized understanding.    Final next level of care: Home/Self Care Barriers to Discharge: No Barriers Identified   Patient Goals and CMS Choice Patient states their goals for this hospitalization and ongoing recovery are:: I want to go home.      Discharge Placement                       Discharge Plan and Services                                     Social Determinants of Health (SDOH) Interventions     Readmission Risk Interventions No flowsheet data found.

## 2020-03-20 NOTE — Plan of Care (Signed)

## 2020-03-21 ENCOUNTER — Telehealth: Payer: Self-pay

## 2020-03-21 NOTE — Telephone Encounter (Signed)
Transition Care Management Follow-up Telephone Call  Date of discharge and from where: 03/20/2020, [facility]  How have you been since you were released from the hospital? He said he is doing better.   Any questions or concerns? No  Items Reviewed:  Did the pt receive and understand the discharge instructions provided? Yes   Medications obtained and verified? Yes  - he said he has all medications, including the new ones and did not have any questions about the med regime.  He said that the medications were given to him prior to leaving the hospital   Other? No   Any new allergies since your discharge? No   Do you have support at home? Yes   Home Care and Equipment/Supplies: Were home health services ordered? no If so, what is the name of the agency? n/a  Has the agency set up a time to come to the patient's home? not applicable Were any new equipment or medical supplies ordered?  No What is the name of the medical supply agency? n/a Were you able to get the supplies/equipment? not applicable Do you have any questions related to the use of the equipment or supplies? No   He said that the dressing on his right hand was removed prior to discharge.    Functional Questionnaire: (I = Independent and D = Dependent) ADLs: independent  Follow up appointments reviewed:   PCP Hospital f/u appt confirmed? Yes  - Dr Margarita Rana 03/30/2020.    [facility] f/u appt confirmed? No  He needs to call Dr Amedeo Plenty to schedule an appointment  Are transportation arrangements needed? No   If their condition worsens, is the pt aware to call PCP or go to the Emergency Dept.? Yes  Was the patient provided with contact information for the PCP's office or ED? Yes  Was to pt encouraged to call back with questions or concerns? Yes

## 2020-03-23 LAB — CULTURE, BLOOD (ROUTINE X 2)
Culture: NO GROWTH
Culture: NO GROWTH
Special Requests: ADEQUATE
Special Requests: ADEQUATE

## 2020-03-30 ENCOUNTER — Other Ambulatory Visit: Payer: Self-pay

## 2020-03-30 ENCOUNTER — Encounter: Payer: Self-pay | Admitting: Family Medicine

## 2020-03-30 ENCOUNTER — Ambulatory Visit: Payer: Self-pay | Attending: Family Medicine | Admitting: Family Medicine

## 2020-03-30 VITALS — BP 173/92 | HR 88 | Ht 75.0 in | Wt 247.0 lb

## 2020-03-30 DIAGNOSIS — E7849 Other hyperlipidemia: Secondary | ICD-10-CM

## 2020-03-30 DIAGNOSIS — E1159 Type 2 diabetes mellitus with other circulatory complications: Secondary | ICD-10-CM

## 2020-03-30 DIAGNOSIS — B351 Tinea unguium: Secondary | ICD-10-CM

## 2020-03-30 DIAGNOSIS — I152 Hypertension secondary to endocrine disorders: Secondary | ICD-10-CM

## 2020-03-30 DIAGNOSIS — D751 Secondary polycythemia: Secondary | ICD-10-CM

## 2020-03-30 DIAGNOSIS — E1169 Type 2 diabetes mellitus with other specified complication: Secondary | ICD-10-CM

## 2020-03-30 MED ORDER — TERBINAFINE HCL 250 MG PO TABS: 250.0000 mg | ORAL_TABLET | Freq: Every day | ORAL | 2 refills | Status: DC

## 2020-03-30 MED ORDER — ATORVASTATIN CALCIUM 40 MG PO TABS: 40.0000 mg | ORAL_TABLET | Freq: Every day | ORAL | 6 refills | Status: DC

## 2020-03-30 MED ORDER — LOSARTAN POTASSIUM 100 MG PO TABS: 100.0000 mg | ORAL_TABLET | Freq: Every day | ORAL | 6 refills | Status: DC

## 2020-03-30 MED ORDER — METFORMIN HCL ER 500 MG PO TB24: 500.0000 mg | ORAL_TABLET | Freq: Every day | ORAL | 6 refills | Status: DC

## 2020-03-30 MED ORDER — AMLODIPINE BESYLATE 10 MG PO TABS: 10.0000 mg | ORAL_TABLET | Freq: Every day | ORAL | 6 refills | Status: DC

## 2020-03-30 NOTE — Patient Instructions (Signed)

## 2020-03-30 NOTE — Progress Notes (Signed)
Subjective:  Patient ID: Donald Bender, male    DOB: 1960/03/20  Age: 60 y.o. MRN: [id number]  CC: Hospitalization Follow-up   HPI Donald Bender is a 60 year old male with a history of type 2 diabetes mellitus (A1c 7.2), hypertension, tobacco abuse here for transitional care follow-up after hospitalization at [facility] from 03/18/2020 through 03/20/2020 for cellulitis of the right upper extremity. He was last seen in the clinic in 2018. For his recent hospitalization he had presented with right hand pain which had progressively worsened to include his forearm.  Treated with IV antibiotics.  MRI was negative for osteomyelitis or tenosynovitis.  Labs had revealed polycythemia with hemoglobin of 19.7. His chronic medical conditions were also managed.  IV antibiotics transition to oral Keflex at discharge.  His R hand is better and swelling has resolved.  He reports feeling good. Compliant with his Metformin for diabetes and with his antihypertensives but his blood pressure is still elevated. He has no neuropathy, visual symptoms, hypoglycemic symptoms.  Denies presence of chest pain.  Continues to smoke. He has had yellowish discoloration of his bilateral big toenails which he attributes to fungal infection and this has been going on for years.  He has no pain in his toes.  Past Medical History:  Diagnosis Date  . Diabetes mellitus without complication ([place])   . Hypertension     History reviewed. No pertinent surgical history.  Family History  Problem Relation Age of Onset  . Leukemia Mother 30    No Known Allergies  Outpatient Medications Prior to Visit  Medication Sig Dispense Refill  . aspirin EC 81 MG tablet Take 1 tablet (81 mg total) by mouth daily. 30 tablet 5  . colchicine 0.6 MG tablet TAKE 2 TABLETS BY MOUTH AT THE ONSET OF A GOUT ATTACK, MAY REPEAT 1 TABLET IN 2 HOURS IF SYMPTOMS PERSIST. (Patient taking differently: Take 0.6-1.2 mg by mouth See admin  instructions. Take 2 tablets at the onset of a gout attack, may repeat 1 tablet in 1 to 2 hours if symptoms persist.) 90 tablet 3  . naproxen (NAPROSYN) 500 MG tablet Take 1 tablet (500 mg total) by mouth 2 (two) times daily with a meal. 60 tablet 0  . nicotine (NICODERM CQ - DOSED IN MG/24 HOURS) 21 mg/24hr patch Place 1 patch (21 mg total) onto the skin daily. 28 patch 0  . amLODipine (NORVASC) 10 MG tablet Take 1 tablet (10 mg total) by mouth daily. 30 tablet 0  . atorvastatin (LIPITOR) 40 MG tablet Take 1 tablet (40 mg total) by mouth daily. For cholesterol 30 tablet 1  . losartan (COZAAR) 25 MG tablet Take 1 tablet (25 mg total) by mouth daily. 30 tablet 0  . metFORMIN (GLUCOPHAGE-XR) 500 MG 24 hr tablet Take 1 tablet (500 mg total) by mouth daily with breakfast. 30 tablet 1  . cephALEXin (KEFLEX) 500 MG capsule Take 1 capsule (500 mg total) by mouth 4 (four) times daily for 10 days. (Patient not taking: Reported on 03/30/2020) 40 capsule 0   No facility-administered medications prior to visit.     ROS Review of Systems  Constitutional: Negative for activity change and appetite change.  HENT: Negative for sinus pressure and sore throat.   Eyes: Negative for visual disturbance.  Respiratory: Negative for cough, chest tightness and shortness of breath.   Cardiovascular: Negative for chest pain and leg swelling.  Gastrointestinal: Negative for abdominal distention, abdominal pain, constipation and diarrhea.  Endocrine: Negative.  Genitourinary: Negative for dysuria.  Musculoskeletal: Negative for joint swelling and myalgias.  Skin: Negative for rash.  Allergic/Immunologic: Negative.   Neurological: Negative for weakness, light-headedness and numbness.  Psychiatric/Behavioral: Negative for dysphoric mood and suicidal ideas.    Objective:  BP (!) 173/92   Pulse 88   Ht 6\' 3"  (1.905 m)   Wt 247 lb (112 kg)   SpO2 93%   BMI 30.87 kg/m   BP/Weight 03/30/2020 03/20/2020 86/76/7209   Systolic BP 470 962 836  Diastolic BP 92 629 91  Wt. (Lbs) 247 260 271  BMI 30.87 32.5 33.87      Physical Exam Constitutional:      Appearance: He is well-developed.  Neck:     Vascular: No JVD.  Cardiovascular:     Rate and Rhythm: Normal rate.     Heart sounds: Normal heart sounds. No murmur heard.   Pulmonary:     Effort: Pulmonary effort is normal.     Breath sounds: Normal breath sounds. No wheezing or rales.  Chest:     Chest wall: No tenderness.  Abdominal:     General: Bowel sounds are normal. There is no distension.     Palpations: Abdomen is soft. There is no mass.     Tenderness: There is no abdominal tenderness.  Musculoskeletal:        General: Normal range of motion.     Right lower leg: No edema.     Left lower leg: No edema.  Neurological:     Mental Status: He is alert and oriented to person, place, and time.  Psychiatric:        Mood and Affect: Mood normal.    Diabetic Foot Exam - Simple   Simple Foot Form Diabetic Foot exam was performed with the following findings: Yes 03/30/2020 12:04 PM  Visual Inspection See comments: Yes Sensation Testing Intact to touch and monofilament testing bilaterally: Yes Pulse Check Posterior Tibialis and Dorsalis pulse intact bilaterally: Yes Comments Onychomycosis of big toenails bilaterally.  No ulcerations or skin breakdown.  Callus on medial aspect of left foot      CMP Latest Ref Rng & Units 03/20/2020 03/20/2020 03/19/2020  Glucose 70 - 99 mg/dL - 139(H) 117(H)  BUN 6 - 20 mg/dL - 9 12  Creatinine 0.61 - 1.24 mg/dL - 0.63 0.67  Sodium 135 - 145 mmol/L - 135 135  Potassium 3.5 - 5.1 mmol/L - 3.8 3.6  Chloride 98 - 111 mmol/L - 101 100  CO2 22 - 32 mmol/L - 25 27  Calcium 8.9 - 10.3 mg/dL - 8.6(L) 8.4(L)  Total Protein 6.5 - 8.1 g/dL 6.0(L) 5.8(L) 5.9(L)  Total Bilirubin 0.3 - 1.2 mg/dL 1.3(H) 1.4(H) 1.6(H)  Alkaline Phos 38 - 126 U/L 45 45 47  AST 15 - 41 U/L 14(L) 15 14(L)  ALT 0 - 44 U/L 12 11 13      Lipid Panel     Component Value Date/Time   CHOL 189 03/18/2020 1645   CHOL 215 (H) 01/24/2017 1512   TRIG 152 (H) 03/18/2020 1645   HDL 47 03/18/2020 1645   HDL 53 01/24/2017 1512   CHOLHDL 4.0 03/18/2020 1645   VLDL 30 03/18/2020 1645   LDLCALC 112 (H) 03/18/2020 1645   LDLCALC Comment 01/24/2017 1512    CBC    Component Value Date/Time   WBC 7.3 03/20/2020 0301   RBC 5.70 03/20/2020 0301   HGB 19.7 (H) 03/20/2020 0301   HCT 56.4 (H) 03/20/2020 0301  PLT PLATELET CLUMPS NOTED ON SMEAR, UNABLE TO ESTIMATE 03/20/2020 0301   MCV 98.9 03/20/2020 0301   MCH 34.6 (H) 03/20/2020 0301   MCHC 34.9 03/20/2020 0301   RDW 12.6 03/20/2020 0301   LYMPHSABS 1.8 03/18/2020 1009   MONOABS 0.5 03/18/2020 1009   EOSABS 0.3 03/18/2020 1009   BASOSABS 0.1 03/18/2020 1009    Lab Results  Component Value Date   HGBA1C 7.2 (H) 03/18/2020    Assessment & Plan:  1. Polycythemia Likely related to smoking We will refer to hematology to exclude underlying causes - Ambulatory referral to Hematology  2. Other hyperlipidemia Uncontrolled Compliance with statin uncertain Low-cholesterol diet - atorvastatin (LIPITOR) 40 MG tablet; Take 1 tablet (40 mg total) by mouth daily. For cholesterol  Dispense: 30 tablet; Refill: 6  3. Type 2 diabetes mellitus with other specified complication, without long-term current use of insulin (HCC) Controlled with A1c of 7.2 Continue current management Counseled on Diabetic diet, my plate method, 878 minutes of moderate intensity exercise/week Blood sugar logs with fasting goals of 80-120 mg/dl, random of less than 180 and in the event of sugars less than 60 mg/dl or greater than 400 mg/dl encouraged to notify the clinic. Advised on the need for annual eye exams, annual foot exams, Pneumonia vaccine. - metFORMIN (GLUCOPHAGE-XR) 500 MG 24 hr tablet; Take 1 tablet (500 mg total) by mouth daily with breakfast.  Dispense: 30 tablet; Refill: 6 - Basic  Metabolic Panel; Future  4. Hypertension associated with diabetes ([place]) Uncontrolled Losartan dose increased We will have him follow-up with the clinical pharmacist at his next visit and if blood pressure is above goal consider substituting losartan with losartan/HCTZ Basic metabolic panel in 1 week to check potassium - losartan (COZAAR) 100 MG tablet; Take 1 tablet (100 mg total) by mouth daily.  Dispense: 30 tablet; Refill: 6 - amLODipine (NORVASC) 10 MG tablet; Take 1 tablet (10 mg total) by mouth daily.  Dispense: 30 tablet; Refill: 6  5. Onychomycosis Counseled on pathophysiology of onychomycosis and treatment options including further he may need to see podiatry - terbinafine (LAMISIL) 250 MG tablet; Take 1 tablet (250 mg total) by mouth daily.  Dispense: 30 tablet; Refill: 2   Meds ordered this encounter  Medications  . losartan (COZAAR) 100 MG tablet    Sig: Take 1 tablet (100 mg total) by mouth daily.    Dispense:  30 tablet    Refill:  6  . amLODipine (NORVASC) 10 MG tablet    Sig: Take 1 tablet (10 mg total) by mouth daily.    Dispense:  30 tablet    Refill:  6  . atorvastatin (LIPITOR) 40 MG tablet    Sig: Take 1 tablet (40 mg total) by mouth daily. For cholesterol    Dispense:  30 tablet    Refill:  6  . metFORMIN (GLUCOPHAGE-XR) 500 MG 24 hr tablet    Sig: Take 1 tablet (500 mg total) by mouth daily with breakfast.    Dispense:  30 tablet    Refill:  6  . terbinafine (LAMISIL) 250 MG tablet    Sig: Take 1 tablet (250 mg total) by mouth daily.    Dispense:  30 tablet    Refill:  2    Follow-up: Return in about 1 week (around 04/06/2020) for For BP evaluation with Lurena Joiner; 3 months with PCP-medical conditions.       Charlott Rakes, MD, FAAFP. [facility] and [facility] [place], [place]  03/30/2020, 12:05 PM

## 2020-04-08 ENCOUNTER — Telehealth: Payer: Self-pay | Admitting: Hematology

## 2020-04-08 NOTE — Telephone Encounter (Signed)
Received a new hem referral from Dr. Margarita Rana for polycythemia. Pt has been cld and scheduled to see Dr. Irene Limbo on 3/23 at 1pm. Pt aware to arrive 20 minutes early.

## 2020-04-11 ENCOUNTER — Encounter: Payer: Self-pay | Admitting: Pharmacist

## 2020-04-11 ENCOUNTER — Ambulatory Visit: Payer: Self-pay | Attending: Family Medicine | Admitting: Pharmacist

## 2020-04-11 ENCOUNTER — Other Ambulatory Visit: Payer: Self-pay

## 2020-04-11 VITALS — BP 169/91

## 2020-04-11 DIAGNOSIS — E1159 Type 2 diabetes mellitus with other circulatory complications: Secondary | ICD-10-CM

## 2020-04-11 DIAGNOSIS — I152 Hypertension secondary to endocrine disorders: Secondary | ICD-10-CM

## 2020-04-11 MED ORDER — LOSARTAN POTASSIUM-HCTZ 100-25 MG PO TABS: 1.0000 | ORAL_TABLET | Freq: Every day | ORAL | 3 refills | Status: DC

## 2020-04-11 NOTE — Progress Notes (Signed)
   S: PCP: Dr. Margarita Rana     Patient arrives in good spirits. Presents to the clinic for hypertension evaluation, counseling, and management.  Patient was referred and last seen by Primary Care Provider on 03/30/2020. BP was above goal at that appointment so Dr. Margarita Rana increased losartan to 100 mg daily.    Patient denies chest pain, dyspnea, HA or blurred vision. No dizziness or lightheadedness.   Medication adherence reported. He has taken losartan today.   Current BP Medications include:  amlodipine 10 mg daily, Losartan 100 mg daily   Dietary habits include: admits to some dietary indiscretion in regard to sodium intake; denies drinking excess caffeine  Exercise habits include: none  Family / Social history:  - FHx: no pertinent positives  - Tobacco: current 1 PPD smoker  - Alcohol: 2-3x weekly (weekly 6 standard beverages)   O:  Vitals:   04/11/20 1518  BP: (!) 169/91   Home BP readings: none   Last 3 Office BP readings: BP Readings from Last 3 Encounters:  04/11/20 (!) 169/91  03/30/20 (!) 173/92  03/20/20 (!) 196/102    BMET    Component Value Date/Time   NA 135 03/20/2020 0301   NA 142 01/24/2017 1512   K 3.8 03/20/2020 0301   CL 101 03/20/2020 0301   CO2 25 03/20/2020 0301   GLUCOSE 139 (H) 03/20/2020 0301   BUN 9 03/20/2020 0301   BUN 13 01/24/2017 1512   CREATININE 0.63 03/20/2020 0301   CREATININE 0.64 (L) 11/21/2015 0915   CALCIUM 8.6 (L) 03/20/2020 0301   GFRNONAA >60 03/20/2020 0301   GFRNONAA >89 11/21/2015 0915   GFRAA 136 01/24/2017 1512   GFRAA >89 11/21/2015 0915    Renal function: CrCl cannot be calculated (Patient's most recent lab result is older than the maximum 21 days allowed.).  Clinical ASCVD: No  The 10-year ASCVD risk score Mikey Bussing DC Jr., et al., 2013) is: 40.2%   Values used to calculate the score:     Age: 60 years     Sex: Male     Is Non-Hispanic African American: No     Diabetic: Yes     Tobacco smoker: Yes     Systolic  Blood Pressure: 169 mmHg     Is BP treated: Yes     HDL Cholesterol: 47 mg/dL     Total Cholesterol: 189 mg/dL  A/P: Hypertension longstanding currently uncontrolled on current medications. BP Goal = < 130/80 mmHg. Medication adherence reported. Dr. Margarita Rana recommends to replace losartan with combination Hyzaar d/t above goal BP today. Of note, pt has a hx of gout listed on his profile. Thiazides carry a risk of exacerbating gout but pt assures me he has not experienced any flares recently. Will have him stop losartan and start Hyzaar. He will return in 1 month for reassessment. -Discontinued losartan.. -Start losartan-HCTZ 100-25 mg daily. -Continue amlodipine daily.   -Counseled on lifestyle modifications for blood pressure control including reduced dietary sodium, increased exercise, adequate sleep.  Results reviewed and written information provided.   Total time in face-to-face counseling 15 minutes.   F/U Clinic Visit in 1 month.    Benard Halsted, PharmD, [facility], [place] [phone]

## 2020-04-19 NOTE — Progress Notes (Incomplete)
HEMATOLOGY/ONCOLOGY CONSULTATION NOTE  Date of Service: 04/19/2020  Patient Care Team: Charlott Rakes, MD as PCP - General (Family Medicine)  CHIEF COMPLAINTS/PURPOSE OF CONSULTATION:  Polycythemia  HISTORY OF PRESENTING ILLNESS:  Donald Bender is a wonderful 60 y.o. male who has been referred to [place] by Dr. Charlott Rakes, MD for evaluation and management of polycythemia. The pt reports that he is doing well overall.  The pt reports ***  Lab results *** of CBC w/diff and CMP is as follows: all values are WNL except for ***  On review of systems, pt reports *** and denies *** and any other symptoms.   MEDICAL HISTORY:  Past Medical History:  Diagnosis Date  . Diabetes mellitus without complication ([place])   . Hypertension     SURGICAL HISTORY: No past surgical history on file.  SOCIAL HISTORY: Social History   Socioeconomic History  . Marital status: Married    Spouse name: Not on file  . Number of children: Not on file  . Years of education: Not on file  . Highest education level: Not on file  Occupational History  . Not on file  Tobacco Use  . Smoking status: Current Every Day Smoker    Packs/day: 1.00  . Smokeless tobacco: Never Used  . Tobacco comment: Smoking 1 ppd  Substance and Sexual Activity  . Alcohol use: Yes    Alcohol/week: 6.0 standard drinks    Types: 6 Cans of beer per week    Comment: 2-3 times weekly  . Drug use: No  . Sexual activity: Not on file  Other Topics Concern  . Not on file  Social History Narrative  . Not on file   Social Determinants of Health   Financial Resource Strain: Not on file  Food Insecurity: Not on file  Transportation Needs: Not on file  Physical Activity: Not on file  Stress: Not on file  Social Connections: Not on file  Intimate Partner Violence: Not on file    FAMILY HISTORY: Family History  Problem Relation Age of Onset  . Leukemia Mother 5    ALLERGIES:  has No Known Allergies.  MEDICATIONS:   Current Outpatient Medications  Medication Sig Dispense Refill  . amLODipine (NORVASC) 10 MG tablet Take 1 tablet (10 mg total) by mouth daily. 30 tablet 6  . aspirin EC 81 MG tablet Take 1 tablet (81 mg total) by mouth daily. 30 tablet 5  . atorvastatin (LIPITOR) 40 MG tablet Take 1 tablet (40 mg total) by mouth daily. For cholesterol 30 tablet 6  . colchicine 0.6 MG tablet TAKE 2 TABLETS BY MOUTH AT THE ONSET OF A GOUT ATTACK, MAY REPEAT 1 TABLET IN 2 HOURS IF SYMPTOMS PERSIST. (Patient taking differently: Take 0.6-1.2 mg by mouth See admin instructions. Take 2 tablets at the onset of a gout attack, may repeat 1 tablet in 1 to 2 hours if symptoms persist.) 90 tablet 3  . losartan-hydrochlorothiazide (HYZAAR) 100-25 MG tablet Take 1 tablet by mouth daily. 90 tablet 3  . metFORMIN (GLUCOPHAGE-XR) 500 MG 24 hr tablet Take 1 tablet (500 mg total) by mouth daily with breakfast. 30 tablet 6  . naproxen (NAPROSYN) 500 MG tablet Take 1 tablet (500 mg total) by mouth 2 (two) times daily with a meal. 60 tablet 0  . nicotine (NICODERM CQ - DOSED IN MG/24 HOURS) 21 mg/24hr patch Place 1 patch (21 mg total) onto the skin daily. 28 patch 0  . terbinafine (LAMISIL) 250 MG tablet  Take 1 tablet (250 mg total) by mouth daily. 30 tablet 2   No current facility-administered medications for this visit.    REVIEW OF SYSTEMS:   10 Point review of Systems was done is negative except as noted above.  PHYSICAL EXAMINATION: ECOG PERFORMANCE STATUS: {CHL ONC ECOG KG:[id number]}  .There were no vitals filed for this visit. There were no vitals filed for this visit. .There is no height or weight on file to calculate BMI.  *** GENERAL:alert, in no acute distress and comfortable SKIN: no acute rashes, no significant lesions EYES: conjunctiva are pink and non-injected, sclera anicteric OROPHARYNX: MMM, no exudates, no oropharyngeal erythema or ulceration NECK: supple, no JVD LYMPH:  no palpable lymphadenopathy in  the cervical, axillary or inguinal regions LUNGS: clear to auscultation b/l with normal respiratory effort HEART: regular rate & rhythm ABDOMEN:  normoactive bowel sounds , non tender, not distended. Extremity: no pedal edema PSYCH: alert & oriented x 3 with fluent speech NEURO: no focal motor/sensory deficits  LABORATORY DATA:  I have reviewed the data as listed  . CBC Latest Ref Rng & Units 03/20/2020 03/19/2020 03/18/2020  WBC 4.0 - 10.5 K/uL 7.3 7.5 7.1  Hemoglobin 13.0 - 17.0 g/dL 19.7(H) 20.2(H) 21.7(HH)  Hematocrit 39.0 - 52.0 % 56.4(H) 59.3(H) 64.2(H)  Platelets 150 - 400 K/uL PLATELET CLUMPS NOTED ON SMEAR, UNABLE TO ESTIMATE PLATELET CLUMPS NOTED ON SMEAR, UNABLE TO ESTIMATE PLATELET CLUMPS NOTED ON SMEAR, UNABLE TO ESTIMATE    . CMP Latest Ref Rng & Units 03/20/2020 03/20/2020 03/19/2020  Glucose 70 - 99 mg/dL - 139(H) 117(H)  BUN 6 - 20 mg/dL - 9 12  Creatinine 0.61 - 1.24 mg/dL - 0.63 0.67  Sodium 135 - 145 mmol/L - 135 135  Potassium 3.5 - 5.1 mmol/L - 3.8 3.6  Chloride 98 - 111 mmol/L - 101 100  CO2 22 - 32 mmol/L - 25 27  Calcium 8.9 - 10.3 mg/dL - 8.6(L) 8.4(L)  Total Protein 6.5 - 8.1 g/dL 6.0(L) 5.8(L) 5.9(L)  Total Bilirubin 0.3 - 1.2 mg/dL 1.3(H) 1.4(H) 1.6(H)  Alkaline Phos 38 - 126 U/L 45 45 47  AST 15 - 41 U/L 14(L) 15 14(L)  ALT 0 - 44 U/L 12 11 13      RADIOGRAPHIC STUDIES: I have personally reviewed the radiological images as listed and agreed with the findings in the report. No results found.  ASSESSMENT & PLAN:    PLAN: -***   -Will see back ***    FOLLOW UP: ***    All of the patients questions were answered with apparent satisfaction. The patient knows to call the clinic with any problems, questions or concerns.  I spent {CHL ONC TIME VISIT - SEGBT:[id number]} counseling the patient face to face. The total time spent in the appointment was {CHL ONC TIME VISIT - TGGYI:[id number]} and more than 50% was on counseling and direct patient  cares.    Sullivan Lone MD [place] AAHIVMS [facility] Hematology/Oncology Physician [facility]  (Office):       [phone] (Work cell):  [phone] (Fax):           [phone]  04/19/2020 9:05 PM   I, Reinaldo Raddle, am acting as scribe for Dr. Sullivan Lone, MD.

## 2020-04-20 ENCOUNTER — Telehealth: Payer: Self-pay | Admitting: Hematology

## 2020-04-20 ENCOUNTER — Inpatient Hospital Stay: Payer: Self-pay | Admitting: Hematology

## 2020-04-20 NOTE — Telephone Encounter (Signed)
Pt cld to reschedule his new hem appt w/Dr. Irene Limbo to 4/7 at 11am.

## 2020-05-04 NOTE — Progress Notes (Incomplete)
HEMATOLOGY/ONCOLOGY CONSULTATION NOTE  Date of Service: 05/04/2020  Patient Care Team: Charlott Rakes, MD as PCP - General (Family Medicine)  CHIEF COMPLAINTS/PURPOSE OF CONSULTATION:  Polycythemia  HISTORY OF PRESENTING ILLNESS:  Donald Bender is a wonderful 60 y.o. male who has been referred to [place] by Dr. Charlott Rakes, MD for evaluation and management of polycythemia.The pt reports that he is doing well overall.  The pt reports ***  Lab results 03/20/2020 of CBC w/diff and CMP is as follows: all values are WNL except for Hgb of 19.7, HCT of 56.4, MCH of 34.6.  On review of systems, pt reports *** and denies *** and any other symptoms.   MEDICAL HISTORY:  Past Medical History:  Diagnosis Date  . Diabetes mellitus without complication ([place])   . Hypertension     SURGICAL HISTORY: No past surgical history on file.  SOCIAL HISTORY: Social History   Socioeconomic History  . Marital status: Married    Spouse name: Not on file  . Number of children: Not on file  . Years of education: Not on file  . Highest education level: Not on file  Occupational History  . Not on file  Tobacco Use  . Smoking status: Current Every Day Smoker    Packs/day: 1.00  . Smokeless tobacco: Never Used  . Tobacco comment: Smoking 1 ppd  Substance and Sexual Activity  . Alcohol use: Yes    Alcohol/week: 6.0 standard drinks    Types: 6 Cans of beer per week    Comment: 2-3 times weekly  . Drug use: No  . Sexual activity: Not on file  Other Topics Concern  . Not on file  Social History Narrative  . Not on file   Social Determinants of Health   Financial Resource Strain: Not on file  Food Insecurity: Not on file  Transportation Needs: Not on file  Physical Activity: Not on file  Stress: Not on file  Social Connections: Not on file  Intimate Partner Violence: Not on file    FAMILY HISTORY: Family History  Problem Relation Age of Onset  . Leukemia Mother 76    ALLERGIES:   has No Known Allergies.  MEDICATIONS:  Current Outpatient Medications  Medication Sig Dispense Refill  . amLODipine (NORVASC) 10 MG tablet Take 1 tablet (10 mg total) by mouth daily. 30 tablet 6  . aspirin EC 81 MG tablet Take 1 tablet (81 mg total) by mouth daily. 30 tablet 5  . atorvastatin (LIPITOR) 40 MG tablet Take 1 tablet (40 mg total) by mouth daily. For cholesterol 30 tablet 6  . colchicine 0.6 MG tablet TAKE 2 TABLETS BY MOUTH AT THE ONSET OF A GOUT ATTACK, MAY REPEAT 1 TABLET IN 2 HOURS IF SYMPTOMS PERSIST. (Patient taking differently: Take 0.6-1.2 mg by mouth See admin instructions. Take 2 tablets at the onset of a gout attack, may repeat 1 tablet in 1 to 2 hours if symptoms persist.) 90 tablet 3  . losartan-hydrochlorothiazide (HYZAAR) 100-25 MG tablet Take 1 tablet by mouth daily. 90 tablet 3  . metFORMIN (GLUCOPHAGE-XR) 500 MG 24 hr tablet Take 1 tablet (500 mg total) by mouth daily with breakfast. 30 tablet 6  . nicotine (NICODERM CQ - DOSED IN MG/24 HOURS) 21 mg/24hr patch Place 1 patch (21 mg total) onto the skin daily. 28 patch 0  . terbinafine (LAMISIL) 250 MG tablet Take 1 tablet (250 mg total) by mouth daily. 30 tablet 2   No current facility-administered medications  for this visit.    REVIEW OF SYSTEMS:   10 Point review of Systems was done is negative except as noted above.  PHYSICAL EXAMINATION: ECOG PERFORMANCE STATUS: {CHL ONC ECOG FT:[id number]}  .There were no vitals filed for this visit. There were no vitals filed for this visit. .There is no height or weight on file to calculate BMI.  *** GENERAL:alert, in no acute distress and comfortable SKIN: no acute rashes, no significant lesions EYES: conjunctiva are pink and non-injected, sclera anicteric OROPHARYNX: MMM, no exudates, no oropharyngeal erythema or ulceration NECK: supple, no JVD LYMPH:  no palpable lymphadenopathy in the cervical, axillary or inguinal regions LUNGS: clear to auscultation b/l with  normal respiratory effort HEART: regular rate & rhythm ABDOMEN:  normoactive bowel sounds , non tender, not distended. Extremity: no pedal edema PSYCH: alert & oriented x 3 with fluent speech NEURO: no focal motor/sensory deficits  LABORATORY DATA:  I have reviewed the data as listed  . CBC Latest Ref Rng & Units 03/20/2020 03/19/2020 03/18/2020  WBC 4.0 - 10.5 K/uL 7.3 7.5 7.1  Hemoglobin 13.0 - 17.0 g/dL 19.7(H) 20.2(H) 21.7(HH)  Hematocrit 39.0 - 52.0 % 56.4(H) 59.3(H) 64.2(H)  Platelets 150 - 400 K/uL PLATELET CLUMPS NOTED ON SMEAR, UNABLE TO ESTIMATE PLATELET CLUMPS NOTED ON SMEAR, UNABLE TO ESTIMATE PLATELET CLUMPS NOTED ON SMEAR, UNABLE TO ESTIMATE    . CMP Latest Ref Rng & Units 03/20/2020 03/20/2020 03/19/2020  Glucose 70 - 99 mg/dL - 139(H) 117(H)  BUN 6 - 20 mg/dL - 9 12  Creatinine 0.61 - 1.24 mg/dL - 0.63 0.67  Sodium 135 - 145 mmol/L - 135 135  Potassium 3.5 - 5.1 mmol/L - 3.8 3.6  Chloride 98 - 111 mmol/L - 101 100  CO2 22 - 32 mmol/L - 25 27  Calcium 8.9 - 10.3 mg/dL - 8.6(L) 8.4(L)  Total Protein 6.5 - 8.1 g/dL 6.0(L) 5.8(L) 5.9(L)  Total Bilirubin 0.3 - 1.2 mg/dL 1.3(H) 1.4(H) 1.6(H)  Alkaline Phos 38 - 126 U/L 45 45 47  AST 15 - 41 U/L 14(L) 15 14(L)  ALT 0 - 44 U/L 12 11 13      RADIOGRAPHIC STUDIES: I have personally reviewed the radiological images as listed and agreed with the findings in the report. No results found.  ASSESSMENT & PLAN:    PLAN: -***   -Will see back ***    FOLLOW UP: ***   All of the patients questions were answered with apparent satisfaction. The patient knows to call the clinic with any problems, questions or concerns.  I spent {CHL ONC TIME VISIT - CWCBJ:[id number]} counseling the patient face to face. The total time spent in the appointment was {CHL ONC TIME VISIT - YWVPX:[id number]} and more than 50% was on counseling and direct patient cares.    Sullivan Lone MD [place] AAHIVMS [facility] Hematology/Oncology Physician [facility]  (Office):       [phone] (Work cell):  [phone] (Fax):           [phone]  05/04/2020 9:36 PM  I, Reinaldo Raddle, am acting as scribe for Dr. Sullivan Lone, MD.

## 2020-05-05 ENCOUNTER — Inpatient Hospital Stay: Payer: Self-pay | Attending: Hematology | Admitting: Hematology

## 2020-05-12 ENCOUNTER — Ambulatory Visit: Payer: Self-pay | Attending: Family Medicine | Admitting: Pharmacist

## 2020-05-12 ENCOUNTER — Encounter: Payer: Self-pay | Admitting: Pharmacist

## 2020-05-12 ENCOUNTER — Other Ambulatory Visit: Payer: Self-pay

## 2020-05-12 VITALS — BP 151/95

## 2020-05-12 DIAGNOSIS — E1159 Type 2 diabetes mellitus with other circulatory complications: Secondary | ICD-10-CM

## 2020-05-12 DIAGNOSIS — E1169 Type 2 diabetes mellitus with other specified complication: Secondary | ICD-10-CM

## 2020-05-12 DIAGNOSIS — I152 Hypertension secondary to endocrine disorders: Secondary | ICD-10-CM

## 2020-05-12 NOTE — Progress Notes (Signed)
   S: PCP: Dr. Margarita Rana     Patient arrives in good spirits. Presents to the clinic for hypertension evaluation, counseling, and management.  Patient was referred and last seen by Primary Care Provider on 03/30/2020. BP was above goal at that appointment so Dr. Margarita Rana increased losartan to 100 mg daily. We saw him 04/11/2020 and changed his losartan to combination Hyzaar.   Patient denies chest pain, dyspnea, HA or blurred vision. No dizziness or lightheadedness.   Medication adherence reported. Of note, he reports feeling "jittery" since starting Hyzaar. Denies other side effects. Willing to continue medication.  Current BP Medications include:  amlodipine 10 mg daily, Losartan-HCTZ 100-25mg  daily   Dietary habits include: admits to some dietary indiscretion in regard to sodium intake; denies drinking excess caffeine  Exercise habits include: none  Family / Social history:  - FHx: no pertinent positives  - Tobacco: current 1 PPD smoker  - Alcohol: 2-3x weekly (weekly 6 standard beverages)   O:  Vitals:   05/12/20 1048  BP: (!) 151/95   Home BP readings: none   Last 3 Office BP readings: BP Readings from Last 3 Encounters:  05/12/20 (!) 151/95  04/11/20 (!) 169/91  03/30/20 (!) 173/92    BMET    Component Value Date/Time   NA 135 03/20/2020 0301   NA 142 01/24/2017 1512   K 3.8 03/20/2020 0301   CL 101 03/20/2020 0301   CO2 25 03/20/2020 0301   GLUCOSE 139 (H) 03/20/2020 0301   BUN 9 03/20/2020 0301   BUN 13 01/24/2017 1512   CREATININE 0.63 03/20/2020 0301   CREATININE 0.64 (L) 11/21/2015 0915   CALCIUM 8.6 (L) 03/20/2020 0301   GFRNONAA >60 03/20/2020 0301   GFRNONAA >89 11/21/2015 0915   GFRAA 136 01/24/2017 1512   GFRAA >89 11/21/2015 0915    Renal function: CrCl cannot be calculated (Patient's most recent lab result is older than the maximum 21 days allowed.).  Clinical ASCVD: No  The 10-year ASCVD risk score Mikey Bussing DC Jr., et al., 2013) is: 34.3%   Values  used to calculate the score:     Age: 60 years     Sex: Male     Is Non-Hispanic African American: No     Diabetic: Yes     Tobacco smoker: Yes     Systolic Blood Pressure: 623 mmHg     Is BP treated: Yes     HDL Cholesterol: 47 mg/dL     Total Cholesterol: 189 mg/dL  A/P: Hypertension longstanding currently uncontrolled on current medications but improving. BP Goal = < 130/80 mmHg. Medication adherence reported. Will get labs today and have him return in 2 weeks for reassessment. Options in the future include spironolactone or carvedilol. -Continue losartan-HCTZ 100-25 mg daily. -Continue amlodipine 10mg  daily.   -BMP -Counseled on lifestyle modifications for blood pressure control including reduced dietary sodium, increased exercise, adequate sleep.  Results reviewed and written information provided.   Total time in face-to-face counseling 15 minutes.   F/U Clinic Visit in 2 weeks.    Benard Halsted, PharmD, [facility], [place] [phone]

## 2020-05-13 LAB — BASIC METABOLIC PANEL
BUN/Creatinine Ratio: 32 — ABNORMAL HIGH (ref 9–20)
BUN: 31 mg/dL — ABNORMAL HIGH (ref 6–24)
CO2: 23 mmol/L (ref 20–29)
Calcium: 9.9 mg/dL (ref 8.7–10.2)
Chloride: 98 mmol/L (ref 96–106)
Creatinine, Ser: 0.98 mg/dL (ref 0.76–1.27)
Glucose: 164 mg/dL — ABNORMAL HIGH (ref 65–99)
Potassium: 4.3 mmol/L (ref 3.5–5.2)
Sodium: 139 mmol/L (ref 134–144)
eGFR: 89 mL/min/{1.73_m2} (ref >59)

## 2020-05-16 ENCOUNTER — Telehealth: Payer: Self-pay

## 2020-05-16 NOTE — Telephone Encounter (Signed)
Pt was called and there is no VM set up to leave a message. 

## 2020-05-16 NOTE — Telephone Encounter (Signed)
-----   Message from Charlott Rakes, MD sent at 05/16/2020  4:01 PM EDT ----- Labs are stable

## 2020-05-20 ENCOUNTER — Telehealth: Payer: Self-pay

## 2020-05-20 NOTE — Telephone Encounter (Signed)
-----   Message from Enobong Newlin, MD sent at 05/16/2020  4:01 PM EDT ----- Labs are stable 

## 2020-05-20 NOTE — Telephone Encounter (Signed)
No answer no vm set up.  Patient will be mailed a letter informing him of his lab results.

## 2020-05-26 ENCOUNTER — Encounter: Payer: Self-pay | Admitting: Pharmacist

## 2020-05-26 ENCOUNTER — Other Ambulatory Visit: Payer: Self-pay

## 2020-05-26 ENCOUNTER — Ambulatory Visit: Payer: Self-pay | Attending: Family Medicine | Admitting: Pharmacist

## 2020-05-26 VITALS — BP 151/93

## 2020-05-26 DIAGNOSIS — E1159 Type 2 diabetes mellitus with other circulatory complications: Secondary | ICD-10-CM

## 2020-05-26 DIAGNOSIS — I152 Hypertension secondary to endocrine disorders: Secondary | ICD-10-CM

## 2020-05-26 MED ORDER — SPIRONOLACTONE 25 MG PO TABS: 25.0000 mg | ORAL_TABLET | Freq: Every day | ORAL | 0 refills | Status: DC

## 2020-05-26 NOTE — Progress Notes (Addendum)
   S: PCP: Dr. Margarita Rana     Patient arrives in good spirits. Presents to the clinic for hypertension evaluation, counseling, and management.  Patient was referred and last seen by Primary Care Provider on 03/30/2020. We last saw him on 05/12/2020. We made no medication changes and obtained labs. Renal function stable. K was WNL.   Patient denies chest pain, dyspnea, HA or blurred vision. No dizziness or lightheadedness.   Medication adherence reported. He's taken both amlodipine and Hyzaar this morning.   Current BP Medications include:  amlodipine 10 mg daily, Losartan-HCTZ 100-25mg  daily   Dietary habits include: admits to some dietary indiscretion in regard to sodium intake; denies drinking excess caffeine  Exercise habits include: none  Family / Social history:  - FHx: no pertinent positives  - Tobacco: current 1 PPD smoker  - Alcohol: 2-3x weekly (weekly 6 standard beverages)  O:  Vitals:   05/26/20 0953  BP: (!) 151/93   Home BP readings: none   Last 3 Office BP readings: BP Readings from Last 3 Encounters:  05/26/20 (!) 151/93  05/12/20 (!) 151/95  04/11/20 (!) 169/91   BMET    Component Value Date/Time   NA 139 05/12/2020 1055   K 4.3 05/12/2020 1055   CL 98 05/12/2020 1055   CO2 23 05/12/2020 1055   GLUCOSE 164 (H) 05/12/2020 1055   GLUCOSE 139 (H) 03/20/2020 0301   BUN 31 (H) 05/12/2020 1055   CREATININE 0.98 05/12/2020 1055   CREATININE 0.64 (L) 11/21/2015 0915   CALCIUM 9.9 05/12/2020 1055   GFRNONAA >60 03/20/2020 0301   GFRNONAA >89 11/21/2015 0915   GFRAA 136 01/24/2017 1512   GFRAA >89 11/21/2015 0915    Renal function: CrCl cannot be calculated (Unknown ideal weight.).  Clinical ASCVD: No  The 10-year ASCVD risk score Mikey Bussing DC Jr., et al., 2013) is: 35.9%   Values used to calculate the score:     Age: 60 years     Sex: Male     Is Non-Hispanic African American: No     Diabetic: Yes     Tobacco smoker: Yes     Systolic Blood Pressure: 749  mmHg     Is BP treated: Yes     HDL Cholesterol: 47 mg/dL     Total Cholesterol: 189 mg/dL  A/P: Hypertension longstanding currently uncontrolled on current medications but improving. BP Goal = < 130/80 mmHg. Medication adherence reported. Patient has been on the Hyzaar combination, in addition to amlodipine, since 04/11/2020. I recommend to add spironolactone to his regimen. Pt's renal function and electrolyte status is stable and WNL. We also have data to support that add-on spironolactone gives better additional BP lowering than other secondary classes including beta-blockers (Clin Exp Hypertens. 2017;39(3):257-263.). With that being said we will need to get r/p renal and K labs in 1 week. We have scheduled the patient for 1 week follow-up and I have placed this order for future.  -Continue losartan-HCTZ 100-25 mg daily. -Continue amlodipine 10mg  daily.   -Add spironolactone 25 mg daily -BMP, future -Counseled on lifestyle modifications for blood pressure control including reduced dietary sodium, increased exercise, adequate sleep.  Results reviewed and written information provided.   Total time in face-to-face counseling 15 minutes.   F/U Clinic Visit in 1 week.    Benard Halsted, PharmD, [facility], [place] [phone]

## 2020-06-02 ENCOUNTER — Ambulatory Visit: Payer: Self-pay | Attending: Family Medicine | Admitting: Pharmacist

## 2020-06-02 ENCOUNTER — Encounter: Payer: Self-pay | Admitting: Pharmacist

## 2020-06-02 ENCOUNTER — Other Ambulatory Visit: Payer: Self-pay

## 2020-06-02 DIAGNOSIS — E1159 Type 2 diabetes mellitus with other circulatory complications: Secondary | ICD-10-CM

## 2020-06-02 DIAGNOSIS — I152 Hypertension secondary to endocrine disorders: Secondary | ICD-10-CM

## 2020-06-02 NOTE — Progress Notes (Signed)
   S: PCP: Dr. Margarita Rana     Patient arrives in good spirits. Presents to the clinic for hypertension evaluation, counseling, and management.  Patient was referred and last seen by Primary Care Provider on 03/30/2020. We last saw him on 05/26/2020 and added spironolactone.  Patient denies chest pain, dyspnea, HA or blurred vision. No dizziness or lightheadedness.   Medication adherence reported. He's taken both amlodipine, Hyzaar, and spironolactone this morning. Denies side effects to the newly-added spironolactone.   Current BP Medications include:  amlodipine 10 mg daily, Losartan-HCTZ 100-25mg  daily, spironolactone 25 mg daily   Dietary habits include: admits to some dietary indiscretion in regard to sodium intake; denies drinking excess caffeine  Exercise habits include: none  Family / Social history:  - FHx: no pertinent positives  - Tobacco: current 1 PPD smoker  - Alcohol: 2-3x weekly (weekly 6 standard beverages)  O:  Vitals:   06/02/20 1439  BP: (!) 148/86   Home BP readings: none   Last 3 Office BP readings: BP Readings from Last 3 Encounters:  06/02/20 (!) 148/86  05/26/20 (!) 151/93  05/12/20 (!) 151/95   BMET    Component Value Date/Time   NA 139 05/12/2020 1055   K 4.3 05/12/2020 1055   CL 98 05/12/2020 1055   CO2 23 05/12/2020 1055   GLUCOSE 164 (H) 05/12/2020 1055   GLUCOSE 139 (H) 03/20/2020 0301   BUN 31 (H) 05/12/2020 1055   CREATININE 0.98 05/12/2020 1055   CREATININE 0.64 (L) 11/21/2015 0915   CALCIUM 9.9 05/12/2020 1055   GFRNONAA >60 03/20/2020 0301   GFRNONAA >89 11/21/2015 0915   GFRAA 136 01/24/2017 1512   GFRAA >89 11/21/2015 0915    Renal function: CrCl cannot be calculated (Patient's most recent lab result is older than the maximum 21 days allowed.).  Clinical ASCVD: No  The 10-year ASCVD risk score Mikey Bussing DC Jr., et al., 2013) is: 34.9%   Values used to calculate the score:     Age: 60 years     Sex: Male     Is Non-Hispanic African  American: No     Diabetic: Yes     Tobacco smoker: Yes     Systolic Blood Pressure: 628 mmHg     Is BP treated: Yes     HDL Cholesterol: 47 mg/dL     Total Cholesterol: 189 mg/dL  A/P: Hypertension longstanding currently uncontrolled on current medications but improving. BP Goal = < 130/80 mmHg. Medication adherence reported. -Continue losartan-HCTZ 100-25 mg daily. -Continue amlodipine 10mg  daily.   -Continue spironolactone 25 mg daily -CMP14+eGFR -Counseled on lifestyle modifications for blood pressure control including reduced dietary sodium, increased exercise, adequate sleep.  Results reviewed and written information provided.   Total time in face-to-face counseling 15 minutes.   F/U Clinic Visit next month with Dr. Margarita Rana.   Benard Halsted, PharmD, [place], [place] [phone]

## 2020-06-03 ENCOUNTER — Telehealth: Payer: Self-pay

## 2020-06-03 LAB — CMP14+EGFR
ALT: 20 IU/L (ref 0–44)
AST: 12 IU/L (ref 0–40)
Albumin/Globulin Ratio: 1.4 (ref 1.2–2.2)
Albumin: 4.3 g/dL (ref 3.8–4.9)
Alkaline Phosphatase: 61 IU/L (ref 44–121)
BUN/Creatinine Ratio: 38 — ABNORMAL HIGH (ref 10–24)
BUN: 48 mg/dL — ABNORMAL HIGH (ref 8–27)
Bilirubin Total: 0.9 mg/dL (ref 0.0–1.2)
CO2: 22 mmol/L (ref 20–29)
Calcium: 10.2 mg/dL (ref 8.6–10.2)
Chloride: 96 mmol/L (ref 96–106)
Creatinine, Ser: 1.28 mg/dL — ABNORMAL HIGH (ref 0.76–1.27)
Globulin, Total: 3 g/dL (ref 1.5–4.5)
Glucose: 209 mg/dL — ABNORMAL HIGH (ref 65–99)
Potassium: 4.9 mmol/L (ref 3.5–5.2)
Sodium: 137 mmol/L (ref 134–144)
Total Protein: 7.3 g/dL (ref 6.0–8.5)
eGFR: 64 mL/min/{1.73_m2} (ref >59)

## 2020-06-03 NOTE — Telephone Encounter (Signed)
Patient name and DOB has been verified Patient was informed of lab results. Patient had no questions.  

## 2020-06-03 NOTE — Telephone Encounter (Signed)
-----   Message from Charlott Rakes, MD sent at 06/03/2020 10:26 AM EDT ----- Please inform him that his potassium is normal.  Kidney function shows a minimal bump due to his current medication regimen change but nothing additional needs to be done at this time.

## 2020-06-30 ENCOUNTER — Ambulatory Visit: Payer: Self-pay | Attending: Family Medicine | Admitting: Family Medicine

## 2020-06-30 ENCOUNTER — Other Ambulatory Visit: Payer: Self-pay

## 2020-06-30 ENCOUNTER — Encounter: Payer: Self-pay | Admitting: Family Medicine

## 2020-06-30 VITALS — BP 151/85 | HR 109 | Ht 75.0 in | Wt 244.0 lb

## 2020-06-30 DIAGNOSIS — E1159 Type 2 diabetes mellitus with other circulatory complications: Secondary | ICD-10-CM

## 2020-06-30 DIAGNOSIS — I152 Hypertension secondary to endocrine disorders: Secondary | ICD-10-CM

## 2020-06-30 DIAGNOSIS — Z1211 Encounter for screening for malignant neoplasm of colon: Secondary | ICD-10-CM

## 2020-06-30 DIAGNOSIS — E7849 Other hyperlipidemia: Secondary | ICD-10-CM

## 2020-06-30 DIAGNOSIS — E1169 Type 2 diabetes mellitus with other specified complication: Secondary | ICD-10-CM

## 2020-06-30 LAB — GLUCOSE, POCT (MANUAL RESULT ENTRY): POC Glucose: 235 mg/dl — AB (ref 70–99)

## 2020-06-30 LAB — POCT GLYCOSYLATED HEMOGLOBIN (HGB A1C): HbA1c, POC (controlled diabetic range): 6.7 % (ref 0.0–7.0)

## 2020-06-30 NOTE — Progress Notes (Signed)
Subjective:  Patient ID: Donald Bender, male    DOB: [birth year]  Age: 61 y.o. MRN: [id number]  CC: Diabetes   HPI Donald Bender  is a 60 year old male with a history of type 2 diabetes mellitus (A1c 6.7), hypertension, tobacco abuse here for chronic disease management.  Interval History: His BP is elevated and he has not been taking Amlodipine due to financial constraints but has been compliant with spironolactone and losartan. Not compliant with is statin either and his last LDL was 110 (above goal of less than 70).  He endorses compliance with metformin and his A1c 6.7 down from 7.2 previously. He denies presence of blurry vision, numbness in extremities. He is tachycardic and attributes this to being nervous.  Past Medical History:  Diagnosis Date  . Diabetes mellitus without complication ([place])   . Hypertension     No past surgical history on file.  Family History  Problem Relation Age of Onset  . Leukemia Mother 31    No Known Allergies  Outpatient Medications Prior to Visit  Medication Sig Dispense Refill  . aspirin EC 81 MG tablet Take 1 tablet (81 mg total) by mouth daily. 30 tablet 5  . losartan-hydrochlorothiazide (HYZAAR) 100-25 MG tablet Take 1 tablet by mouth daily. 90 tablet 3  . metFORMIN (GLUCOPHAGE-XR) 500 MG 24 hr tablet Take 1 tablet (500 mg total) by mouth daily with breakfast. 30 tablet 6  . spironolactone (ALDACTONE) 25 MG tablet Take 1 tablet (25 mg total) by mouth daily. 90 tablet 0  . amLODipine (NORVASC) 10 MG tablet Take 1 tablet (10 mg total) by mouth daily. (Patient not taking: Reported on 06/30/2020) 30 tablet 6  . atorvastatin (LIPITOR) 40 MG tablet Take 1 tablet (40 mg total) by mouth daily. For cholesterol (Patient not taking: Reported on 06/30/2020) 30 tablet 6  . colchicine 0.6 MG tablet TAKE 2 TABLETS BY MOUTH AT THE ONSET OF A GOUT ATTACK, MAY REPEAT 1 TABLET IN 2 HOURS IF SYMPTOMS PERSIST. (Patient not taking: Reported on 06/30/2020) 90 tablet  3  . nicotine (NICODERM CQ - DOSED IN MG/24 HOURS) 21 mg/24hr patch Place 1 patch (21 mg total) onto the skin daily. (Patient not taking: Reported on 06/30/2020) 28 patch 0  . terbinafine (LAMISIL) 250 MG tablet Take 1 tablet (250 mg total) by mouth daily. (Patient not taking: Reported on 06/30/2020) 30 tablet 2   No facility-administered medications prior to visit.     ROS Review of Systems  Constitutional: Negative for activity change and appetite change.  HENT: Negative for sinus pressure and sore throat.   Eyes: Negative for visual disturbance.  Respiratory: Negative for cough, chest tightness and shortness of breath.   Cardiovascular: Negative for chest pain and leg swelling.  Gastrointestinal: Negative for abdominal distention, abdominal pain, constipation and diarrhea.  Endocrine: Negative.   Genitourinary: Negative for dysuria.  Musculoskeletal: Negative for joint swelling and myalgias.  Skin: Negative for rash.  Allergic/Immunologic: Negative.   Neurological: Negative for weakness, light-headedness and numbness.  Psychiatric/Behavioral: Negative for dysphoric mood and suicidal ideas.    Objective:  BP (!) 151/85   Pulse (!) 109   Ht 6\' 3"  (1.905 m)   Wt 244 lb (110.7 kg)   SpO2 93%   BMI 30.50 kg/m   BP/Weight 06/30/2020 06/02/2020 6/38/4665  Systolic BP 993 570 177  Diastolic BP 85 86 93  Wt. (Lbs) 244 - -  BMI 30.5 - -      Physical Exam Constitutional:  Appearance: He is well-developed.  Neck:     Vascular: No JVD.  Cardiovascular:     Rate and Rhythm: Tachycardia present.     Heart sounds: Normal heart sounds. No murmur heard.   Pulmonary:     Effort: Pulmonary effort is normal.     Breath sounds: Normal breath sounds. No wheezing or rales.  Chest:     Chest wall: No tenderness.  Abdominal:     General: Bowel sounds are normal. There is no distension.     Palpations: Abdomen is soft. There is no mass.     Tenderness: There is no abdominal  tenderness.  Musculoskeletal:        General: Normal range of motion.     Right lower leg: No edema.     Left lower leg: No edema.  Neurological:     Mental Status: He is alert and oriented to person, place, and time.  Psychiatric:        Mood and Affect: Mood normal.     CMP Latest Ref Rng & Units 06/02/2020 05/12/2020 03/20/2020  Glucose 65 - 99 mg/dL 209(H) 164(H) -  BUN 8 - 27 mg/dL 48(H) 31(H) -  Creatinine 0.76 - 1.27 mg/dL 1.28(H) 0.98 -  Sodium 134 - 144 mmol/L 137 139 -  Potassium 3.5 - 5.2 mmol/L 4.9 4.3 -  Chloride 96 - 106 mmol/L 96 98 -  CO2 20 - 29 mmol/L 22 23 -  Calcium 8.6 - 10.2 mg/dL 10.2 9.9 -  Total Protein 6.0 - 8.5 g/dL 7.3 - 6.0(L)  Total Bilirubin 0.0 - 1.2 mg/dL 0.9 - 1.3(H)  Alkaline Phos 44 - 121 IU/L 61 - 45  AST 0 - 40 IU/L 12 - 14(L)  ALT 0 - 44 IU/L 20 - 12    Lipid Panel     Component Value Date/Time   CHOL 189 03/18/2020 1645   CHOL 215 (H) 01/24/2017 1512   TRIG 152 (H) 03/18/2020 1645   HDL 47 03/18/2020 1645   HDL 53 01/24/2017 1512   CHOLHDL 4.0 03/18/2020 1645   VLDL 30 03/18/2020 1645   LDLCALC 112 (H) 03/18/2020 1645   LDLCALC Comment 01/24/2017 1512    CBC    Component Value Date/Time   WBC 7.3 03/20/2020 0301   RBC 5.70 03/20/2020 0301   HGB 19.7 (H) 03/20/2020 0301   HCT 56.4 (H) 03/20/2020 0301   PLT PLATELET CLUMPS NOTED ON SMEAR, UNABLE TO ESTIMATE 03/20/2020 0301   MCV 98.9 03/20/2020 0301   MCH 34.6 (H) 03/20/2020 0301   MCHC 34.9 03/20/2020 0301   RDW 12.6 03/20/2020 0301   LYMPHSABS 1.8 03/18/2020 1009   MONOABS 0.5 03/18/2020 1009   EOSABS 0.3 03/18/2020 1009   BASOSABS 0.1 03/18/2020 1009    Lab Results  Component Value Date   HGBA1C 6.7 06/30/2020    Assessment & Plan:  1. Type 2 diabetes mellitus with other specified complication, without long-term current use of insulin (HCC) Controlled with A1c of 6.7 Goal is less than 7.0 Continue current regimen Counseled on Diabetic diet, my plate method, 224  minutes of moderate intensity exercise/week Blood sugar logs with fasting goals of 80-120 mg/dl, random of less than 180 and in the event of sugars less than 60 mg/dl or greater than 400 mg/dl encouraged to notify the clinic. Advised on the need for annual eye exams, annual foot exams, Pneumonia vaccine. - POCT glucose (manual entry) - POCT glycosylated hemoglobin (Hb A1C)  2. Hypertension associated with  diabetes ([place]) Uncontrolled due to noncompliance and financial constraints He does have his medication at the pharmacy and I have advised him to discuss with the pharmacy options for medication assistance He is also tachycardic today but heart rate was 88 at his last visit with me No regimen change today  3. Other hyperlipidemia Uncontrolled due to noncompliance Compliance has been emphasized Low-cholesterol diet  4. Screening for colon cancer - Fecal occult blood, imunochemical([facility])   Health Care Maintenance: Needs eye exam but unfortunately due to lack of medical coverage he is unable to afford this.  Community resources for eye exams including [facility].  No orders of the defined types were placed in this encounter.   Follow-up: Return in about 3 months (around 09/30/2020) for Medical conditions.       Charlott Rakes, MD, FAAFP. [facility] [place], [place]   06/30/2020, 10:08 AM

## 2020-06-30 NOTE — Patient Instructions (Signed)

## 2020-06-30 NOTE — Progress Notes (Signed)
Having pain in joints.

## 2020-08-31 ENCOUNTER — Other Ambulatory Visit: Payer: Self-pay | Admitting: Family Medicine

## 2020-08-31 DIAGNOSIS — E1169 Type 2 diabetes mellitus with other specified complication: Secondary | ICD-10-CM

## 2020-08-31 MED ORDER — METFORMIN HCL ER 500 MG PO TB24: 500.0000 mg | ORAL_TABLET | Freq: Every day | ORAL | 2 refills | Status: DC

## 2020-08-31 MED ORDER — SPIRONOLACTONE 25 MG PO TABS: 25.0000 mg | ORAL_TABLET | Freq: Every day | ORAL | 0 refills | Status: DC

## 2020-08-31 NOTE — Telephone Encounter (Signed)
Medication Refill - Medication: spironolactone (ALDACTONE) 25 MG tablet   metFORMIN (GLUCOPHAGE-XR) 500 MG 24 hr tablet/   pt stated he was advised by the pharmacy of no refills available for this medication    Has the patient contacted their pharmacy? Yes.   (Agent: If no, request that the patient contact the pharmacy for the refill.) (Agent: If yes, when and what did the pharmacy advise?)no refills   Preferred Pharmacy (with phone number or street name): [place] ([place]), [place] - [address]  [address] [facility])  [address]  Phone:  [phone]  Fax:  [phone]   Agent: Please be advised that RX refills may take up to 3 business days. We ask that you follow-up with your pharmacy.

## 2020-10-05 ENCOUNTER — Ambulatory Visit: Payer: Self-pay | Attending: Family Medicine | Admitting: Family Medicine

## 2020-10-05 ENCOUNTER — Encounter: Payer: Self-pay | Admitting: Family Medicine

## 2020-10-05 ENCOUNTER — Other Ambulatory Visit: Payer: Self-pay

## 2020-10-05 VITALS — BP 166/89 | HR 86 | Temp 98.2°F | Resp 16 | Ht 75.0 in | Wt 253.0 lb

## 2020-10-05 DIAGNOSIS — Z23 Encounter for immunization: Secondary | ICD-10-CM

## 2020-10-05 DIAGNOSIS — E1159 Type 2 diabetes mellitus with other circulatory complications: Secondary | ICD-10-CM

## 2020-10-05 DIAGNOSIS — I152 Hypertension secondary to endocrine disorders: Secondary | ICD-10-CM

## 2020-10-05 DIAGNOSIS — Z1211 Encounter for screening for malignant neoplasm of colon: Secondary | ICD-10-CM

## 2020-10-05 DIAGNOSIS — E1169 Type 2 diabetes mellitus with other specified complication: Secondary | ICD-10-CM

## 2020-10-05 DIAGNOSIS — E7849 Other hyperlipidemia: Secondary | ICD-10-CM

## 2020-10-05 MED ORDER — SPIRONOLACTONE 50 MG PO TABS: 50.0000 mg | ORAL_TABLET | Freq: Every day | ORAL | 1 refills | Status: DC

## 2020-10-05 NOTE — Progress Notes (Signed)
Follow up HTN- Unsure of the BP medication he is taking

## 2020-10-05 NOTE — Patient Instructions (Signed)
Managing Your Hypertension Hypertension, also called high blood pressure, is when the force of the blood pressing against the walls of the arteries is too strong. Arteries are blood vessels that carry blood from your heart throughout your body. Hypertension forces the heart to work harder to pump blood and may cause the arteries tobecome narrow or stiff. Understanding blood pressure readings Your personal target blood pressure may vary depending on your medical conditions, your age, and other factors. A blood pressure reading includes a higher number over a lower number. Ideally, your blood pressure should be below 120/80. You should know that: The first, or top, number is called the systolic pressure. It is a measure of the pressure in your arteries as your heart beats. The second, or bottom number, is called the diastolic pressure. It is a measure of the pressure in your arteries as the heart relaxes. Blood pressure is classified into four stages. Based on your blood pressure reading, your health care provider may use the following stages to determine what type of treatment you need, if any. Systolic pressure and diastolicpressure are measured in a unit called mmHg. Normal Systolic pressure: below 120. Diastolic pressure: below 80. Elevated Systolic pressure: 120-129. Diastolic pressure: below 80. Hypertension stage 1 Systolic pressure: 130-139. Diastolic pressure: 80-89. Hypertension stage 2 Systolic pressure: 140 or above. Diastolic pressure: 90 or above. How can this condition affect me? Managing your hypertension is an important responsibility. Over time, hypertension can damage the arteries and decrease blood flow to important parts of the body, including the brain, heart, and kidneys. Having untreated or uncontrolled hypertension can lead to: A heart attack. A stroke. A weakened blood vessel (aneurysm). Heart failure. Kidney damage. Eye damage. Metabolic syndrome. Memory and  concentration problems. Vascular dementia. What actions can I take to manage this condition? Hypertension can be managed by making lifestyle changes and possibly by taking medicines. Your health care provider will help you make a plan to bring yourblood pressure within a normal range. Nutrition  Eat a diet that is high in fiber and potassium, and low in salt (sodium), added sugar, and fat. An example eating plan is called the Dietary Approaches to Stop Hypertension (DASH) diet. To eat this way: Eat plenty of fresh fruits and vegetables. Try to fill one-half of your plate at each meal with fruits and vegetables. Eat whole grains, such as whole-wheat pasta, brown rice, or whole-grain bread. Fill about one-fourth of your plate with whole grains. Eat low-fat dairy products. Avoid fatty cuts of meat, processed or cured meats, and poultry with skin. Fill about one-fourth of your plate with lean proteins such as fish, chicken without skin, beans, eggs, and tofu. Avoid pre-made and processed foods. These tend to be higher in sodium, added sugar, and fat. Reduce your daily sodium intake. Most people with hypertension should eat less than 1,500 mg of sodium a day.  Lifestyle  Work with your health care provider to maintain a healthy body weight or to lose weight. Ask what an ideal weight is for you. Get at least 30 minutes of exercise that causes your heart to beat faster (aerobic exercise) most days of the week. Activities may include walking, swimming, or biking. Include exercise to strengthen your muscles (resistance exercise), such as weight lifting, as part of your weekly exercise routine. Try to do these types of exercises for 30 minutes at least 3 days a week. Do not use any products that contain nicotine or tobacco, such as cigarettes, e-cigarettes, and chewing   tobacco. If you need help quitting, ask your health care provider. Control any long-term (chronic) conditions you have, such as high  cholesterol or diabetes. Identify your sources of stress and find ways to manage stress. This may include meditation, deep breathing, or making time for fun activities.  Alcohol use Do not drink alcohol if: Your health care provider tells you not to drink. You are pregnant, may be pregnant, or are planning to become pregnant. If you drink alcohol: Limit how much you use to: 0-1 drink a day for women. 0-2 drinks a day for men. Be aware of how much alcohol is in your drink. In the [place] one drink equals one 12 oz bottle of beer (355 mL), one 5 oz glass of wine (148 mL), or one 1 oz glass of hard liquor (44 mL). Medicines Your health care provider may prescribe medicine if lifestyle changes are not enough to get your blood pressure under control and if: Your systolic blood pressure is 130 or higher. Your diastolic blood pressure is 80 or higher. Take medicines only as told by your health care provider. Follow the directions carefully. Blood pressure medicines must be taken as told by your health care provider. The medicine does not work as well when you skip doses. Skippingdoses also puts you at risk for problems. Monitoring Before you monitor your blood pressure: Do not smoke, drink caffeinated beverages, or exercise within 30 minutes before taking a measurement. Use the bathroom and empty your bladder (urinate). Sit quietly for at least 5 minutes before taking measurements. Monitor your blood pressure at home as told by your health care provider. To do this: Sit with your back straight and supported. Place your feet flat on the floor. Do not cross your legs. Support your arm on a flat surface, such as a table. Make sure your upper arm is at heart level. Each time you measure, take two or three readings one minute apart and record the results. You may also need to have your blood pressure checked regularly by your healthcare provider. General information Talk with your health care  provider about your diet, exercise habits, and other lifestyle factors that may be contributing to hypertension. Review all the medicines you take with your health care provider because there may be side effects or interactions. Keep all visits as told by your health care provider. Your health care provider can help you create and adjust your plan for managing your high blood pressure. Where to find more information National Heart, Lung, and Blood Institute: www.nhlbi.nih.gov American Heart Association: www.heart.org Contact a health care provider if: You think you are having a reaction to medicines you have taken. You have repeated (recurrent) headaches. You feel dizzy. You have swelling in your ankles. You have trouble with your vision. Get help right away if: You develop a severe headache or confusion. You have unusual weakness or numbness, or you feel faint. You have severe pain in your chest or abdomen. You vomit repeatedly. You have trouble breathing. These symptoms may represent a serious problem that is an emergency. Do not wait to see if the symptoms will go away. Get medical help right away. Call your local emergency services (911 in the [place] Do not drive yourself to the hospital. Summary Hypertension is when the force of blood pumping through your arteries is too strong. If this condition is not controlled, it may put you at risk for serious complications. Your personal target blood pressure may vary depending on your medical conditions,   your age, and other factors. For most people, a normal blood pressure is less than 120/80. Hypertension is managed by lifestyle changes, medicines, or both. Lifestyle changes to help manage hypertension include losing weight, eating a healthy, low-sodium diet, exercising more, stopping smoking, and limiting alcohol. This information is not intended to replace advice given to you by your health care provider. Make sure you discuss any questions  you have with your healthcare provider. Document Revised: 02/20/2019 Document Reviewed: 12/16/2018 Elsevier Patient Education  2022 [employer]  

## 2020-10-05 NOTE — Progress Notes (Signed)
Subjective:  Patient ID: Donald Bender, male    DOB: 1960-11-13  Age: 60 y.o. MRN: [id number]  CC: Hypertension   HPI Donald Bender is a 60 y.o. year old male with a history of type 2 diabetes mellitus (A1c 6.7), hypertension, polycythemia, tobacco abuse here for chronic disease management.  Interval History: His blood pressure is elevated and he endorses compliance with his antihypertensive.  He is also compliant with his statin and doing well on metformin for his diabetes. Denies hypoglycemia, numbness in extremities or visual concerns. He does have slight back pain from heavy lifting but otherwise is doing well.  States he has had similar back pain in the past and will just stretch Past Medical History:  Diagnosis Date   Diabetes mellitus without complication ([place])    Hypertension     History reviewed. No pertinent surgical history.  Family History  Problem Relation Age of Onset   Leukemia Mother 48    No Known Allergies  Outpatient Medications Prior to Visit  Medication Sig Dispense Refill   aspirin EC 81 MG tablet Take 1 tablet (81 mg total) by mouth daily. 30 tablet 5   atorvastatin (LIPITOR) 40 MG tablet Take 1 tablet (40 mg total) by mouth daily. For cholesterol 30 tablet 6   metFORMIN (GLUCOPHAGE-XR) 500 MG 24 hr tablet Take 1 tablet (500 mg total) by mouth daily with breakfast. 30 tablet 2   amLODipine (NORVASC) 10 MG tablet Take 1 tablet (10 mg total) by mouth daily. 30 tablet 6   colchicine 0.6 MG tablet TAKE 2 TABLETS BY MOUTH AT THE ONSET OF A GOUT ATTACK, MAY REPEAT 1 TABLET IN 2 HOURS IF SYMPTOMS PERSIST. (Patient not taking: No sig reported) 90 tablet 3   losartan-hydrochlorothiazide (HYZAAR) 100-25 MG tablet Take 1 tablet by mouth daily. 90 tablet 3   nicotine (NICODERM CQ - DOSED IN MG/24 HOURS) 21 mg/24hr patch Place 1 patch (21 mg total) onto the skin daily. (Patient not taking: No sig reported) 28 patch 0   spironolactone (ALDACTONE) 25 MG tablet Take 1  tablet (25 mg total) by mouth daily. 90 tablet 0   terbinafine (LAMISIL) 250 MG tablet Take 1 tablet (250 mg total) by mouth daily. (Patient not taking: No sig reported) 30 tablet 2   No facility-administered medications prior to visit.     ROS Review of Systems  Constitutional:  Negative for activity change and appetite change.  HENT:  Negative for sinus pressure and sore throat.   Eyes:  Negative for visual disturbance.  Respiratory:  Negative for cough, chest tightness and shortness of breath.   Cardiovascular:  Negative for chest pain and leg swelling.  Gastrointestinal:  Negative for abdominal distention, abdominal pain, constipation and diarrhea.  Endocrine: Negative.   Genitourinary:  Negative for dysuria.  Musculoskeletal:  Positive for back pain. Negative for joint swelling and myalgias.  Skin:  Negative for rash.  Allergic/Immunologic: Negative.   Neurological:  Negative for weakness, light-headedness and numbness.  Psychiatric/Behavioral:  Negative for dysphoric mood and suicidal ideas.    Objective:  BP (!) 166/89 (BP Location: Right Arm, Patient Position: Sitting, Cuff Size: Large)   Pulse 86   Temp 98.2 F (36.8 C) (Oral)   Resp 16   Ht 6' 3"  (1.905 m)   Wt 253 lb (114.8 kg)   SpO2 94%   BMI 31.62 kg/m   BP/Weight 10/05/2020 04/03/4654 08/29/2749  Systolic BP 700 174 944  Diastolic BP 89 85 86  Wt. (Lbs) 253 244 -  BMI 31.62 30.5 -      Physical Exam Constitutional:      Appearance: He is well-developed.  Cardiovascular:     Rate and Rhythm: Normal rate.     Heart sounds: Normal heart sounds. No murmur heard. Pulmonary:     Effort: Pulmonary effort is normal.     Breath sounds: Normal breath sounds. No wheezing or rales.  Chest:     Chest wall: No tenderness.  Abdominal:     General: Bowel sounds are normal. There is no distension.     Palpations: Abdomen is soft. There is no mass.     Tenderness: There is no abdominal tenderness.  Musculoskeletal:         General: Normal range of motion.     Right lower leg: No edema.     Left lower leg: No edema.  Neurological:     Mental Status: He is alert and oriented to person, place, and time.  Psychiatric:        Mood and Affect: Mood normal.    CMP Latest Ref Rng & Units 06/02/2020 05/12/2020 03/20/2020  Glucose 65 - 99 mg/dL 209(H) 164(H) -  BUN 8 - 27 mg/dL 48(H) 31(H) -  Creatinine 0.76 - 1.27 mg/dL 1.28(H) 0.98 -  Sodium 134 - 144 mmol/L 137 139 -  Potassium 3.5 - 5.2 mmol/L 4.9 4.3 -  Chloride 96 - 106 mmol/L 96 98 -  CO2 20 - 29 mmol/L 22 23 -  Calcium 8.6 - 10.2 mg/dL 10.2 9.9 -  Total Protein 6.0 - 8.5 g/dL 7.3 - 6.0(L)  Total Bilirubin 0.0 - 1.2 mg/dL 0.9 - 1.3(H)  Alkaline Phos 44 - 121 IU/L 61 - 45  AST 0 - 40 IU/L 12 - 14(L)  ALT 0 - 44 IU/L 20 - 12    Lipid Panel     Component Value Date/Time   CHOL 189 03/18/2020 1645   CHOL 215 (H) 01/24/2017 1512   TRIG 152 (H) 03/18/2020 1645   HDL 47 03/18/2020 1645   HDL 53 01/24/2017 1512   CHOLHDL 4.0 03/18/2020 1645   VLDL 30 03/18/2020 1645   LDLCALC 112 (H) 03/18/2020 1645   LDLCALC Comment 01/24/2017 1512    CBC    Component Value Date/Time   WBC 7.3 03/20/2020 0301   RBC 5.70 03/20/2020 0301   HGB 19.7 (H) 03/20/2020 0301   HCT 56.4 (H) 03/20/2020 0301   PLT PLATELET CLUMPS NOTED ON SMEAR, UNABLE TO ESTIMATE 03/20/2020 0301   MCV 98.9 03/20/2020 0301   MCH 34.6 (H) 03/20/2020 0301   MCHC 34.9 03/20/2020 0301   RDW 12.6 03/20/2020 0301   LYMPHSABS 1.8 03/18/2020 1009   MONOABS 0.5 03/18/2020 1009   EOSABS 0.3 03/18/2020 1009   BASOSABS 0.1 03/18/2020 1009    Lab Results  Component Value Date   HGBA1C 6.7 06/30/2020    Assessment & Plan:  1. Type 2 diabetes mellitus with other specified complication, without long-term current use of insulin (HCC) Controlled with A1c of 6.7 Continue metformin Discussed community resources for annual eye exam given he has no medical coverage Counseled on Diabetic diet,  my plate method, 294 minutes of moderate intensity exercise/week Blood sugar logs with fasting goals of 80-120 mg/dl, random of less than 180 and in the event of sugars less than 60 mg/dl or greater than 400 mg/dl encouraged to notify the clinic. Advised on the need for annual eye exams, annual foot exams,  Pneumonia vaccine. - CMP14+EGFR; Future - Lipid panel; Future  2. Hypertension associated with diabetes ([place]) Uncontrolled Dose of spironolactone increased We will check potassium in 2 weeks to assess for possible hyperkalemia Continue amlodipine, losartan/HCTZ Counseled on blood pressure goal of less than 130/80, low-sodium, DASH diet, medication compliance, 150 minutes of moderate intensity exercise per week. Discussed medication compliance, adverse effects. - spironolactone (ALDACTONE) 50 MG tablet; Take 1 tablet (50 mg total) by mouth daily.  Dispense: 90 tablet; Refill: 1  3. Other hyperlipidemia Last LDL was slightly above goal Will repeat lipid panel Continue atorvastatin Low-cholesterol diet  4. Screening for colon cancer - Fecal occult blood, imunochemical([facility])  5. Need for immunization against influenza - Flu Vaccine QUAD 30moIM (Fluarix, Fluzone & Alfiuria Quad PF)   Health Care Maintenance: PCV 20 is indicated and he will need to go through approval process with the medication assistance program and the pharmacy prior to receiving this.  Paperwork has been started.  Meds ordered this encounter  Medications   spironolactone (ALDACTONE) 50 MG tablet    Sig: Take 1 tablet (50 mg total) by mouth daily.    Dispense:  90 tablet    Refill:  1    Dose increase     Follow-up: Return in about 3 months (around 01/04/2021) for Medical conditions.       ECharlott Rakes MD, FAAFP. [facility] [place] [facility]  10/05/2020, 2:19 PM

## 2020-12-26 ENCOUNTER — Other Ambulatory Visit: Payer: Self-pay | Admitting: Family Medicine

## 2020-12-26 DIAGNOSIS — E1169 Type 2 diabetes mellitus with other specified complication: Secondary | ICD-10-CM

## 2020-12-27 ENCOUNTER — Other Ambulatory Visit: Payer: Self-pay | Admitting: Family Medicine

## 2020-12-27 DIAGNOSIS — E1169 Type 2 diabetes mellitus with other specified complication: Secondary | ICD-10-CM

## 2020-12-27 NOTE — Telephone Encounter (Signed)
Requested Prescriptions  Pending Prescriptions Disp Refills  . metFORMIN (GLUCOPHAGE-XR) 500 MG 24 hr tablet [Pharmacy Med Name: metFORMIN HCl ER 500 MG Oral Tablet Extended Release 24 Hour] 30 tablet 3    Sig: Take 1 tablet by mouth once daily with breakfast     Endocrinology:  Diabetes - Biguanides Failed - 12/26/2020 11:28 AM      Failed - Cr in normal range and within 360 days    Creat  Date Value Ref Range Status  11/21/2015 0.64 (L) 0.70 - 1.33 mg/dL Final    Comment:      For patients > or = 60 years of age: The upper reference limit for Creatinine is approximately 13% higher for people identified as African-American.      Creatinine, Ser  Date Value Ref Range Status  06/02/2020 1.28 (H) 0.76 - 1.27 mg/dL Final   Creatinine, Urine  Date Value Ref Range Status  11/21/2015 166 20 - 370 mg/dL Final         Passed - HBA1C is between 0 and 7.9 and within 180 days    HbA1c, POC (controlled diabetic range)  Date Value Ref Range Status  06/30/2020 6.7 0.0 - 7.0 % Final         Passed - eGFR in normal range and within 360 days    GFR, Est African American  Date Value Ref Range Status  11/21/2015 >89 >=60 mL/min Final   GFR calc Af Amer  Date Value Ref Range Status  01/24/2017 136 >59 mL/min/1.73 Final   GFR, Est Non African American  Date Value Ref Range Status  11/21/2015 >89 >=60 mL/min Final   GFR, Estimated  Date Value Ref Range Status  03/20/2020 >60 >60 mL/min Final    Comment:    (NOTE) Calculated using the CKD-EPI Creatinine Equation (2021)    eGFR  Date Value Ref Range Status  06/02/2020 64 >59 mL/min/1.73 Final         Passed - Valid encounter within last 6 months    Recent Outpatient Visits          2 months ago Type 2 diabetes mellitus with other specified complication, without long-term current use of insulin ([place])   [place], Lyndon, MD   6 months ago Type 2 diabetes mellitus with other specified  complication, without long-term current use of insulin ([place])   [place], Charlane Ferretti, MD   6 months ago Hypertension associated with diabetes [facility])   [place], Annie Main L, RPH-CPP   7 months ago Hypertension associated with diabetes [facility])   [place], Annie Main L, RPH-CPP   7 months ago Hypertension associated with diabetes [facility])   [place], RPH-CPP

## 2020-12-27 NOTE — Telephone Encounter (Signed)
Copied from [place] [id number]. Topic: Quick Communication - Rx Refill/Question >> Dec 27, 2020  3:50 PM Lenon Curt, Everette A wrote: Medication: metFORMIN (GLUCOPHAGE-XR) 500 MG 24 hr tablet [[id number]]   Has the patient contacted their pharmacy? Yes.  The patient has been directed to contact their PCP (Agent: If no, request that the patient contact the pharmacy for the refill. If patient does not wish to contact the pharmacy document the reason why and proceed with request.) (Agent: If yes, when and what did the pharmacy advise?)  Preferred Pharmacy (with phone number or street name): [place] ([place]), [place] - [address]  Phone:  [phone] Fax:  [phone]    Has the patient been seen for an appointment in the last year OR does the patient have an upcoming appointment? Yes.    Agent: Please be advised that RX refills may take up to 3 business days. We ask that you follow-up with your pharmacy.

## 2020-12-30 NOTE — Telephone Encounter (Signed)
Already ordered on 12/27/20 #30/3 refills, will refuse this request.

## 2021-07-24 ENCOUNTER — Encounter (HOSPITAL_COMMUNITY): Payer: Self-pay

## 2021-07-24 ENCOUNTER — Inpatient Hospital Stay (HOSPITAL_COMMUNITY)
Admission: EM | Admit: 2021-07-24 | Discharge: 2021-07-28 | DRG: 391 | Disposition: A | Discharge status: Home / Self Care | Payer: Self-pay | Attending: Internal Medicine | Admitting: Internal Medicine

## 2021-07-24 ENCOUNTER — Other Ambulatory Visit: Payer: Self-pay

## 2021-07-24 DIAGNOSIS — Z91199 Patient's noncompliance with other medical treatment and regimen due to unspecified reason: Secondary | ICD-10-CM

## 2021-07-24 DIAGNOSIS — D72829 Elevated white blood cell count, unspecified: Secondary | ICD-10-CM | POA: Diagnosis present

## 2021-07-24 DIAGNOSIS — Z5986 Financial insecurity: Secondary | ICD-10-CM

## 2021-07-24 DIAGNOSIS — F17210 Nicotine dependence, cigarettes, uncomplicated: Secondary | ICD-10-CM | POA: Diagnosis present

## 2021-07-24 DIAGNOSIS — Z806 Family history of leukemia: Secondary | ICD-10-CM

## 2021-07-24 DIAGNOSIS — D696 Thrombocytopenia, unspecified: Secondary | ICD-10-CM | POA: Diagnosis present

## 2021-07-24 DIAGNOSIS — I160 Hypertensive urgency: Secondary | ICD-10-CM | POA: Diagnosis present

## 2021-07-24 DIAGNOSIS — Z720 Tobacco use: Secondary | ICD-10-CM | POA: Diagnosis present

## 2021-07-24 DIAGNOSIS — M109 Gout, unspecified: Secondary | ICD-10-CM | POA: Diagnosis present

## 2021-07-24 DIAGNOSIS — Z79899 Other long term (current) drug therapy: Secondary | ICD-10-CM

## 2021-07-24 DIAGNOSIS — E1165 Type 2 diabetes mellitus with hyperglycemia: Secondary | ICD-10-CM | POA: Diagnosis present

## 2021-07-24 DIAGNOSIS — Z597 Insufficient social insurance and welfare support: Secondary | ICD-10-CM

## 2021-07-24 DIAGNOSIS — I10 Essential (primary) hypertension: Secondary | ICD-10-CM | POA: Diagnosis present

## 2021-07-24 DIAGNOSIS — Z6831 Body mass index (BMI) 31.0-31.9, adult: Secondary | ICD-10-CM

## 2021-07-24 DIAGNOSIS — R0902 Hypoxemia: Secondary | ICD-10-CM | POA: Diagnosis present

## 2021-07-24 DIAGNOSIS — Z91148 Patient's other noncompliance with medication regimen for other reason: Secondary | ICD-10-CM

## 2021-07-24 DIAGNOSIS — E119 Type 2 diabetes mellitus without complications: Secondary | ICD-10-CM

## 2021-07-24 DIAGNOSIS — K651 Peritoneal abscess: Secondary | ICD-10-CM | POA: Diagnosis present

## 2021-07-24 DIAGNOSIS — E781 Pure hyperglyceridemia: Secondary | ICD-10-CM | POA: Diagnosis present

## 2021-07-24 DIAGNOSIS — K5720 Diverticulitis of large intestine with perforation and abscess without bleeding: Principal | ICD-10-CM | POA: Diagnosis present

## 2021-07-24 DIAGNOSIS — Z5971 Insufficient health insurance coverage: Secondary | ICD-10-CM

## 2021-07-24 DIAGNOSIS — K4090 Unilateral inguinal hernia, without obstruction or gangrene, not specified as recurrent: Secondary | ICD-10-CM

## 2021-07-24 DIAGNOSIS — R6510 Systemic inflammatory response syndrome (SIRS) of non-infectious origin without acute organ dysfunction: Secondary | ICD-10-CM | POA: Diagnosis present

## 2021-07-24 DIAGNOSIS — Z7982 Long term (current) use of aspirin: Secondary | ICD-10-CM

## 2021-07-24 DIAGNOSIS — Z7984 Long term (current) use of oral hypoglycemic drugs: Secondary | ICD-10-CM

## 2021-07-24 DIAGNOSIS — Z5989 Other problems related to housing and economic circumstances: Secondary | ICD-10-CM

## 2021-07-24 DIAGNOSIS — J9601 Acute respiratory failure with hypoxia: Secondary | ICD-10-CM | POA: Diagnosis present

## 2021-07-24 DIAGNOSIS — K5900 Constipation, unspecified: Secondary | ICD-10-CM | POA: Diagnosis present

## 2021-07-24 DIAGNOSIS — E669 Obesity, unspecified: Secondary | ICD-10-CM | POA: Diagnosis present

## 2021-07-24 DIAGNOSIS — F1010 Alcohol abuse, uncomplicated: Secondary | ICD-10-CM | POA: Diagnosis present

## 2021-07-24 DIAGNOSIS — D751 Secondary polycythemia: Secondary | ICD-10-CM | POA: Diagnosis present

## 2021-07-24 LAB — CBC WITH DIFFERENTIAL/PLATELET
Abs Immature Granulocytes: 0.06 10*3/uL (ref 0.00–0.07)
Basophils Absolute: 0.1 10*3/uL (ref 0.0–0.1)
Basophils Relative: 1 %
Eosinophils Absolute: 0.3 10*3/uL (ref 0.0–0.5)
Eosinophils Relative: 3 %
HCT: 56.5 % — ABNORMAL HIGH (ref 39.0–52.0)
Hemoglobin: 18.1 g/dL — ABNORMAL HIGH (ref 13.0–17.0)
Immature Granulocytes: 1 %
Lymphocytes Relative: 14 %
Lymphs Abs: 1.6 10*3/uL (ref 0.7–4.0)
MCH: 31.9 pg (ref 26.0–34.0)
MCHC: 32 g/dL (ref 30.0–36.0)
MCV: 99.6 fL (ref 80.0–100.0)
Monocytes Absolute: 0.9 10*3/uL (ref 0.1–1.0)
Monocytes Relative: 7 %
Neutro Abs: 8.7 10*3/uL — ABNORMAL HIGH (ref 1.7–7.7)
Neutrophils Relative %: 74 %
Platelets: 148 10*3/uL — ABNORMAL LOW (ref 150–400)
RBC: 5.67 MIL/uL (ref 4.22–5.81)
RDW: 12.4 % (ref 11.5–15.5)
WBC: 11.6 10*3/uL — ABNORMAL HIGH (ref 4.0–10.5)
nRBC: 0 % (ref 0.0–0.2)

## 2021-07-24 LAB — URINALYSIS, ROUTINE W REFLEX MICROSCOPIC
Bilirubin Urine: NEGATIVE
Glucose, UA: NEGATIVE mg/dL
Ketones, ur: NEGATIVE mg/dL
Leukocytes,Ua: NEGATIVE
Nitrite: NEGATIVE
Protein, ur: >=300 mg/dL — AB
Specific Gravity, Urine: 1.025 (ref 1.005–1.030)
pH: 5 (ref 5.0–8.0)

## 2021-07-24 LAB — COMPREHENSIVE METABOLIC PANEL
ALT: 13 U/L (ref 0–44)
AST: 15 U/L (ref 15–41)
Albumin: 2.8 g/dL — ABNORMAL LOW (ref 3.5–5.0)
Alkaline Phosphatase: 61 U/L (ref 38–126)
Anion gap: 10 (ref 5–15)
BUN: 12 mg/dL (ref 8–23)
CO2: 29 mmol/L (ref 22–32)
Calcium: 9 mg/dL (ref 8.9–10.3)
Chloride: 99 mmol/L (ref 98–111)
Creatinine, Ser: 0.58 mg/dL — ABNORMAL LOW (ref 0.61–1.24)
GFR, Estimated: >60 mL/min (ref >60)
Glucose, Bld: 157 mg/dL — ABNORMAL HIGH (ref 70–99)
Potassium: 3.9 mmol/L (ref 3.5–5.1)
Sodium: 138 mmol/L (ref 135–145)
Total Bilirubin: 1 mg/dL (ref 0.3–1.2)
Total Protein: 7.4 g/dL (ref 6.5–8.1)

## 2021-07-24 LAB — LIPASE, BLOOD: Lipase: 49 U/L (ref 11–51)

## 2021-07-24 NOTE — ED Triage Notes (Signed)
Pt to ED pov. Pt has had LLQ abdominal pain and white stool x 3 weeks. Pt has not had normal bowel movements, it is more of pieces at a time. Pt has been able to eat, denies nausea/vomiting.

## 2021-07-24 NOTE — ED Provider Triage Note (Signed)
Emergency Medicine Provider Triage Evaluation Note  Donald Bender , a 62 y.o. male  was evaluated in triage.  Pt complains of left lower quadrant and suprapubic abdominal pain.  Symptoms have gradually worsened over the past 3 weeks.  Reports light-colored stools.  Denies any vomiting.  No urinary symptoms, no chest pain or shortness of breath.  Admits that he has been out of his antihypertensive medications for several months and is unable to see a primary care provider.  Review of Systems  Positive: Abdominal pain, light-colored stools Negative: Chest pain, shortness of breath  Physical Exam  BP (!) 203/103   Pulse (!) 104   Temp 98.6 F (37 C) (Oral)   Resp 16   Ht 6\' 3"  (1.905 m)   Wt 115.7 kg   SpO2 91%   BMI 31.87 kg/m  Gen:   Awake, no distress   Resp:  Normal effort  MSK:   Moves extremities without difficulty  Other:  Left lower quadrant and suprapubic tenderness without rebound or guarding  Medical Decision Making  Medically screening exam initiated at 7:20 PM.  Appropriate orders placed.  Dareen Piano was informed that the remainder of the evaluation will be completed by another provider, this initial triage assessment does not replace that evaluation, and the importance of remaining in the ED until their evaluation is complete.  Labs and urinalysis ordered   Dietrich Pates, Cordelia Poche 07/24/21 1921

## 2021-07-25 ENCOUNTER — Inpatient Hospital Stay (HOSPITAL_COMMUNITY): Payer: Self-pay

## 2021-07-25 ENCOUNTER — Emergency Department (HOSPITAL_COMMUNITY): Payer: Self-pay

## 2021-07-25 DIAGNOSIS — K4020 Bilateral inguinal hernia, without obstruction or gangrene, not specified as recurrent: Secondary | ICD-10-CM

## 2021-07-25 DIAGNOSIS — J9601 Acute respiratory failure with hypoxia: Secondary | ICD-10-CM

## 2021-07-25 DIAGNOSIS — I160 Hypertensive urgency: Secondary | ICD-10-CM

## 2021-07-25 DIAGNOSIS — F1010 Alcohol abuse, uncomplicated: Secondary | ICD-10-CM

## 2021-07-25 DIAGNOSIS — K5720 Diverticulitis of large intestine with perforation and abscess without bleeding: Principal | ICD-10-CM

## 2021-07-25 DIAGNOSIS — D72829 Elevated white blood cell count, unspecified: Secondary | ICD-10-CM

## 2021-07-25 DIAGNOSIS — E119 Type 2 diabetes mellitus without complications: Secondary | ICD-10-CM

## 2021-07-25 DIAGNOSIS — R935 Abnormal findings on diagnostic imaging of other abdominal regions, including retroperitoneum: Secondary | ICD-10-CM

## 2021-07-25 DIAGNOSIS — K4090 Unilateral inguinal hernia, without obstruction or gangrene, not specified as recurrent: Secondary | ICD-10-CM

## 2021-07-25 DIAGNOSIS — Z720 Tobacco use: Secondary | ICD-10-CM

## 2021-07-25 DIAGNOSIS — D751 Secondary polycythemia: Secondary | ICD-10-CM

## 2021-07-25 DIAGNOSIS — E669 Obesity, unspecified: Secondary | ICD-10-CM | POA: Diagnosis present

## 2021-07-25 DIAGNOSIS — R6510 Systemic inflammatory response syndrome (SIRS) of non-infectious origin without acute organ dysfunction: Secondary | ICD-10-CM

## 2021-07-25 DIAGNOSIS — Z5989 Other problems related to housing and economic circumstances: Secondary | ICD-10-CM

## 2021-07-25 HISTORY — DX: Acute respiratory failure with hypoxia: J96.01

## 2021-07-25 LAB — LIPID PANEL
Cholesterol: 186 mg/dL (ref 0–200)
HDL: 33 mg/dL — ABNORMAL LOW (ref >40)
LDL Cholesterol: 121 mg/dL — ABNORMAL HIGH (ref 0–99)
Total CHOL/HDL Ratio: 5.6 RATIO
Triglycerides: 158 mg/dL — ABNORMAL HIGH (ref <150)
VLDL: 32 mg/dL (ref 0–40)

## 2021-07-25 LAB — HEPATIC FUNCTION PANEL
ALT: 11 U/L (ref 0–44)
AST: 12 U/L — ABNORMAL LOW (ref 15–41)
Albumin: 2.3 g/dL — ABNORMAL LOW (ref 3.5–5.0)
Alkaline Phosphatase: 50 U/L (ref 38–126)
Bilirubin, Direct: 0.3 mg/dL — ABNORMAL HIGH (ref 0.0–0.2)
Indirect Bilirubin: 0.6 mg/dL (ref 0.3–0.9)
Total Bilirubin: 0.9 mg/dL (ref 0.3–1.2)
Total Protein: 6.4 g/dL — ABNORMAL LOW (ref 6.5–8.1)

## 2021-07-25 LAB — HIV ANTIBODY (ROUTINE TESTING W REFLEX): HIV Screen 4th Generation wRfx: NONREACTIVE

## 2021-07-25 LAB — HEPATITIS PANEL, ACUTE
HCV Ab: NONREACTIVE
Hep A IgM: NONREACTIVE
Hep B C IgM: NONREACTIVE
Hepatitis B Surface Ag: NONREACTIVE

## 2021-07-25 LAB — TSH: TSH: 3.937 u[IU]/mL (ref 0.350–4.500)

## 2021-07-25 MED ORDER — PANTOPRAZOLE SODIUM 40 MG IV SOLR
40.0000 mg | INTRAVENOUS | Status: DC
  Administered 2021-07-25 – 2021-07-27 (×3): 40 mg via INTRAVENOUS
  Filled 2021-07-25 (×3): qty 10

## 2021-07-25 MED ORDER — HYDRALAZINE HCL 20 MG/ML IJ SOLN
10.0000 mg | INTRAMUSCULAR | Status: DC | PRN
  Administered 2021-07-26 – 2021-07-28 (×3): 10 mg via INTRAVENOUS
  Filled 2021-07-25 (×3): qty 1

## 2021-07-25 MED ORDER — ACETAMINOPHEN 650 MG RE SUPP: 650.0000 mg | Freq: Four times a day (QID) | RECTAL | Status: DC | PRN

## 2021-07-25 MED ORDER — THIAMINE HCL 100 MG PO TABS
100.0000 mg | ORAL_TABLET | Freq: Every day | ORAL | Status: DC
  Administered 2021-07-25 – 2021-07-28 (×4): 100 mg via ORAL
  Filled 2021-07-25 (×4): qty 1

## 2021-07-25 MED ORDER — NICOTINE 21 MG/24HR TD PT24
21.0000 mg | MEDICATED_PATCH | Freq: Every day | TRANSDERMAL | Status: DC
  Administered 2021-07-25 – 2021-07-28 (×4): 21 mg via TRANSDERMAL
  Filled 2021-07-25 (×4): qty 1

## 2021-07-25 MED ORDER — SODIUM CHLORIDE 0.9 % IV SOLN: INTRAVENOUS | Status: DC

## 2021-07-25 MED ORDER — LABETALOL HCL 5 MG/ML IV SOLN
10.0000 mg | INTRAVENOUS | Status: DC | PRN
  Administered 2021-07-28: 10 mg via INTRAVENOUS
  Filled 2021-07-25: qty 4

## 2021-07-25 MED ORDER — ONDANSETRON HCL 4 MG/2ML IJ SOLN: 4.0000 mg | Freq: Four times a day (QID) | INTRAMUSCULAR | Status: DC | PRN

## 2021-07-25 MED ORDER — LORAZEPAM 1 MG PO TABS: 1.0000 mg | ORAL_TABLET | ORAL | Status: AC | PRN

## 2021-07-25 MED ORDER — IOHEXOL 300 MG/ML  SOLN
100.0000 mL | Freq: Once | INTRAMUSCULAR | Status: AC | PRN
  Administered 2021-07-25: 100 mL via INTRAVENOUS

## 2021-07-25 MED ORDER — ENOXAPARIN SODIUM 40 MG/0.4ML IJ SOSY
40.0000 mg | PREFILLED_SYRINGE | INTRAMUSCULAR | Status: DC
  Administered 2021-07-25 – 2021-07-27 (×3): 40 mg via SUBCUTANEOUS
  Filled 2021-07-25 (×4): qty 0.4

## 2021-07-25 MED ORDER — LORAZEPAM 2 MG/ML IJ SOLN: 0.0000 mg | Freq: Two times a day (BID) | INTRAMUSCULAR | Status: DC

## 2021-07-25 MED ORDER — SODIUM CHLORIDE 0.9 % IV BOLUS
1000.0000 mL | Freq: Once | INTRAVENOUS | Status: AC
  Administered 2021-07-25: 1000 mL via INTRAVENOUS

## 2021-07-25 MED ORDER — THIAMINE HCL 100 MG/ML IJ SOLN: 100.0000 mg | Freq: Every day | INTRAMUSCULAR | Status: DC

## 2021-07-25 MED ORDER — HYPROMELLOSE (GONIOSCOPIC) 2.5 % OP SOLN: 1.0000 [drp] | Freq: Two times a day (BID) | OPHTHALMIC | Status: DC | PRN

## 2021-07-25 MED ORDER — PIPERACILLIN-TAZOBACTAM 3.375 G IVPB
3.3750 g | Freq: Three times a day (TID) | INTRAVENOUS | Status: DC
  Administered 2021-07-25 – 2021-07-28 (×10): 3.375 g via INTRAVENOUS
  Filled 2021-07-25 (×10): qty 50

## 2021-07-25 MED ORDER — MORPHINE SULFATE (PF) 4 MG/ML IV SOLN
4.0000 mg | Freq: Once | INTRAVENOUS | Status: AC
  Administered 2021-07-25: 4 mg via INTRAVENOUS
  Filled 2021-07-25: qty 1

## 2021-07-25 MED ORDER — IOHEXOL 9 MG/ML PO SOLN
ORAL | Status: AC
  Filled 2021-07-25: qty 1000

## 2021-07-25 MED ORDER — ORAL CARE MOUTH RINSE: 15.0000 mL | OROMUCOSAL | Status: DC | PRN

## 2021-07-25 MED ORDER — ONDANSETRON HCL 4 MG/2ML IJ SOLN
4.0000 mg | Freq: Once | INTRAMUSCULAR | Status: AC
  Administered 2021-07-25: 4 mg via INTRAVENOUS
  Filled 2021-07-25: qty 2

## 2021-07-25 MED ORDER — LORAZEPAM 2 MG/ML IJ SOLN: 1.0000 mg | INTRAMUSCULAR | Status: AC | PRN

## 2021-07-25 MED ORDER — ONDANSETRON HCL 4 MG PO TABS: 4.0000 mg | ORAL_TABLET | Freq: Four times a day (QID) | ORAL | Status: DC | PRN

## 2021-07-25 MED ORDER — MORPHINE SULFATE (PF) 2 MG/ML IV SOLN
2.0000 mg | INTRAVENOUS | Status: DC | PRN
  Administered 2021-07-25 – 2021-07-26 (×2): 2 mg via INTRAVENOUS
  Filled 2021-07-25 (×2): qty 1

## 2021-07-25 MED ORDER — SODIUM CHLORIDE 0.9% FLUSH
3.0000 mL | Freq: Two times a day (BID) | INTRAVENOUS | Status: DC
  Administered 2021-07-25 – 2021-07-27 (×3): 3 mL via INTRAVENOUS

## 2021-07-25 MED ORDER — ALBUTEROL SULFATE (2.5 MG/3ML) 0.083% IN NEBU
2.5000 mg | INHALATION_SOLUTION | Freq: Four times a day (QID) | RESPIRATORY_TRACT | Status: DC | PRN
  Administered 2021-07-28: 2.5 mg via RESPIRATORY_TRACT
  Filled 2021-07-25: qty 3

## 2021-07-25 MED ORDER — ACETAMINOPHEN 325 MG PO TABS
650.0000 mg | ORAL_TABLET | Freq: Four times a day (QID) | ORAL | Status: DC | PRN
  Administered 2021-07-25 – 2021-07-28 (×3): 650 mg via ORAL
  Filled 2021-07-25 (×4): qty 2

## 2021-07-25 MED ORDER — PIPERACILLIN-TAZOBACTAM 3.375 G IVPB 30 MIN
3.3750 g | Freq: Once | INTRAVENOUS | Status: AC
  Administered 2021-07-25: 3.375 g via INTRAVENOUS
  Filled 2021-07-25: qty 50

## 2021-07-25 MED ORDER — LORAZEPAM 2 MG/ML IJ SOLN: 0.0000 mg | Freq: Four times a day (QID) | INTRAMUSCULAR | Status: AC

## 2021-07-26 LAB — CBC
HCT: 49.3 % (ref 39.0–52.0)
Hemoglobin: 16.2 g/dL (ref 13.0–17.0)
MCH: 32.1 pg (ref 26.0–34.0)
MCHC: 32.9 g/dL (ref 30.0–36.0)
MCV: 97.6 fL (ref 80.0–100.0)
Platelets: UNDETERMINED 10*3/uL (ref 150–400)
RBC: 5.05 MIL/uL (ref 4.22–5.81)
RDW: 12.6 % (ref 11.5–15.5)
WBC: 11 10*3/uL — ABNORMAL HIGH (ref 4.0–10.5)
nRBC: 0 % (ref 0.0–0.2)

## 2021-07-26 LAB — CEA: CEA: 7.1 ng/mL — ABNORMAL HIGH (ref 0.0–4.7)

## 2021-07-26 LAB — BASIC METABOLIC PANEL
Anion gap: 10 (ref 5–15)
BUN: 5 mg/dL — ABNORMAL LOW (ref 8–23)
CO2: 29 mmol/L (ref 22–32)
Calcium: 8.4 mg/dL — ABNORMAL LOW (ref 8.9–10.3)
Chloride: 99 mmol/L (ref 98–111)
Creatinine, Ser: 0.63 mg/dL (ref 0.61–1.24)
GFR, Estimated: >60 mL/min (ref >60)
Glucose, Bld: 126 mg/dL — ABNORMAL HIGH (ref 70–99)
Potassium: 3.7 mmol/L (ref 3.5–5.1)
Sodium: 138 mmol/L (ref 135–145)

## 2021-07-26 LAB — PHOSPHORUS: Phosphorus: 2.8 mg/dL (ref 2.5–4.6)

## 2021-07-26 LAB — HEMOGLOBIN A1C
Hgb A1c MFr Bld: 8 % — ABNORMAL HIGH (ref 4.8–5.6)
Mean Plasma Glucose: 183 mg/dL

## 2021-07-26 LAB — MAGNESIUM: Magnesium: 1.4 mg/dL — ABNORMAL LOW (ref 1.7–2.4)

## 2021-07-26 MED ORDER — KETOROLAC TROMETHAMINE 30 MG/ML IJ SOLN
30.0000 mg | Freq: Four times a day (QID) | INTRAMUSCULAR | Status: DC | PRN
Start: 2021-07-26 — End: 2021-07-29
  Administered 2021-07-26 – 2021-07-27 (×2): 30 mg via INTRAVENOUS
  Filled 2021-07-26 (×3): qty 1

## 2021-07-26 MED ORDER — METHYLPREDNISOLONE SODIUM SUCC 40 MG IJ SOLR
40.0000 mg | Freq: Once | INTRAMUSCULAR | Status: AC
  Administered 2021-07-26: 40 mg via INTRAVENOUS
  Filled 2021-07-26: qty 1

## 2021-07-26 MED ORDER — SPIRONOLACTONE 25 MG PO TABS
50.0000 mg | ORAL_TABLET | Freq: Every day | ORAL | Status: DC
Start: 2021-07-26 — End: 2021-07-29
  Administered 2021-07-26 – 2021-07-28 (×3): 50 mg via ORAL
  Filled 2021-07-26 (×3): qty 2

## 2021-07-26 MED ORDER — COLCHICINE 0.6 MG PO TABS
0.6000 mg | ORAL_TABLET | Freq: Two times a day (BID) | ORAL | Status: DC
  Administered 2021-07-26 – 2021-07-28 (×4): 0.6 mg via ORAL
  Filled 2021-07-26 (×5): qty 1

## 2021-07-26 MED ORDER — AMLODIPINE BESYLATE 10 MG PO TABS
10.0000 mg | ORAL_TABLET | Freq: Every day | ORAL | Status: DC
  Administered 2021-07-26 – 2021-07-28 (×3): 10 mg via ORAL
  Filled 2021-07-26 (×3): qty 1

## 2021-07-26 MED ORDER — COLCHICINE 0.6 MG PO TABS
0.6000 mg | ORAL_TABLET | Freq: Once | ORAL | Status: AC
  Administered 2021-07-26: 0.6 mg via ORAL
  Filled 2021-07-26: qty 1

## 2021-07-26 MED ORDER — MAGNESIUM SULFATE 4 GM/100ML IV SOLN
4.0000 g | Freq: Once | INTRAVENOUS | Status: AC
  Administered 2021-07-26: 4 g via INTRAVENOUS
  Filled 2021-07-26: qty 100

## 2021-07-26 NOTE — TOC Initial Note (Addendum)
Transition of Care [facility]) - Initial/Assessment Note    Patient Details  Name: Donald Bender MRN: [id number] Date of Birth: Oct 28, 1960  Transition of [facility]) CM/SW Contact:    Tom-Johnson, Renea Ee, RN Phone Number: 07/26/2021, 1:03 PM  Clinical Narrative:                  CM spoke with patient at bedside about needs for post hospital transition and Financial assistance.  Admitted for Diverticulitis of Large Intestine with Perforation. Sx following, no surgical intervention at this time. On IV abx. From home with significant other. Does not have children. Independent with care and drive self prior to admission. States he is unemployed and not on disability.  CM notified Financial Navigators to screen for Medicaid eligibility, they will screen and followup with patient.  PCP is Charlott Rakes, MD and uses [facility] pharmacy on [address]. MATCH done for prescription assistance.  CM will continue to follow with needs.   Barriers to Discharge: Continued Medical Work up   Patient Goals and CMS Choice Patient states their goals for this hospitalization and ongoing recovery are:: To return home CMS Medicare.gov Compare Post Acute Care list provided to:: Patient Choice offered to / list presented to : Patient  Expected Discharge Plan and Services   In-house Referral: Financial Counselor Discharge Planning Services: CM Consult, [place] [facility]   Living arrangements for the past 2 months: Mobile Home                                      Prior Living Arrangements/Services Living arrangements for the past 2 months: Mobile Home Lives with:: Significant Other Patient language and need for interpreter reviewed:: Yes Do you feel safe going back to the place where you live?: Yes      Need for Family Participation in Patient Care: Yes (Comment) Care giver support system in place?: Yes (comment) Current home services: DME (Cane, walker) Criminal Activity/Legal Involvement  Pertinent to Current Situation/Hospitalization: No - Comment as needed  Activities of Daily Living   ADL Screening (condition at time of admission) Patient's cognitive ability adequate to safely complete daily activities?: Yes Is the patient deaf or have difficulty hearing?: No Does the patient have difficulty seeing, even when wearing glasses/contacts?: No Does the patient have difficulty concentrating, remembering, or making decisions?: No Patient able to express need for assistance with ADLs?: Yes Does the patient have difficulty dressing or bathing?: No Independently performs ADLs?: Yes (appropriate for developmental age) Does the patient have difficulty walking or climbing stairs?: No Weakness of Legs: Both Weakness of Arms/Hands: Both  Permission Sought/Granted Permission sought to share information with : Case Manager, Customer service manager, Family Supports Permission granted to share information with : Yes, Verbal Permission Granted              Emotional Assessment Appearance:: Appears stated age Attitude/Demeanor/Rapport: Engaged, Gracious Affect (typically observed): Accepting, Appropriate, Calm, Hopeful Orientation: : Oriented to Self, Oriented to Place, Oriented to  Time, Oriented to Situation Alcohol / Substance Use: Not Applicable Psych Involvement: No (comment)  Admission diagnosis:  Diverticulitis of colon with perforation [K57.20] Diverticulitis of large intestine with perforation [K57.20] Diverticulitis of large intestine with perforation and abscess, unspecified bleeding status [K57.20] Patient Active Problem List   Diagnosis Date Noted   Diverticulitis of large intestine with perforation 07/25/2021   Hypertensive urgency 07/25/2021   SIRS (systemic inflammatory response syndrome) (  [place]) 07/25/2021   Leukocytosis 07/25/2021   Tobacco abuse 07/25/2021   Alcohol abuse 07/25/2021   Does not have health insurance 07/25/2021   Inguinal hernia  07/25/2021   Obesity (BMI 30-39.9) 07/25/2021   Acute respiratory failure with hypoxia ([place]) 07/25/2021   Polycythemia    Cellulitis 03/18/2020   Hypertriglyceridemia 07/06/2016   Gout 11/16/2015   Essential hypertension 09/11/2013   Type 2 diabetes mellitus without complication, without long-term current use of insulin ([place]) 09/11/2013   PCP:  Charlott Rakes, MD Pharmacy:   [facility] [place]. [address] [place] [address] Phone: [phone] Fax: [phone]  [place] ([place]), [place] - [address] [place] [address] Phone: [phone] Fax: [phone]     Social Determinants of Health (SDOH) Interventions    Readmission Risk Interventions     No data to display

## 2021-07-26 NOTE — Hospital Course (Signed)
61 y.o. male with medical history significant of hypertension, diabetes, tobacco who presents with complaints of 3-week history of progressively worsening right lower abdominal pain.  He complains of having constipation with difficulty passing stools.  Patient has intermittently had small ropelike stools that have been white and pale in color.  Associated symptoms include fatigue and generalized malaise.  Denies blood present in stools, nausea, vomiting, or weight loss.  He has not been on any medications due to financial concerns and lack of insurance which she has no primary care provider.  Admits to drinking 1/5 of liquor per day on average and smokes more than 2 packs of cigarettes.  He has never had a colonoscopy although it was previously mentioned.  His brother is sitting at bedside and notes that he has had history of diverticulitis with polyps removed that were reported to be cancerous or early cancers.  Denies any prior history of similar symptoms like this in the past.  Normally at baseline patient is not on oxygen.   On admission to the emergency department patient was noted to be afebrile with pulse 84-104, respirations 10-25, blood pressure elevated up to 212/109, and O2 saturations noted to be as low as 89% on room air.  Labs from yesterday significant for WBC 11.6 and hemoglobin 18.1.  Urinalysis did not show acute significant concern for infection.  CT scan of the abdomen and pelvis concerning for perforated diverticulitis, concern for phlegmon with possible abscess, and moderate direct right inguinal hernia containing multiple loops of bowel without evidence of obstruction..  No lactic acid was obtained.  General surgery has been consulted.  Patient has been given 1 L normal saline IV fluids, morphine IV for pain, Zofran, and started on Zosyn.

## 2021-07-26 NOTE — Progress Notes (Signed)
Progress Note   Patient: Donald Bender YBW:[id number] DOB: [birth year] DOA: 07/24/2021     1 DOS: the patient was seen and examined on 07/26/2021   Brief hospital course: 61 y.o. male with medical history significant of hypertension, diabetes, tobacco who presents with complaints of 3-week history of progressively worsening right lower abdominal pain.  He complains of having constipation with difficulty passing stools.  Patient has intermittently had small ropelike stools that have been white and pale in color.  Associated symptoms include fatigue and generalized malaise.  Denies blood present in stools, nausea, vomiting, or weight loss.  He has not been on any medications due to financial concerns and lack of insurance which she has no primary care provider.  Admits to drinking 1/5 of liquor per day on average and smokes more than 2 packs of cigarettes.  He has never had a colonoscopy although it was previously mentioned.  His brother is sitting at bedside and notes that he has had history of diverticulitis with polyps removed that were reported to be cancerous or early cancers.  Denies any prior history of similar symptoms like this in the past.  Normally at baseline patient is not on oxygen.   On admission to the emergency department patient was noted to be afebrile with pulse 84-104, respirations 10-25, blood pressure elevated up to 212/109, and O2 saturations noted to be as low as 89% on room air.  Labs from yesterday significant for WBC 11.6 and hemoglobin 18.1.  Urinalysis did not show acute significant concern for infection.  CT scan of the abdomen and pelvis concerning for perforated diverticulitis, concern for phlegmon with possible abscess, and moderate direct right inguinal hernia containing multiple loops of bowel without evidence of obstruction..  No lactic acid was obtained.  General surgery has been consulted.  Patient has been given 1 L normal saline IV fluids, morphine IV for pain, Zofran,  and started on Zosyn.  Assessment and Plan: Diverticulitis with perforation  Patient presented with complaints of lower abdominal pain found to have concern for diverticulitis with concern for perforation on CT imaging.   -General surgery consulted -CEA mildly elevated at 7.1 -Pt to follow up with GI as outpatient -Continue empiric antibiotics of Zosyn -continue analgesia as needed -Diet advanced to full liquid per Surgery -will benefit from colonoscopy in 4-6 weeks after resolution of diverticulitis   Acute respiratory failure with hypoxia At baseline patient is not on oxygen at home.  In the room patient's O2 saturation noted to be as low as 86% on room air with improvement on 3 L of nasal cannula oxygen currently.  Possibly related with pain and poor inspiratory effort. -Continue nasal cannula oxygen to maintain O2 saturation greater than 92% -Incentive spirometry -Chest xray reviewed. Unremarkable    SIRS leukocytosis Patient was noted to be tachycardic and tachypneic on admission meeting SIRS criteria.  WBC was elevated at 11.6.  No initial lactic acid or blood cultures have been obtained prior to the patient receiving antibiotics. -Cont to follow CBC trends   Hypertensive urgency Blood pressures initially elevated up to 212/109.    -continue hydralazine IV and labetalol IV as needed for elevated blood pressures -Will resume norvasc and spironolactone per home regimen -Avoid HCTZ given hx gout -Of note, pharmacy documentation notes pt has been noncompliant with home meds   Polycythemia On admission hemoglobin 18.4.  Possibly related with patient's history of tobacco abuse. -Recheck cbc in AM   Diabetes mellitus type 2, without use of  long-term insulin On admission glucose 154.  Last available hemoglobin A1c was 6.7 in 06/2020.  Patient had previously been on metformin, but has been without treatment for approximately a year. -Check hemoglobin A1c -Cont SSI coverage.    Tobacco abuse Patient reports smoking more than 2 packs of cigarettes per day on average -Nicotine patch offered -Continue to counsel on need of cessation of tobacco use   Alcohol abuse Patient reports drinking a fifth of alcohol per day on average. -CIWA protocols initiated with scheduled Ativan and as needed -Thiamine IV   Inguinal hernia Patient noted to have moderate right inguinal hernia with bowel present without signs of obstruction and a small fat-containing left inguinal hernia. -Recommend outpatient follow-up with general surgery   Fatigue -TSH 3.937   Obesity BMI 31.87 kg/m2 -LDL 121 with total cholesterol 186   Does not have health insurance -Transitions of care consulted  Acute Gout flare -Reports acute flare involving multiple joints. Suspect related to both dehydration as well as ETOH hx -cont hydration as tolerated -Improvement with one dose of colchicine this AM, however pt continues with marked joint pains -Will give one dose of solumedrol       Subjective: Complaining of multiple joint pains consistent with known history of gout  Physical Exam: Vitals:   07/25/21 2012 07/25/21 2014 07/26/21 0442 07/26/21 0909  BP:  (!) 177/86 (!) 180/93 (!) 166/107  Pulse:  91 92 90  Resp:  '18 16 17  '$ Temp: 98.3 F (36.8 C)  98.7 F (37.1 C) 98.3 F (36.8 C)  TempSrc: Oral  Oral Oral  SpO2:  90% 94% 92%  Weight:      Height:       General exam: Awake, laying in bed, in nad Respiratory system: Normal respiratory effort, no wheezing Cardiovascular system: regular rate, s1, s2 Gastrointestinal system: Soft, nondistended, positive BS Central nervous system: CN2-12 grossly intact, strength intact Extremities: Perfused, no clubbing Skin: Normal skin turgor, no notable skin lesions seen Psychiatry: Mood normal // no visual hallucinations   Data Reviewed:  Labs reviewed: Na 138, Cr 0.63, WBC 11.0  Family Communication: Pt in room, family at  bedside  Disposition: Status is: Inpatient Remains inpatient appropriate because: Severity of illness  Planned Discharge Destination: Home     Author: Marylu Lund, MD 07/26/2021 3:45 PM  For on call review [email or web address].

## 2021-07-26 NOTE — Progress Notes (Signed)
[facility] Progress Note     Subjective: CC:   c/o gout attack in R wrist and L ankle.  Reports lower abdominal pain is unchanged - no better, no worse. Denies fever, chills, nausea, or vomiting. + flatus. States his last BM was the day of admission in the morning. Tolerating CLD - states abdominal pain is unchanged by PO intake. Denies urinary sxs.  Objective: Vital signs in last 24 hours: Temp:  [98 F (36.7 C)-98.7 F (37.1 C)] 98.7 F (37.1 C) (06/28 0442) Pulse Rate:  [83-98] 92 (06/28 0442) Resp:  [15-26] 16 (06/28 0442) BP: (166-188)/(82-116) 180/93 (06/28 0442) SpO2:  [87 %-94 %] 94 % (06/28 0442) Weight:  [614 kg] 113 kg (06/27 1700) Last BM Date : 07/24/21  Intake/Output from previous day: 06/27 0701 - 06/28 0700 In: 2355.3 [P.O.:900; I.V.:1330.6; IV Piggyback:124.6] Out: 650 [Urine:650] Intake/Output this shift: No intake/output data recorded.  PE: Gen:  Alert, NAD, cooperative Card:  Regular rate and rhythm Pulm:  Normal effort on nasal cannula Abd: Soft, LLQ and suprapubic tenderness (mostly in LLQ) without peritonitis, no organomegaly, no hernias. Skin: warm and dry, no rashes  Psych: A&Ox3   Lab Results:  Recent Labs    07/24/21 1929 07/26/21 0353  WBC 11.6* 11.0*  HGB 18.1* 16.2  HCT 56.5* 49.3  PLT 148* PLATELET CLUMPS NOTED ON SMEAR, UNABLE TO ESTIMATE   BMET Recent Labs    07/24/21 1929 07/26/21 0353  NA 138 138  K 3.9 3.7  CL 99 99  CO2 29 29  GLUCOSE 157* 126*  BUN 12 5*  CREATININE 0.58* 0.63  CALCIUM 9.0 8.4*   PT/INR No results for input(s): "LABPROT", "INR" in the last 72 hours. CMP     Component Value Date/Time   NA 138 07/26/2021 0353   NA 137 06/02/2020 1450   K 3.7 07/26/2021 0353   CL 99 07/26/2021 0353   CO2 29 07/26/2021 0353   GLUCOSE 126 (H) 07/26/2021 0353   BUN 5 (L) 07/26/2021 0353   BUN 48 (H) 06/02/2020 1450   CREATININE 0.63 07/26/2021 0353   CREATININE 0.64 (L) 11/21/2015 0915    CALCIUM 8.4 (L) 07/26/2021 0353   PROT 6.4 (L) 07/25/2021 1841   PROT 7.3 06/02/2020 1450   ALBUMIN 2.3 (L) 07/25/2021 1841   ALBUMIN 4.3 06/02/2020 1450   AST 12 (L) 07/25/2021 1841   ALT 11 07/25/2021 1841   ALKPHOS 50 07/25/2021 1841   BILITOT 0.9 07/25/2021 1841   BILITOT 0.9 06/02/2020 1450   GFRNONAA >60 07/26/2021 0353   GFRNONAA >89 11/21/2015 0915   GFRAA 136 01/24/2017 1512   GFRAA >89 11/21/2015 0915   Lipase     Component Value Date/Time   LIPASE 49 07/24/2021 1929       Studies/Results: DG CHEST PORT 1 VIEW  Result Date: 07/25/2021 CLINICAL DATA:  Hypoxia EXAM: PORTABLE CHEST 1 VIEW COMPARISON:  03/18/2020 FINDINGS: Transverse diameter of heart is increased. Low position of diaphragms may suggest COPD. Central pulmonary vessels are prominent without signs of alveolar pulmonary edema. There are no new focal infiltrates. There is no significant pleural effusion or pneumothorax. IMPRESSION: Central pulmonary vessels are prominent without signs of alveolar pulmonary edema. There is no focal pulmonary consolidation. Electronically Signed   By: Elmer Picker M.D.   On: 07/25/2021 15:30   CT Abdomen Pelvis W Contrast  Result Date: 07/25/2021 CLINICAL DATA:  Right lower quadrant abdominal pain EXAM: CT ABDOMEN AND PELVIS WITH CONTRAST TECHNIQUE: Multidetector  CT imaging of the abdomen and pelvis was performed using the standard protocol following bolus administration of intravenous contrast. RADIATION DOSE REDUCTION: This exam was performed according to the departmental dose-optimization program which includes automated exposure control, adjustment of the mA and/or kV according to patient size and/or use of iterative reconstruction technique. CONTRAST:  154m OMNIPAQUE IOHEXOL 300 MG/ML  SOLN COMPARISON:  None Available. FINDINGS: Lower chest: Visualized lung bases are clear. Moderate coronary artery calcification. Global cardiac size within normal limits. Hepatobiliary:  Mild hepatomegaly. No enhancing intrahepatic mass. No intra or extrahepatic biliary ductal dilation. Gallbladder unremarkable. Pancreas: Unremarkable Spleen: Unremarkable Adrenals/Urinary Tract: The adrenal glands are unremarkable. The kidneys are normal. Extensive inflammatory changes within the pelvis are intimately associated with the dome of the bladder which demonstrates asymmetric wall thickening in this region. No vesicular fistula, however, is clearly identified. The bladder is not distended. The anterior aspect of the bladder approaches the direct left inguinal hernia defect, best appreciated on sagittal image # 74/7. Stomach/Bowel: There is severe descending and sigmoid colonic diverticulosis. There is superimposed severe circumferential bowel wall thickening involving the sigmoid colon with extensive pericolonic inflammatory stranding. There is layering inflammatory fluid along the left pelvic sidewall with an ill-defined region of low attenuation centrally within this area, best seen on sagittal image # 71/7 and axial image # 71/3 likely representing a focus of phlegmon. A discrete drainable fluid collection, however, is not identified at this time. There is, however, a rim enhancing 2.4 cm fluid collection identified within the deep pelvis anterior to the mid rectum best seen on axial image # 80/3. The findings are, altogether, most in keeping with changes of perforated diverticulitis, however, an infiltrative malignancy is not completely excluded. Shotty adenopathy within the sigmoid mesentery is likely reactive in nature. Multiple loops of small bowel are seen within a moderate direct right inguinal hernia sac. Stomach, small bowel, and large bowel are otherwise unremarkable. No evidence of obstruction. No free intraperitoneal gas. Appendix normal. Vascular/Lymphatic: Moderate aortoiliac atherosclerotic calcification. No aortic aneurysm. No pathologic adenopathy within the abdomen and pelvis.  Reproductive: Prostate is unremarkable. Other: Small left and moderate right inguinal hernias are present. Rectum unremarkable. Musculoskeletal: Degenerative changes are seen within the lumbar spine. No acute bone abnormality. IMPRESSION: 1. Severe descending and sigmoid colonic diverticulosis with superimposed severe circumferential bowel wall thickening involving the sigmoid colon and extensive pericolonic inflammatory stranding. Findings are most in keeping with changes of perforated diverticulitis, however, an infiltrative malignancy is not completely excluded. Correlation with endoscopy once the patient's acute issues have resolved is recommended. 2. Extensive inflammatory change along the left pelvic sidewall with small focus of central phlegmon. More well-defined 2.3 cm rim enhancing fluid collection within the deep pelvis anterior to the mid rectum. 3. Extensive inflammatory changes within the pelvis are intimately associated with the dome of the bladder which demonstrates asymmetric wall thickening. No vesicular fistula is clearly identified. 4. Moderate direct right inguinal hernia containing multiple loops of small bowel without evidence of obstruction. Small fat containing left inguinal hernia marginally contains the anterior bladder wall. 5. Mild hepatomegaly. 6. Moderate coronary artery calcification. Electronically Signed   By: AFidela SalisburyM.D.   On: 07/25/2021 03:22    Anti-infectives: Anti-infectives (From admission, onward)    Start     Dose/Rate Route Frequency Ordered Stop   07/25/21 1000  piperacillin-tazobactam (ZOSYN) IVPB 3.375 g        3.375 g 12.5 mL/hr over 240 Minutes Intravenous Every 8 hours 07/25/21 0554  07/25/21 0415  piperacillin-tazobactam (ZOSYN) IVPB 3.375 g        3.375 g 100 mL/hr over 30 Minutes Intravenous  Once 07/25/21 0404 07/25/21 0449        Assessment/Plan 61 y/o M who presented with 3 weeks of progressive lower abdominal pain, decreased caliber  stools, and intermittent acholic stools. CT w/ inflammatory changes around the sigmoid colon and small phlegmon/abscess. DDx: sigmoid diverticulitis with abscess vs perforated colon mass - afebrile, WBC 11.0 from 11.6  - clinically he has persistent pain, no better but no worse - allow FLD and continue empiric IV abx for diverticulitis, he will likely need repeat CT scan in 24-48 hours to re-assess abscess and see if he is a candidate for a perc drain. Hopefully we can get him through this with non-operative management followed by interval colonoscopy in about 6 weeks. If he fails to improve or clinically worsens he will likely need a hartmann procedure. No emergent surgical needs today.    LOS: 1 day   I reviewed nursing notes, hospitalist notes, last 24 h vitals and pain scores, last 48 h intake and output, last 24 h labs and trends, and last 24 h imaging results.   Obie Dredge, PA-C [facility] Please see Amion for pager number during day hours 7:00am-4:30pm

## 2021-07-26 NOTE — TOC CAGE-AID Note (Signed)
Transition of Care [facility]) - CAGE-AID Screening   Patient Details  Name: Donald Bender MRN: [id number] Date of Birth: 1960-12-12  [facility]) CM/SW Contact:    Milinda Antis, [place] Phone Number: 07/26/2021, 10:27 AM   Clinical Narrative: CSW received consult for SA assessment and met with patient at bedside.  Patient accepted SA resources.   CAGE-AID Screening:    Have You Ever Felt You Ought to Cut Down on Your Drinking or Drug Use?: Yes Have People Annoyed You By Critizing Your Drinking Or Drug Use?: No Have You Felt Bad Or Guilty About Your Drinking Or Drug Use?: No Have You Ever Had a Drink or Used Drugs First Thing In The Morning to Steady Your Nerves or to Get Rid of a Hangover?: No CAGE-AID Score: 1  Substance Abuse Education Offered: Yes  Substance abuse interventions: Scientist, clinical (histocompatibility and immunogenetics)

## 2021-07-27 LAB — CBC
HCT: 51 % (ref 39.0–52.0)
Hemoglobin: 16.7 g/dL (ref 13.0–17.0)
MCH: 31.6 pg (ref 26.0–34.0)
MCHC: 32.7 g/dL (ref 30.0–36.0)
MCV: 96.6 fL (ref 80.0–100.0)
Platelets: 144 10*3/uL — ABNORMAL LOW (ref 150–400)
RBC: 5.28 MIL/uL (ref 4.22–5.81)
RDW: 12.2 % (ref 11.5–15.5)
WBC: 9.7 10*3/uL (ref 4.0–10.5)
nRBC: 0 % (ref 0.0–0.2)

## 2021-07-27 LAB — COMPREHENSIVE METABOLIC PANEL
ALT: 9 U/L (ref 0–44)
AST: 10 U/L — ABNORMAL LOW (ref 15–41)
Albumin: 2.1 g/dL — ABNORMAL LOW (ref 3.5–5.0)
Alkaline Phosphatase: 47 U/L (ref 38–126)
Anion gap: 10 (ref 5–15)
BUN: 7 mg/dL — ABNORMAL LOW (ref 8–23)
CO2: 26 mmol/L (ref 22–32)
Calcium: 8.6 mg/dL — ABNORMAL LOW (ref 8.9–10.3)
Chloride: 99 mmol/L (ref 98–111)
Creatinine, Ser: 0.59 mg/dL — ABNORMAL LOW (ref 0.61–1.24)
GFR, Estimated: >60 mL/min (ref >60)
Glucose, Bld: 165 mg/dL — ABNORMAL HIGH (ref 70–99)
Potassium: 3.8 mmol/L (ref 3.5–5.1)
Sodium: 135 mmol/L (ref 135–145)
Total Bilirubin: 1.2 mg/dL (ref 0.3–1.2)
Total Protein: 6.4 g/dL — ABNORMAL LOW (ref 6.5–8.1)

## 2021-07-27 LAB — MAGNESIUM: Magnesium: 2 mg/dL (ref 1.7–2.4)

## 2021-07-27 MED ORDER — BOOST / RESOURCE BREEZE PO LIQD CUSTOM
1.0000 | Freq: Three times a day (TID) | ORAL | Status: DC
  Administered 2021-07-27 – 2021-07-28 (×5): 1 via ORAL

## 2021-07-27 NOTE — Plan of Care (Signed)
  Problem: Education: Goal: Knowledge of General Education information will improve Description Including pain rating scale, medication(s)/side effects and non-pharmacologic comfort measures Outcome: Progressing   

## 2021-07-27 NOTE — Progress Notes (Signed)
Patient ID: Donald Bender, male   DOB: 03-13-1960, 61 y.o.   MRN: [id number] [facility] Surgery Progress Note     Subjective: CC-  Gout flare feeling much better. Lower abdominal pain overall a little better from prior to admission but essentially the same. Denies n/v. Tolerating diet without worsening pain. Passing flatus, no BM. WBC normalized 9.7, afebrile  Objective: Vital signs in last 24 hours: Temp:  [97.8 F (36.6 C)-98.5 F (36.9 C)] 97.8 F (36.6 C) (06/29 0349) Pulse Rate:  [90-103] 100 (06/29 0349) Resp:  [17-19] 18 (06/29 0349) BP: (149-191)/(80-107) 164/101 (06/29 0349) SpO2:  [90 %-96 %] 95 % (06/29 0349) Last BM Date : 07/24/21  Intake/Output from previous day: 06/28 0701 - 06/29 0700 In: 2743.6 [P.O.:800; I.V.:1642.6; IV Piggyback:301] Out: 1950 [[id number]] Intake/Output this shift: No intake/output data recorded.  PE: Gen:  Alert, NAD Abd: Soft, ND, mild subjective LLQ and suprapubic tenderness without peritonitis  Lab Results:  Recent Labs    07/26/21 0353 07/27/21 0328  WBC 11.0* 9.7  HGB 16.2 16.7  HCT 49.3 51.0  PLT PLATELET CLUMPS NOTED ON SMEAR, UNABLE TO ESTIMATE 144*   BMET Recent Labs    07/26/21 0353 07/27/21 0328  NA 138 135  K 3.7 3.8  CL 99 99  CO2 29 26  GLUCOSE 126* 165*  BUN 5* 7*  CREATININE 0.63 0.59*  CALCIUM 8.4* 8.6*   PT/INR No results for input(s): "LABPROT", "INR" in the last 72 hours. CMP     Component Value Date/Time   NA 135 07/27/2021 0328   NA 137 06/02/2020 1450   K 3.8 07/27/2021 0328   CL 99 07/27/2021 0328   CO2 26 07/27/2021 0328   GLUCOSE 165 (H) 07/27/2021 0328   BUN 7 (L) 07/27/2021 0328   BUN 48 (H) 06/02/2020 1450   CREATININE 0.59 (L) 07/27/2021 0328   CREATININE 0.64 (L) 11/21/2015 0915   CALCIUM 8.6 (L) 07/27/2021 0328   PROT 6.4 (L) 07/27/2021 0328   PROT 7.3 06/02/2020 1450   ALBUMIN 2.1 (L) 07/27/2021 0328   ALBUMIN 4.3 06/02/2020 1450   AST 10 (L) 07/27/2021 0328   ALT  9 07/27/2021 0328   ALKPHOS 47 07/27/2021 0328   BILITOT 1.2 07/27/2021 0328   BILITOT 0.9 06/02/2020 1450   GFRNONAA >60 07/27/2021 0328   GFRNONAA >89 11/21/2015 0915   GFRAA 136 01/24/2017 1512   GFRAA >89 11/21/2015 0915   Lipase     Component Value Date/Time   LIPASE 49 07/24/2021 1929       Studies/Results: DG CHEST PORT 1 VIEW  Result Date: 07/25/2021 CLINICAL DATA:  Hypoxia EXAM: PORTABLE CHEST 1 VIEW COMPARISON:  03/18/2020 FINDINGS: Transverse diameter of heart is increased. Low position of diaphragms may suggest COPD. Central pulmonary vessels are prominent without signs of alveolar pulmonary edema. There are no new focal infiltrates. There is no significant pleural effusion or pneumothorax. IMPRESSION: Central pulmonary vessels are prominent without signs of alveolar pulmonary edema. There is no focal pulmonary consolidation. Electronically Signed   By: Elmer Picker M.D.   On: 07/25/2021 15:30    Anti-infectives: Anti-infectives (From admission, onward)    Start     Dose/Rate Route Frequency Ordered Stop   07/25/21 1000  piperacillin-tazobactam (ZOSYN) IVPB 3.375 g        3.375 g 12.5 mL/hr over 240 Minutes Intravenous Every 8 hours 07/25/21 0554     07/25/21 0415  piperacillin-tazobactam (ZOSYN) IVPB 3.375 g  3.375 g 100 mL/hr over 30 Minutes Intravenous  Once 07/25/21 0404 07/25/21 0449        Assessment/Plan 61 y/o M who presented with 3 weeks of progressive lower abdominal pain, decreased caliber stools, and intermittent acholic stools. CT w/ inflammatory changes around the sigmoid colon and small phlegmon/abscess. DDx: sigmoid diverticulitis with abscess vs perforated colon mass - afebrile, WBC 9.7 from 11.0 - clinically still has some lower abdominal pain but is minimally tender on exam - advance to soft diet, add Boost. Continue empiric IV abx for diverticulitis. Consider repeat CT scan if symptoms to do not resolve to see if abscess has  become amenable to drainage  Hopefully we can get him through this with non-operative management followed by interval colonoscopy in about 6 weeks. If he fails to improve or clinically worsens he will likely need a hartmann procedure. No emergent surgical needs today.  ID - zosyn FEN - soft diet, Boost VTE - SCDs, lovnoex Foley - none    LOS: 1 day    I reviewed nursing notes, hospitalist notes, last 24 h vitals and pain scores, last 48 h intake and output, last 24 h labs and trends, and last 24 h imaging results.    LOS: 2 days    [facility] 07/27/2021, 9:03 AM Please see Amion for pager number during day hours 7:00am-4:30pm

## 2021-07-27 NOTE — Progress Notes (Signed)
Progress Note   Patient: Donald Bender QAS:[id number] DOB: 1960/12/07 DOA: 07/24/2021     2 DOS: the patient was seen and examined on 07/27/2021   Brief hospital course: 61 y.o. male with medical history significant of hypertension, diabetes, tobacco who presents with complaints of 3-week history of progressively worsening right lower abdominal pain.  He complains of having constipation with difficulty passing stools.  Patient has intermittently had small ropelike stools that have been white and pale in color.  Associated symptoms include fatigue and generalized malaise.  Denies blood present in stools, nausea, vomiting, or weight loss.  He has not been on any medications due to financial concerns and lack of insurance which she has no primary care provider.  Admits to drinking 1/5 of liquor per day on average and smokes more than 2 packs of cigarettes.  He has never had a colonoscopy although it was previously mentioned.  His brother is sitting at bedside and notes that he has had history of diverticulitis with polyps removed that were reported to be cancerous or early cancers.  Denies any prior history of similar symptoms like this in the past.  Normally at baseline patient is not on oxygen.   On admission to the emergency department patient was noted to be afebrile with pulse 84-104, respirations 10-25, blood pressure elevated up to 212/109, and O2 saturations noted to be as low as 89% on room air.  Labs from yesterday significant for WBC 11.6 and hemoglobin 18.1.  Urinalysis did not show acute significant concern for infection.  CT scan of the abdomen and pelvis concerning for perforated diverticulitis, concern for phlegmon with possible abscess, and moderate direct right inguinal hernia containing multiple loops of bowel without evidence of obstruction..  No lactic acid was obtained.  General surgery has been consulted.  Patient has been given 1 L normal saline IV fluids, morphine IV for pain, Zofran,  and started on Zosyn.  Assessment and Plan: Diverticulitis with perforation  Patient presented with complaints of lower abdominal pain found to have concern for diverticulitis with concern for perforation on CT imaging.   -General surgery consulted -CEA mildly elevated at 7.1 -Pt to follow up with GI as outpatient -Continue empiric antibiotics of Zosyn -continue analgesia as needed -Diet advanced to soft -Per surgery, no emergent need for surgery today, however if he fails to improve or worsen, then may need hartmann procedure -will benefit from colonoscopy in 4-6 weeks after resolution of diverticulitis   Acute respiratory failure with hypoxia At baseline patient is not on oxygen at home.  In the room patient's O2 saturation noted to be as low as 86% on room air with improvement on 3 L of nasal cannula oxygen currently.  Possibly related with pain and poor inspiratory effort. -Continue nasal cannula oxygen to maintain O2 saturation greater than 92% -Incentive spirometry -Recent chest xray reviewed. Unremarkable    SIRS leukocytosis Patient was noted to be tachycardic and tachypneic on admission meeting SIRS criteria.  WBC was elevated at 11.6.  No initial lactic acid or blood cultures have been obtained prior to the patient receiving antibiotics. -Cont to follow CBC trends   Hypertensive urgency Blood pressures initially elevated up to 212/109.    -continue hydralazine IV and labetalol IV as needed for elevated blood pressures -Will resume norvasc and spironolactone per home regimen -Avoid HCTZ given hx gout -Of note, pharmacy documentation notes pt has been noncompliant with home meds -cont to titrate bp meds as needed   Polycythemia  On admission hemoglobin 18.4.  Possibly related with patient's history of tobacco abuse. -Recheck cbc in AM   Diabetes mellitus type 2, without use of long-term insulin On admission glucose 154.  Last available hemoglobin A1c was 6.7 in 06/2020.   Patient had previously been on metformin, but has been without treatment for approximately a year. -A1c 8.0 -Cont SSI coverage.   Tobacco abuse Patient reports smoking more than 2 packs of cigarettes per day on average -Nicotine patch offered -Continue to counsel on need of cessation of tobacco use   Alcohol abuse Patient reports drinking a fifth of alcohol per day on average. -CIWA protocols initiated with scheduled Ativan and as needed -Thiamine IV -Cessation done at bedside   Inguinal hernia Patient noted to have moderate right inguinal hernia with bowel present without signs of obstruction and a small fat-containing left inguinal hernia. -Recommend outpatient follow-up with general surgery   Fatigue -TSH 3.937   Obesity BMI 31.87 kg/m2 -LDL 121 with total cholesterol 186   Does not have health insurance -Transitions of care consulted  Acute Gout flare -resolved with toradol and one dose of steroids 6/28       Subjective:  States joint pains now resolved since receiving toradol, colchicine and dose of steroids yesterday  Physical Exam: Vitals:   07/26/21 2114 07/27/21 0349 07/27/21 0916 07/27/21 1648  BP: (!) 191/100 (!) 164/101 (!) 170/95 (!) 168/94  Pulse: (!) 103 100 91 88  Resp: '19 18 18 17  '$ Temp: 98.5 F (36.9 C) 97.8 F (36.6 C)  98.3 F (36.8 C)  TempSrc: Oral Oral    SpO2: 90% 95% 92% 94%  Weight:      Height:       General exam: Conversant, in no acute distress Respiratory system: normal chest rise, clear, no audible wheezing Cardiovascular system: regular rhythm, s1-s2 Gastrointestinal system: Nondistended, nontender, pos BS Central nervous system: No seizures, no tremors Extremities: No cyanosis, no joint deformities Skin: No rashes, no pallor Psychiatry: Affect normal // no auditory hallucinations   Data Reviewed:  Labs reviewed: Na 135, Cr 0.59, WBC 9.7  Family Communication: Pt in room, family not at bedside  Disposition: Status  is: Inpatient Remains inpatient appropriate because: Severity of illness  Planned Discharge Destination: Home     Author: Marylu Lund, MD 07/27/2021 5:22 PM  For on call review [email or web address].

## 2021-07-28 ENCOUNTER — Other Ambulatory Visit (HOSPITAL_COMMUNITY): Payer: Self-pay

## 2021-07-28 LAB — CBC
HCT: 47.3 % (ref 39.0–52.0)
Hemoglobin: 15.3 g/dL (ref 13.0–17.0)
MCH: 31.5 pg (ref 26.0–34.0)
MCHC: 32.3 g/dL (ref 30.0–36.0)
MCV: 97.3 fL (ref 80.0–100.0)
Platelets: 135 10*3/uL — ABNORMAL LOW (ref 150–400)
RBC: 4.86 MIL/uL (ref 4.22–5.81)
RDW: 12.3 % (ref 11.5–15.5)
WBC: 9.1 10*3/uL (ref 4.0–10.5)
nRBC: 0 % (ref 0.0–0.2)

## 2021-07-28 LAB — COMPREHENSIVE METABOLIC PANEL
ALT: 10 U/L (ref 0–44)
AST: 12 U/L — ABNORMAL LOW (ref 15–41)
Albumin: 2.1 g/dL — ABNORMAL LOW (ref 3.5–5.0)
Alkaline Phosphatase: 41 U/L (ref 38–126)
Anion gap: 8 (ref 5–15)
BUN: 10 mg/dL (ref 8–23)
CO2: 28 mmol/L (ref 22–32)
Calcium: 8.4 mg/dL — ABNORMAL LOW (ref 8.9–10.3)
Chloride: 103 mmol/L (ref 98–111)
Creatinine, Ser: 0.66 mg/dL (ref 0.61–1.24)
GFR, Estimated: >60 mL/min (ref >60)
Glucose, Bld: 157 mg/dL — ABNORMAL HIGH (ref 70–99)
Potassium: 3.4 mmol/L — ABNORMAL LOW (ref 3.5–5.1)
Sodium: 139 mmol/L (ref 135–145)
Total Bilirubin: 0.6 mg/dL (ref 0.3–1.2)
Total Protein: 6.1 g/dL — ABNORMAL LOW (ref 6.5–8.1)

## 2021-07-28 MED ORDER — HYDRALAZINE HCL 25 MG PO TABS
25.0000 mg | ORAL_TABLET | Freq: Three times a day (TID) | ORAL | Status: DC
  Administered 2021-07-28: 25 mg via ORAL
  Filled 2021-07-28: qty 1

## 2021-07-28 MED ORDER — AMOXICILLIN-POT CLAVULANATE 875-125 MG PO TABS
1.0000 | ORAL_TABLET | Freq: Two times a day (BID) | ORAL | 0 refills | Status: AC
  Filled 2021-07-28: qty 22, 11d supply, fill #0

## 2021-07-28 MED ORDER — LOSARTAN POTASSIUM 50 MG PO TABS
100.0000 mg | ORAL_TABLET | Freq: Every day | ORAL | Status: DC
  Administered 2021-07-28: 100 mg via ORAL
  Filled 2021-07-28: qty 2

## 2021-07-28 MED ORDER — POLYETHYLENE GLYCOL 3350 17 G PO PACK
17.0000 g | PACK | Freq: Every day | ORAL | Status: DC
  Administered 2021-07-28: 17 g via ORAL
  Filled 2021-07-28: qty 1

## 2021-07-28 MED ORDER — HYDRALAZINE HCL 25 MG PO TABS
25.0000 mg | ORAL_TABLET | Freq: Three times a day (TID) | ORAL | 0 refills | Status: DC
  Filled 2021-07-28: qty 90, 30d supply, fill #0

## 2021-07-28 MED ORDER — LOSARTAN POTASSIUM 100 MG PO TABS
100.0000 mg | ORAL_TABLET | Freq: Every day | ORAL | 0 refills | Status: DC
  Filled 2021-07-28: qty 30, 30d supply, fill #0

## 2021-07-28 MED ORDER — POTASSIUM CHLORIDE CRYS ER 20 MEQ PO TBCR
60.0000 meq | EXTENDED_RELEASE_TABLET | Freq: Once | ORAL | Status: AC
  Administered 2021-07-28: 60 meq via ORAL
  Filled 2021-07-28: qty 3

## 2021-07-28 NOTE — Discharge Summary (Signed)
Physician Discharge Summary   Patient: Donald Bender MRN: [id number] DOB: 1960-02-20  Admit date:     07/24/2021  Discharge date: 07/28/21  Discharge Physician: Marylu Lund   PCP: Charlott Rakes, MD   Recommendations at discharge:    F/u with PCP in 1-2 weeks Follow up with GI as scheduled, will need outpt colonoscopy  Discharge Diagnoses: Principal Problem:   Diverticulitis of large intestine with perforation Active Problems:   SIRS (systemic inflammatory response syndrome) ([place])   Leukocytosis   Hypertensive urgency   Polycythemia   Type 2 diabetes mellitus without complication, without long-term current use of insulin ([place])   Tobacco abuse   Alcohol abuse   Inguinal hernia   Does not have health insurance   Obesity (BMI 30-39.9)   Acute respiratory failure with hypoxia (HCC)  Resolved Problems:   * No resolved hospital problems. *  Hospital Course: 61 y.o. male with medical history significant of hypertension, diabetes, tobacco who presents with complaints of 3-week history of progressively worsening right lower abdominal pain.  He complains of having constipation with difficulty passing stools.  Patient has intermittently had small ropelike stools that have been white and pale in color.  Associated symptoms include fatigue and generalized malaise.  Denies blood present in stools, nausea, vomiting, or weight loss.  He has not been on any medications due to financial concerns and lack of insurance which she has no primary care provider.  Admits to drinking 1/5 of liquor per day on average and smokes more than 2 packs of cigarettes.  He has never had a colonoscopy although it was previously mentioned.  His brother is sitting at bedside and notes that he has had history of diverticulitis with polyps removed that were reported to be cancerous or early cancers.  Denies any prior history of similar symptoms like this in the past.  Normally at baseline patient is not on oxygen.   On  admission to the emergency department patient was noted to be afebrile with pulse 84-104, respirations 10-25, blood pressure elevated up to 212/109, and O2 saturations noted to be as low as 89% on room air.  Labs from yesterday significant for WBC 11.6 and hemoglobin 18.1.  Urinalysis did not show acute significant concern for infection.  CT scan of the abdomen and pelvis concerning for perforated diverticulitis, concern for phlegmon with possible abscess, and moderate direct right inguinal hernia containing multiple loops of bowel without evidence of obstruction..  No lactic acid was obtained.  General surgery has been consulted.  Patient has been given 1 L normal saline IV fluids, morphine IV for pain, Zofran, and started on Zosyn.  Assessment and Plan: Diverticulitis with perforation  Patient presented with complaints of lower abdominal pain found to have concern for diverticulitis with concern for perforation on CT imaging.   -General surgery consulted -CEA mildly elevated at 7.1 -Pt to follow up with GI as outpatient -Complete course of augmentin for total 14 days of abx -Diet advanced to soft -Per surgery, no emergent need for surgery today, however if he fails to improve or worsen, then may need hartmann procedure -will benefit from colonoscopy in 4-6 weeks after resolution of diverticulitis   Acute respiratory failure with hypoxia At baseline patient is not on oxygen at home.  In the room patient's O2 saturation noted to be as low as 86% on room air with improvement on 3 L of nasal cannula oxygen currently.  Possibly related with pain and poor inspiratory effort. -Continue nasal  cannula oxygen to maintain O2 saturation greater than 92% -Recent chest xray reviewed. Unremarkable    SIRS leukocytosis Patient was noted to be tachycardic and tachypneic on admission meeting SIRS criteria.  WBC was elevated at 11.6.  No initial lactic acid or blood cultures have been obtained prior to the  patient receiving antibiotics. WBC normalized   Hypertensive urgency Blood pressures initially elevated up to 212/109.    -continue hydralazine IV and labetalol IV as needed for elevated blood pressures -Resumed norvasc and spironolactone per home regimen -Avoid HCTZ given hx gout -Of note, pharmacy documentation notes pt has been noncompliant with home meds -Have added hydralazine '25mg'$  q8h -Recommend pt closely follow up with PCP in one week -Have strongly advised compliance with medications   Polycythemia On admission hemoglobin 18.4.  Possibly related with patient's history of tobacco abuse.   Diabetes mellitus type 2, without use of long-term insulin On admission glucose 154.  Last available hemoglobin A1c was 6.7 in 06/2020.  Patient had previously been on metformin, but has been without treatment for approximately a year. -A1c 8.0 -Cont SSI coverage.   Tobacco abuse Patient reports smoking more than 2 packs of cigarettes per day on average -Nicotine patch offered -Continue to counsel on need of cessation of tobacco use   Alcohol abuse Patient reports drinking a fifth of alcohol per day on average. -CIWA protocols initiated with scheduled Ativan and as needed -Thiamine IV -Cessation done at bedside   Inguinal hernia Patient noted to have moderate right inguinal hernia with bowel present without signs of obstruction and a small fat-containing left inguinal hernia. -Recommend outpatient follow-up with general surgery   Fatigue -TSH 3.937   Obesity BMI 31.87 kg/m2 -LDL 121 with total cholesterol 186   Does not have health insurance -Transitions of care consulted   Acute Gout flare -resolved with toradol and one dose of steroids 6/28        Consultants: General Surgery, GI Procedures performed:   Disposition: Home Diet recommendation:  Carb modified diet DISCHARGE MEDICATION: Allergies as of 07/28/2021   No Known Allergies      Medication List      STOP taking these medications    losartan-hydrochlorothiazide 100-25 MG tablet Commonly known as: HYZAAR       TAKE these medications    amLODipine 10 MG tablet Commonly known as: NORVASC Take 1 tablet (10 mg total) by mouth daily.   amoxicillin-clavulanate 875-125 MG tablet Commonly known as: AUGMENTIN Take 1 tablet by mouth 2 (two) times daily for 11 days.   aspirin EC 81 MG tablet Take 1 tablet (81 mg total) by mouth daily.   atorvastatin 40 MG tablet Commonly known as: LIPITOR Take 1 tablet (40 mg total) by mouth daily. For cholesterol   colchicine 0.6 MG tablet TAKE 2 TABLETS BY MOUTH AT THE ONSET OF A GOUT ATTACK, MAY REPEAT 1 TABLET IN 2 HOURS IF SYMPTOMS PERSIST.   hydrALAZINE 25 MG tablet Commonly known as: APRESOLINE Take 1 tablet (25 mg total) by mouth every 8 (eight) hours.   hydroxypropyl methylcellulose / hypromellose 2.5 % ophthalmic solution Commonly known as: ISOPTO TEARS / GONIOVISC Place 1 drop into both eyes 2 (two) times daily as needed for dry eyes.   ibuprofen 200 MG tablet Commonly known as: ADVIL Take 400 mg by mouth every 6 (six) hours as needed for headache or moderate pain.   losartan 100 MG tablet Commonly known as: COZAAR Take 1 tablet (100 mg total) by mouth daily.  Start taking on: July 29, 2021   metFORMIN 500 MG 24 hr tablet Commonly known as: GLUCOPHAGE-XR Take 1 tablet by mouth once daily with breakfast   naproxen sodium 220 MG tablet Commonly known as: ALEVE Take 440 mg by mouth 2 (two) times daily as needed (pain).   nicotine 21 mg/24hr patch Commonly known as: NICODERM CQ - dosed in mg/24 hours Place 1 patch (21 mg total) onto the skin daily.   spironolactone 50 MG tablet Commonly known as: ALDACTONE Take 1 tablet (50 mg total) by mouth daily.        Follow-up Information     [facility] Follow up.   Specialty: Gastroenterology Why: a referral was made for you to get a colonoscopy. please call to  confirm they are scheduling your appointment Contact information: [place] [address] [facility], [place] Follow up.   Specialty: General Surgery Why: call as needed. you may need to make an appointment with [place] after your colonoscopy if your GI physician thinks surgical evaluation is warranted. Contact information: [place] [phone]        Charlott Rakes, MD Follow up in 2 day(s).   Specialty: Family Medicine Why: Hospital follow up Contact information: [place]  [address] [phone]                Discharge Exam: Danley Danker Weights   07/24/21 1908 07/25/21 1700  Weight: 115.7 kg 113 kg   General exam: Awake, laying in bed, in nad Respiratory system: Normal respiratory effort, no wheezing Cardiovascular system: regular rate, s1, s2 Gastrointestinal system: Soft, nondistended, positive BS Central nervous system: CN2-12 grossly intact, strength intact Extremities: Perfused, no clubbing Skin: Normal skin turgor, no notable skin lesions seen Psychiatry: Mood normal // no visual hallucinations   Condition at discharge: fair  The results of significant diagnostics from this hospitalization (including imaging, microbiology, ancillary and laboratory) are listed below for reference.   Imaging Studies: DG CHEST PORT 1 VIEW  Result Date: 07/25/2021 CLINICAL DATA:  Hypoxia EXAM: PORTABLE CHEST 1 VIEW COMPARISON:  03/18/2020 FINDINGS: Transverse diameter of heart is increased. Low position of diaphragms may suggest COPD. Central pulmonary vessels are prominent without signs of alveolar pulmonary edema. There are no new focal infiltrates. There is no significant pleural effusion or pneumothorax. IMPRESSION: Central pulmonary vessels are prominent without signs of alveolar pulmonary edema. There is no focal pulmonary  consolidation. Electronically Signed   By: Elmer Picker M.D.   On: 07/25/2021 15:30   CT Abdomen Pelvis W Contrast  Result Date: 07/25/2021 CLINICAL DATA:  Right lower quadrant abdominal pain EXAM: CT ABDOMEN AND PELVIS WITH CONTRAST TECHNIQUE: Multidetector CT imaging of the abdomen and pelvis was performed using the standard protocol following bolus administration of intravenous contrast. RADIATION DOSE REDUCTION: This exam was performed according to the departmental dose-optimization program which includes automated exposure control, adjustment of the mA and/or kV according to patient size and/or use of iterative reconstruction technique. CONTRAST:  131m OMNIPAQUE IOHEXOL 300 MG/ML  SOLN COMPARISON:  None Available. FINDINGS: Lower chest: Visualized lung bases are clear. Moderate coronary artery calcification. Global cardiac size within normal limits. Hepatobiliary: Mild hepatomegaly. No enhancing intrahepatic mass. No intra or extrahepatic biliary ductal dilation. Gallbladder unremarkable. Pancreas: Unremarkable Spleen: Unremarkable Adrenals/Urinary Tract: The adrenal glands are unremarkable. The kidneys are normal. Extensive inflammatory changes within the pelvis  are intimately associated with the dome of the bladder which demonstrates asymmetric wall thickening in this region. No vesicular fistula, however, is clearly identified. The bladder is not distended. The anterior aspect of the bladder approaches the direct left inguinal hernia defect, best appreciated on sagittal image # 74/7. Stomach/Bowel: There is severe descending and sigmoid colonic diverticulosis. There is superimposed severe circumferential bowel wall thickening involving the sigmoid colon with extensive pericolonic inflammatory stranding. There is layering inflammatory fluid along the left pelvic sidewall with an ill-defined region of low attenuation centrally within this area, best seen on sagittal image # 71/7 and axial image #  71/3 likely representing a focus of phlegmon. A discrete drainable fluid collection, however, is not identified at this time. There is, however, a rim enhancing 2.4 cm fluid collection identified within the deep pelvis anterior to the mid rectum best seen on axial image # 80/3. The findings are, altogether, most in keeping with changes of perforated diverticulitis, however, an infiltrative malignancy is not completely excluded. Shotty adenopathy within the sigmoid mesentery is likely reactive in nature. Multiple loops of small bowel are seen within a moderate direct right inguinal hernia sac. Stomach, small bowel, and large bowel are otherwise unremarkable. No evidence of obstruction. No free intraperitoneal gas. Appendix normal. Vascular/Lymphatic: Moderate aortoiliac atherosclerotic calcification. No aortic aneurysm. No pathologic adenopathy within the abdomen and pelvis. Reproductive: Prostate is unremarkable. Other: Small left and moderate right inguinal hernias are present. Rectum unremarkable. Musculoskeletal: Degenerative changes are seen within the lumbar spine. No acute bone abnormality. IMPRESSION: 1. Severe descending and sigmoid colonic diverticulosis with superimposed severe circumferential bowel wall thickening involving the sigmoid colon and extensive pericolonic inflammatory stranding. Findings are most in keeping with changes of perforated diverticulitis, however, an infiltrative malignancy is not completely excluded. Correlation with endoscopy once the patient's acute issues have resolved is recommended. 2. Extensive inflammatory change along the left pelvic sidewall with small focus of central phlegmon. More well-defined 2.3 cm rim enhancing fluid collection within the deep pelvis anterior to the mid rectum. 3. Extensive inflammatory changes within the pelvis are intimately associated with the dome of the bladder which demonstrates asymmetric wall thickening. No vesicular fistula is clearly  identified. 4. Moderate direct right inguinal hernia containing multiple loops of small bowel without evidence of obstruction. Small fat containing left inguinal hernia marginally contains the anterior bladder wall. 5. Mild hepatomegaly. 6. Moderate coronary artery calcification. Electronically Signed   By: Fidela Salisbury M.D.   On: 07/25/2021 03:22    Microbiology: Results for orders placed or performed during the hospital encounter of 03/18/20  Resp Panel by RT-PCR (Flu A&B, Covid) Nasopharyngeal Swab     Status: None   Collection Time: 03/18/20  1:31 PM   Specimen: Nasopharyngeal Swab; Nasopharyngeal(NP) swabs in vial transport medium  Result Value Ref Range Status   SARS Coronavirus 2 by RT PCR NEGATIVE NEGATIVE Final    Comment: (NOTE) SARS-CoV-2 target nucleic acids are NOT DETECTED.  The SARS-CoV-2 RNA is generally detectable in upper respiratory specimens during the acute phase of infection. The lowest concentration of SARS-CoV-2 viral copies this assay can detect is 138 copies/mL. A negative result does not preclude SARS-Cov-2 infection and should not be used as the sole basis for treatment or other patient management decisions. A negative result may occur with  improper specimen collection/handling, submission of specimen other than nasopharyngeal swab, presence of viral mutation(s) within the areas targeted by this assay, and inadequate number of viral copies(<138 copies/mL). A negative  result must be combined with clinical observations, patient history, and epidemiological information. The expected result is Negative.  Fact Sheet for Patients:  EntrepreneurPulse.com.au  Fact Sheet for Healthcare Providers:  IncredibleEmployment.be  This test is no t yet approved or cleared by the [place] FDA and  has been authorized for detection and/or diagnosis of SARS-CoV-2 by FDA under an Emergency Use Authorization (EUA). This EUA will remain   in effect (meaning this test can be used) for the duration of the COVID-19 declaration under Section 564(b)(1) of the Act, 21 U.S.C.section 360bbb-3(b)(1), unless the authorization is terminated  or revoked sooner.       Influenza A by PCR NEGATIVE NEGATIVE Final   Influenza B by PCR NEGATIVE NEGATIVE Final    Comment: (NOTE) The Xpert Xpress SARS-CoV-2/FLU/RSV plus assay is intended as an aid in the diagnosis of influenza from Nasopharyngeal swab specimens and should not be used as a sole basis for treatment. Nasal washings and aspirates are unacceptable for Xpert Xpress SARS-CoV-2/FLU/RSV testing.  Fact Sheet for Patients: EntrepreneurPulse.com.au  Fact Sheet for Healthcare Providers: IncredibleEmployment.be  This test is not yet approved or cleared by the [place] FDA and has been authorized for detection and/or diagnosis of SARS-CoV-2 by FDA under an Emergency Use Authorization (EUA). This EUA will remain in effect (meaning this test can be used) for the duration of the COVID-19 declaration under Section 564(b)(1) of the Act, 21 U.S.C. section 360bbb-3(b)(1), unless the authorization is terminated or revoked.  Performed at [facility], [place] [address] [place], [place] [address]   Culture, blood (routine x 2)     Status: None   Collection Time: 03/18/20  4:45 PM   Specimen: BLOOD  Result Value Ref Range Status   Specimen Description BLOOD SITE NOT SPECIFIED  Final   Special Requests   Final    BOTTLES DRAWN AEROBIC AND ANAEROBIC Blood Culture adequate volume   Culture   Final    NO GROWTH 5 DAYS Performed at [facility], [place] [address], [place], [place] [address]    Report Status 03/23/2020 FINAL  Final  Culture, blood (routine x 2)     Status: None   Collection Time: 03/18/20  7:00 PM   Specimen: BLOOD  Result Value Ref Range Status   Specimen Description BLOOD LEFT ANTECUBITAL  Final   Special Requests    Final    BOTTLES DRAWN AEROBIC ONLY Blood Culture adequate volume   Culture   Final    NO GROWTH 5 DAYS Performed at [facility], [place] [address] [place], [place] [address]    Report Status 03/23/2020 FINAL  Final    Labs: CBC: Recent Labs  Lab 07/24/21 1929 07/26/21 0353 07/27/21 0328 07/28/21 0025  WBC 11.6* 11.0* 9.7 9.1  NEUTROABS 8.7*  --   --   --   HGB 18.1* 16.2 16.7 15.3  HCT 56.5* 49.3 51.0 47.3  MCV 99.6 97.6 96.6 97.3  PLT 148* PLATELET CLUMPS NOTED ON SMEAR, UNABLE TO ESTIMATE 144* 627*   Basic Metabolic Panel: Recent Labs  Lab 07/24/21 1929 07/26/21 0353 07/27/21 0328 07/28/21 0025  NA 138 138 135 139  K 3.9 3.7 3.8 3.4*  CL 99 99 99 103  CO2 '29 29 26 28  '$ GLUCOSE 157* 126* 165* 157*  BUN 12 5* 7* 10  CREATININE 0.58* 0.63 0.59* 0.66  CALCIUM 9.0 8.4* 8.6* 8.4*  MG  --  1.4* 2.0  --   PHOS  --  2.8  --   --    Liver Function Tests: Recent Labs  Lab 07/24/21 1929 07/25/21 1841 07/27/21 0328 07/28/21 0025  AST 15 12* 10* 12*  ALT '13 11 9 10  '$ ALKPHOS 61 50 47 41  BILITOT 1.0 0.9 1.2 0.6  PROT 7.4 6.4* 6.4* 6.1*  ALBUMIN 2.8* 2.3* 2.1* 2.1*   CBG: No results for input(s): "GLUCAP" in the last 168 hours.  Discharge time spent: less than 30 minutes.  Signed: Marylu Lund, MD [facility] 07/28/2021

## 2021-07-28 NOTE — Plan of Care (Signed)
  Problem: Activity: Goal: Risk for activity intolerance will decrease Outcome: Progressing   

## 2021-07-28 NOTE — TOC Transition Note (Signed)
Transition of Care [facility]) - CM/SW Discharge Note   Patient Details  Name: Donald Bender MRN: [id number] Date of Birth: 29-Oct-1960  [facility]) CM/SW Contact:  Tom-Johnson, Renea Ee, RN Phone Number: 07/28/2021, 1:12 PM   Clinical Narrative:     Patient is scheduled for discharge today. Denies any needs at this time. Significant other to transport at discharge. No further TOC needs noted.   Final next level of care: Home/Self Care Barriers to Discharge: Barriers Resolved   Patient Goals and CMS Choice Patient states their goals for this hospitalization and ongoing recovery are:: To return home CMS Medicare.gov Compare Post Acute Care list provided to:: Patient Choice offered to / list presented to : NA  Discharge Placement                Patient to be transferred to facility by: Significant Other      Discharge Plan and Services In-house Referral: Financial Counselor Discharge Planning Services: CM Consult, [place] [facility]            DME Arranged: N/A DME Agency: NA       HH Arranged: NA HH Agency: NA        Social Determinants of Health (SDOH) Interventions     Readmission Risk Interventions     No data to display

## 2021-07-28 NOTE — Progress Notes (Signed)
DISCHARGE NOTE HOME Donald Bender to be discharged Home per MD order. Discussed prescriptions and follow up appointments with the patient. Prescriptions given to patient a,fiancee and his brother. medication list explained in detail. Patient verbalized understanding.  Nurse devoted 30 mins with patient's brother -Gerald Stabs and his fiancee-Diana about the importance of being compliant with his medications .Explained the the effects and what would happened if he would not take his medications.Encouraged patient to quit smoking and drinking..  Skin clean, dry and intact without evidence of skin break down, no evidence of skin tears noted. IV catheter discontinued intact. Site without signs and symptoms of complications. Dressing and pressure applied. Pt denies pain at the site currently. No complaints noted.  Patient free of lines, drains, and wounds.   An After Visit Summary (AVS) was printed and given to the patient. Patient escorted via wheelchair, and discharged home via private auto.  [place], Zenon Mayo, RN

## 2021-07-28 NOTE — Progress Notes (Signed)
Patient ID: Donald Bender, male   DOB: [birth year], 61 y.o.   MRN: [id number] [facility] Surgery Progress Note     Subjective: CC-  Feels much better. Had a BM yesterday. Denies abd pain. Tolerating SOFT diet.  Objective: Vital signs in last 24 hours: Temp:  [98.1 F (36.7 C)-98.3 F (36.8 C)] 98.1 F (36.7 C) (06/30 0544) Pulse Rate:  [86-101] 101 (06/30 0544) Resp:  [17-20] 20 (06/30 0544) BP: (158-192)/(94-117) 192/112 (06/30 0544) SpO2:  [92 %-94 %] 93 % (06/30 0544) Last BM Date : 07/24/21  Intake/Output from previous day: 06/29 0701 - 06/30 0700 In: 2590.5 [P.O.:900; I.V.:1526.6; IV Piggyback:163.9] Out: 1500 [Urine:1500] Intake/Output this shift: Total I/O In: 300 [P.O.:300] Out: -   PE: Gen:  Alert, NAD Abd: Soft, ND, nontender  Lab Results:  Recent Labs    07/27/21 0328 07/28/21 0025  WBC 9.7 9.1  HGB 16.7 15.3  HCT 51.0 47.3  PLT 144* 135*   BMET Recent Labs    07/27/21 0328 07/28/21 0025  NA 135 139  K 3.8 3.4*  CL 99 103  CO2 26 28  GLUCOSE 165* 157*  BUN 7* 10  CREATININE 0.59* 0.66  CALCIUM 8.6* 8.4*   PT/INR No results for input(s): "LABPROT", "INR" in the last 72 hours. CMP     Component Value Date/Time   NA 139 07/28/2021 0025   NA 137 06/02/2020 1450   K 3.4 (L) 07/28/2021 0025   CL 103 07/28/2021 0025   CO2 28 07/28/2021 0025   GLUCOSE 157 (H) 07/28/2021 0025   BUN 10 07/28/2021 0025   BUN 48 (H) 06/02/2020 1450   CREATININE 0.66 07/28/2021 0025   CREATININE 0.64 (L) 11/21/2015 0915   CALCIUM 8.4 (L) 07/28/2021 0025   PROT 6.1 (L) 07/28/2021 0025   PROT 7.3 06/02/2020 1450   ALBUMIN 2.1 (L) 07/28/2021 0025   ALBUMIN 4.3 06/02/2020 1450   AST 12 (L) 07/28/2021 0025   ALT 10 07/28/2021 0025   ALKPHOS 41 07/28/2021 0025   BILITOT 0.6 07/28/2021 0025   BILITOT 0.9 06/02/2020 1450   GFRNONAA >60 07/28/2021 0025   GFRNONAA >89 11/21/2015 0915   GFRAA 136 01/24/2017 1512   GFRAA >89 11/21/2015 0915   Lipase      Component Value Date/Time   LIPASE 49 07/24/2021 1929       Studies/Results: No results found.  Anti-infectives: Anti-infectives (From admission, onward)    Start     Dose/Rate Route Frequency Ordered Stop   07/25/21 1000  piperacillin-tazobactam (ZOSYN) IVPB 3.375 g        3.375 g 12.5 mL/hr over 240 Minutes Intravenous Every 8 hours 07/25/21 0554     07/25/21 0415  piperacillin-tazobactam (ZOSYN) IVPB 3.375 g        3.375 g 100 mL/hr over 30 Minutes Intravenous  Once 07/25/21 0404 07/25/21 0449        Assessment/Plan 61 y/o M who presented with 3 weeks of progressive lower abdominal pain, decreased caliber stools, and intermittent acholic stools. CT w/ inflammatory changes around the sigmoid colon and small phlegmon/abscess. DDx: sigmoid diverticulitis with abscess vs perforated colon mass - afebrile, WBC WNL - clinically improving- abd pain resolved, having bowel function, tolerating PO - ok for transition to PO abx and discharge home. Would complete a total of 14 days abx. GI referral for colonoscopy placed.   ID - zosyn FEN - soft diet, Boost VTE - SCDs, lovnoex Foley - none    LOS: 1  day    I reviewed nursing notes, hospitalist notes, last 24 h vitals and pain scores, last 48 h intake and output, last 24 h labs and trends, and last 24 h imaging results.    LOS: 3 days    [facility] 07/28/2021, 8:58 AM Please see Amion for pager number during day hours 7:00am-4:30pm

## 2021-07-28 NOTE — Progress Notes (Signed)
Pharmacy Antibiotic Note  Donald Bender is a 60 y.o. male admitted on 07/24/2021 with  diverticulitis with perforation .  Pharmacy has been consulted for Zosyn dosing.  Plan: Continue Zosyn 3.375g IV q8h (4 hour infusion). Monitor for clinical course and deescalation  Height: '6\' 3"'$  (190.5 cm) Weight: 113 kg (249 lb 1.9 oz) IBW/kg (Calculated) : 84.5  Temp (24hrs), Avg:98.2 F (36.8 C), Min:98.1 F (36.7 C), Max:98.3 F (36.8 C)  Recent Labs  Lab 07/24/21 1929 07/26/21 0353 07/27/21 0328 07/28/21 0025  WBC 11.6* 11.0* 9.7 9.1  CREATININE 0.58* 0.63 0.59* 0.66     Estimated Creatinine Clearance: 131.5 mL/min (by C-G formula based on SCr of 0.66 mg/dL).    No Known Allergies  Donald Bender, PharmD, BCPS, FNKF Clinical Pharmacist [place] Please utilize Amion for appropriate phone number to reach the unit pharmacist ([place])  07/28/2021 7:36 AM

## 2021-07-31 ENCOUNTER — Telehealth: Payer: Self-pay

## 2021-07-31 NOTE — Telephone Encounter (Signed)
Transition Care Management Follow-up Telephone Call Date of discharge and from where: 07/28/2021, [facility] How have you been since you were released from the hospital? He stated he is getting better, just some aches.  Any questions or concerns? No- he is aware he has been scheduled for follow up at [facility]. I asked him if he would like an appointment at [facility] instead and he said he will keep the appointment at [facility]  Items Reviewed: Did the pt receive and understand the discharge instructions provided? Yes  Medications obtained and verified? Yes - he said he has all medications and did not have any questions about the med regime. He said he is taking them as ordered.  Other? No  Any new allergies since your discharge? No  Dietary orders reviewed? Yes Do you have support at home? Yes   Home Care and Equipment/Supplies: Were home health services ordered? no If so, what is the name of the agency? N/a  Has the agency set up a time to come to the patient's home? not applicable Were any new equipment or medical supplies ordered?  No What is the name of the medical supply agency? N/a Were you able to get the supplies/equipment? not applicable Do you have any questions related to the use of the equipment or supplies? No  Functional Questionnaire: (I = Independent and D = Dependent) ADLs: independent   Follow up appointments reviewed:  PCP Hospital f/u appt confirmed? Yes  Scheduled to see Juluis Mire, NP - 08/14/2021.  [facility] f/u appt confirmed?  Nothing scheduled at this time.   Are transportation arrangements needed? No  If their condition worsens, is the pt aware to call PCP or go to the Emergency Dept.? Yes Was the patient provided with contact information for the PCP's office or ED? Yes Was to pt encouraged to call back with questions or concerns? Yes

## 2021-08-03 ENCOUNTER — Encounter: Payer: Self-pay | Admitting: Gastroenterology

## 2021-08-14 ENCOUNTER — Encounter (INDEPENDENT_AMBULATORY_CARE_PROVIDER_SITE_OTHER): Payer: Self-pay | Admitting: Primary Care

## 2021-08-14 ENCOUNTER — Other Ambulatory Visit: Payer: Self-pay

## 2021-08-14 ENCOUNTER — Ambulatory Visit (INDEPENDENT_AMBULATORY_CARE_PROVIDER_SITE_OTHER): Payer: Self-pay | Admitting: Primary Care

## 2021-08-14 VITALS — BP 153/84 | HR 89 | Temp 97.8°F | Ht 75.0 in | Wt 239.4 lb

## 2021-08-14 DIAGNOSIS — F17210 Nicotine dependence, cigarettes, uncomplicated: Secondary | ICD-10-CM

## 2021-08-14 DIAGNOSIS — Z720 Tobacco use: Secondary | ICD-10-CM

## 2021-08-14 DIAGNOSIS — E119 Type 2 diabetes mellitus without complications: Secondary | ICD-10-CM

## 2021-08-14 DIAGNOSIS — I10 Essential (primary) hypertension: Secondary | ICD-10-CM

## 2021-08-14 MED ORDER — NICOTINE 14 MG/24HR TD PT24
14.0000 mg | MEDICATED_PATCH | Freq: Every day | TRANSDERMAL | 0 refills | Status: DC
  Filled 2021-08-14: qty 28, 28d supply, fill #0

## 2021-08-14 MED ORDER — METFORMIN HCL 1000 MG PO TABS
1000.0000 mg | ORAL_TABLET | Freq: Two times a day (BID) | ORAL | 3 refills | Status: DC
  Filled 2021-08-14: qty 180, 90d supply, fill #0
  Filled 2022-01-23: qty 180, 90d supply, fill #1

## 2021-08-14 MED ORDER — NICOTINE 7 MG/24HR TD PT24
7.0000 mg | MEDICATED_PATCH | Freq: Every day | TRANSDERMAL | 0 refills | Status: DC
  Filled 2021-08-14: qty 28, 28d supply, fill #0

## 2021-08-14 MED ORDER — NICOTINE 21 MG/24HR TD PT24
21.0000 mg | MEDICATED_PATCH | Freq: Every day | TRANSDERMAL | 0 refills | Status: DC
  Filled 2021-08-14: qty 28, 28d supply, fill #0

## 2021-08-14 MED ORDER — EMPAGLIFLOZIN 25 MG PO TABS
25.0000 mg | ORAL_TABLET | Freq: Every day | ORAL | 1 refills | Status: DC
  Filled 2021-08-14: qty 90, 90d supply, fill #0
  Filled 2021-08-21: qty 14, 14d supply, fill #0
  Filled 2021-09-04: qty 14, 14d supply, fill #1

## 2021-08-14 NOTE — Patient Instructions (Signed)
Discussed  co- morbidities with uncontrol diabetes  Complications -diabetic retinopathy, (close your eyes ? What do you see nothing) nephropathy decrease in kidney function- can lead to dialysis-on a machine 3 days a week to filter your kidney, neuropathy- numbness and tinging in your hands and feet,  increase risk of heart attack and stroke, and amputation due to decrease wound healing and circulation. Decrease your risk by taking medication daily as prescribed, monitor carbohydrates- foods that are high in carbohydrates are the following rice, potatoes, breads, sugars, and pastas.  Reduction in the intake (eating) will assist in lowering your blood sugars. Exercise daily at least 30 minutes daily. (Remember how your eyes popped wide open)

## 2021-08-14 NOTE — Progress Notes (Signed)
[facility]   Subjective:   Donald Bender is a 61 y.o. male presents for hospital follow up . Presented to the ED with complains of having constipation with difficulty passing stools.  States intermittently had small ropelike stools that have been white and pale in color.  Also, symptoms include fatigue and generalized malaise.   Admitted to drinking 1/5 of liquor per day on average and smokes more than 2 packs of cigarettes. Admit date to the hospital was 07/24/21, patient was discharged from the hospital on 07/28/21, patient was admitted for: Diverticulitis of large intestine with perforation, SIRS (systemic inflammatory response syndrome) (HCC), Leukocytosis, Hypertensive urgency, Polycythemia, Type 2 diabetes mellitus without complication, without long-term current use of insulin ([name]), Tobacco abuse, Alcohol abuse, inguinal hernia,   Obesity (BMI 30-39.9) and Acute respiratory failure with hypoxia ([name]). Patient has No headache, No chest pain, No abdominal pain - No Nausea, No new weakness tingling or numbness, No Cough - shortness of breath   Past Medical History:  Diagnosis Date   Diabetes mellitus without complication (HCC)    Hypertension      No Known Allergies    Current Outpatient Medications on File Prior to Visit  Medication Sig Dispense Refill   amLODipine (NORVASC) 10 MG tablet Take 1 tablet (10 mg total) by mouth daily. (Patient not taking: Reported on 07/25/2021) 30 tablet 6   aspirin EC 81 MG tablet Take 1 tablet (81 mg total) by mouth daily. 30 tablet 5   atorvastatin (LIPITOR) 40 MG tablet Take 1 tablet (40 mg total) by mouth daily. For cholesterol (Patient not taking: Reported on 07/25/2021) 30 tablet 6   colchicine 0.6 MG tablet TAKE 2 TABLETS BY MOUTH AT THE ONSET OF A GOUT ATTACK, MAY REPEAT 1 TABLET IN 2 HOURS IF SYMPTOMS PERSIST. (Patient not taking: Reported on 06/30/2020) 90 tablet 3   hydrALAZINE (APRESOLINE) 25 MG tablet Take 1 tablet (25 mg total) by  mouth every 8 (eight) hours. 90 tablet 0   hydroxypropyl methylcellulose / hypromellose (ISOPTO TEARS / GONIOVISC) 2.5 % ophthalmic solution Place 1 drop into both eyes 2 (two) times daily as needed for dry eyes.     ibuprofen (ADVIL) 200 MG tablet Take 400 mg by mouth every 6 (six) hours as needed for headache or moderate pain.     losartan (COZAAR) 100 MG tablet Take 1 tablet (100 mg total) by mouth daily. 30 tablet 0   metFORMIN (GLUCOPHAGE-XR) 500 MG 24 hr tablet Take 1 tablet by mouth once daily with breakfast (Patient not taking: Reported on 07/25/2021) 30 tablet 3   naproxen sodium (ALEVE) 220 MG tablet Take 440 mg by mouth 2 (two) times daily as needed (pain).     nicotine (NICODERM CQ - DOSED IN MG/24 HOURS) 21 mg/24hr patch Place 1 patch (21 mg total) onto the skin daily. (Patient not taking: Reported on 06/30/2020) 28 patch 0   spironolactone (ALDACTONE) 50 MG tablet Take 1 tablet (50 mg total) by mouth daily. (Patient not taking: Reported on 07/25/2021) 90 tablet 1   No current facility-administered medications on file prior to visit.     Review of System: Comprehensive ROS Pertinent positive and negative noted in HPI    Objective:  BP (!) 153/84 (BP Location: Right Arm, Patient Position: Sitting, Cuff Size: Normal)   Pulse 89   Temp 97.8 F (36.6 C) (Oral)   Ht '6\' 3"'$  (1.905 m)   Wt 239 lb 6.4 oz (108.6 kg)   SpO2  95%   BMI 29.92 kg/m   Filed Weights   08/14/21 1444  Weight: 239 lb 6.4 oz (108.6 kg)    Physical Exam: General Appearance: Well nourished, in no apparent distress. Eyes: PERRLA, EOMs, conjunctiva no swelling or erythema Sinuses: No Frontal/maxillary tenderness ENT/Mouth: Ext aud canals clear, TMs without erythema, bulging. Rhinophyma, Hearing normal.  Neck: Supple, thyroid normal.  Respiratory: Respiratory effort normal, BS equal bilaterally without rales, rhonchi, wheezing or stridor.  Cardio: RRR with no MRGs. Brisk peripheral pulses without edema.   Abdomen: Soft, + BS.  Non tender, no guarding, rebound, hernias, masses. Lymphatics: Non tender without lymphadenopathy.  Musculoskeletal: Full ROM, 5/5 strength, normal gait.  Skin: Warm, dry without rashes, lesions, ecchymosis.  Neuro: Cranial nerves intact. Normal muscle tone, no cerebellar symptoms. Sensation intact.  Psych: Awake and oriented X 3, normal affect, Insight and Judgment appropriate.    Assessment:  Donald Bender was seen today for hospitalization follow-up.  Diagnoses and all orders for this visit:  Tobacco abuse - I have recommended complete cessation of tobacco use. I have discussed various options available for assistance with tobacco cessation including over the counter methods (Nicotine gum, patch and lozenges). We also discussed prescription options (Chantix, Nicotine Inhaler / Nasal Spray). The patient is  interested in pursuing a prescription tobacco cessation options at this time.  -     nicotine (NICODERM CQ) 21 mg/24hr patch; Place 1 patch (21 mg total) onto the skin daily. -     nicotine (NICODERM CQ) 14 mg/24hr patch; Place 1 patch (14 mg total) onto the skin daily. -     nicotine (NICODERM CQ) 7 mg/24hr patch; Place 1 patch (7 mg total) onto the skin daily.  Essential hypertension BP goal - < 140/90 Explained that having normal blood pressure is the goal and medications are helping to get to goal and maintain normal blood pressure. DIET: Limit salt intake, read nutrition labels to check salt content, limit fried and high fatty foods  Avoid using multisymptom OTC cold preparations that generally contain sudafed which can rise BP. Consult with pharmacist on best cold relief products to use for persons with HTN EXERCISE Discussed incorporating exercise such as walking - 30 minutes most days of the week and can do in 10 minute intervals     Type 2 diabetes mellitus without complication, without long-term current use of insulin (HCC) Discussed  co- morbidities with  uncontrol diabetes  Complications -diabetic retinopathy, (close your eyes ? What do you see nothing) nephropathy decrease in kidney function- can lead to dialysis-on a machine 3 days a week to filter your kidney, neuropathy- numbness and tinging in your hands and feet,  increase risk of heart attack and stroke, and amputation due to decrease wound healing and circulation. Decrease your risk by taking medication daily as prescribed, monitor carbohydrates- foods that are high in carbohydrates are the following rice, potatoes, breads, sugars, and pastas.  Reduction in the intake (eating) will assist in lowering your blood sugars. Exercise daily at least 30 minutes daily.   -     metFORMIN (GLUCOPHAGE) 1000 MG tablet; Take 1 tablet (1,000 mg total) by mouth 2 (two) times daily with a meal. -     empagliflozin (JARDIANCE) 25 MG TABS tablet; Take 1 tablet (25 mg total) by mouth daily before breakfast.   This note has been created with Surveyor, quantity. Any transcriptional errors are unintentional.   Kerin Perna, NP 08/14/2021, 3:01 PM

## 2021-08-17 ENCOUNTER — Other Ambulatory Visit: Payer: Self-pay

## 2021-08-18 ENCOUNTER — Other Ambulatory Visit: Payer: Self-pay

## 2021-08-21 ENCOUNTER — Other Ambulatory Visit: Payer: Self-pay

## 2021-09-04 ENCOUNTER — Other Ambulatory Visit: Payer: Self-pay

## 2021-09-06 ENCOUNTER — Ambulatory Visit (INDEPENDENT_AMBULATORY_CARE_PROVIDER_SITE_OTHER): Payer: Self-pay | Admitting: Gastroenterology

## 2021-09-06 ENCOUNTER — Encounter: Payer: Self-pay | Admitting: Gastroenterology

## 2021-09-06 VITALS — BP 124/70 | HR 91 | Ht 75.0 in | Wt 228.0 lb

## 2021-09-06 DIAGNOSIS — R970 Elevated carcinoembryonic antigen [CEA]: Secondary | ICD-10-CM

## 2021-09-06 DIAGNOSIS — Z1211 Encounter for screening for malignant neoplasm of colon: Secondary | ICD-10-CM

## 2021-09-06 DIAGNOSIS — K5720 Diverticulitis of large intestine with perforation and abscess without bleeding: Secondary | ICD-10-CM

## 2021-09-06 NOTE — Progress Notes (Signed)
Chief Complaint: Colon cancer screening, diverticulitis, elevated CEA   Referring Provider:     Jill Alexanders, PA (General Surgery)    HPI:     Donald Bender is a 61 y.o. male with history of HTN, diabetes, tobacco use, EtOH, gout, referred to the Gastroenterology Clinic for evaluation of recent diveritculitis and discuss CRC screening.   Hospitalization 6/26-30 with acute diverticulitis with contained perforation (abscess/phlegmon around sigmoid).   - 07/25/2021: CT A/P: Severe descending/sigmoid diverticulosis with severe inflammation in the sigmoid colon and extensive pericolonic inflammatory stranding, consistent with perforated diverticulitis, small central phlegmon, 2.3 cm rim-enhancing fluid collection in the deep pelvis anterior to mid rectum. - Was treated with IV ABX and transition to Augmentin x 14 days.  General surgery consulted with plan for medical management and recommended outpatient colonoscopy. - Leukocytosis resolved on hospital admission - CEA 7.1  Today, states no prior hx of diverticulitis. Pain has since resolved and bowel habits have returned to baseline. No hematochezia or melena.   Has since stopped EtOH completely. Still smoking.   No previous EGD or colonoscopy.  Brother with hx of diverticulitis requring surgical resection. No known family history of CRC, GI malignancy, liver disease, pancreatic disease, or IBD.      Past Medical History:  Diagnosis Date   Diabetes mellitus without complication ([place])    Diverticulitis    Hypertension      Past Surgical History:  Procedure Laterality Date   NO PAST SURGERIES     Family History  Problem Relation Age of Onset   Leukemia Mother 61   Diverticulitis Brother    Colon cancer Neg Hx    Stomach cancer Neg Hx    Esophageal cancer Neg Hx    Colon polyps Neg Hx    Social History   Tobacco Use   Smoking status: Every Day    Packs/day: 1.00    Types: Cigarettes   Smokeless  tobacco: Never   Tobacco comments:    Smoking 1 ppd  Vaping Use   Vaping Use: Never used  Substance Use Topics   Alcohol use: Yes    Alcohol/week: 6.0 standard drinks of alcohol    Types: 6 Cans of beer per week    Comment: daily drinker   Drug use: No   Current Outpatient Medications  Medication Sig Dispense Refill   aspirin EC 81 MG tablet Take 1 tablet (81 mg total) by mouth daily. 30 tablet 5   empagliflozin (JARDIANCE) 25 MG TABS tablet Take 1 tablet (25 mg total) by mouth daily before breakfast. 90 tablet 1   hydrALAZINE (APRESOLINE) 25 MG tablet Take 1 tablet (25 mg total) by mouth every 8 (eight) hours. 90 tablet 0   losartan (COZAAR) 100 MG tablet Take 1 tablet (100 mg total) by mouth daily. 30 tablet 0   metFORMIN (GLUCOPHAGE) 1000 MG tablet Take 1 tablet (1,000 mg total) by mouth 2 (two) times daily with a meal. 180 tablet 3   amLODipine (NORVASC) 10 MG tablet Take 1 tablet (10 mg total) by mouth daily. (Patient not taking: Reported on 07/25/2021) 30 tablet 6   atorvastatin (LIPITOR) 40 MG tablet Take 1 tablet (40 mg total) by mouth daily. For cholesterol (Patient not taking: Reported on 07/25/2021) 30 tablet 6   colchicine 0.6 MG tablet TAKE 2 TABLETS BY MOUTH AT THE ONSET OF A GOUT ATTACK, MAY REPEAT 1 TABLET IN 2 HOURS IF  SYMPTOMS PERSIST. (Patient not taking: Reported on 06/30/2020) 90 tablet 3   hydroxypropyl methylcellulose / hypromellose (ISOPTO TEARS / GONIOVISC) 2.5 % ophthalmic solution Place 1 drop into both eyes 2 (two) times daily as needed for dry eyes. (Patient not taking: Reported on 09/06/2021)     ibuprofen (ADVIL) 200 MG tablet Take 400 mg by mouth every 6 (six) hours as needed for headache or moderate pain. (Patient not taking: Reported on 09/06/2021)     naproxen sodium (ALEVE) 220 MG tablet Take 440 mg by mouth 2 (two) times daily as needed (pain). (Patient not taking: Reported on 09/06/2021)     nicotine (NICODERM CQ) 14 mg/24hr patch Place 1 patch (14 mg total) onto  the skin daily. (Patient not taking: Reported on 09/06/2021) 28 patch 0   nicotine (NICODERM CQ) 21 mg/24hr patch Place 1 patch (21 mg total) onto the skin daily. (Patient not taking: Reported on 09/06/2021) 28 patch 0   nicotine (NICODERM CQ) 7 mg/24hr patch Place 1 patch (7 mg total) onto the skin daily. (Patient not taking: Reported on 09/06/2021) 28 patch 0   spironolactone (ALDACTONE) 50 MG tablet Take 1 tablet (50 mg total) by mouth daily. (Patient not taking: Reported on 07/25/2021) 90 tablet 1   No current facility-administered medications for this visit.   No Known Allergies   Review of Systems: All systems reviewed and negative except where noted in HPI.     Physical Exam:    Wt Readings from Last 3 Encounters:  09/06/21 228 lb (103.4 kg)  08/14/21 239 lb 6.4 oz (108.6 kg)  07/25/21 249 lb 1.9 oz (113 kg)    BP 124/70   Pulse 91   Ht '6\' 3"'$  (1.905 m)   Wt 228 lb (103.4 kg)   SpO2 94%   BMI 28.50 kg/m  Constitutional:  Pleasant, in no acute distress. Psychiatric: Normal mood and affect. Behavior is normal. Cardiovascular: Normal rate, regular rhythm. No edema Pulmonary/chest: Effort normal and breath sounds normal. No wheezing, rales or rhonchi. Abdominal: Soft, nondistended, nontender. Bowel sounds active throughout. There are no masses palpable. No hepatomegaly. Neurological: Alert and oriented to person place and time. Skin: Skin is warm and dry. No rashes noted.   ASSESSMENT AND PLAN;   1) History of diverticulitis Hospital admission in 06/2021 with diverticulitis c/b likely contained perforation (abscess/phlegmon).  Good response to medical management without need for surgical or IR intervention.  Completed ABX without issue and bowel habits back to baseline. - Colonoscopy.  To be scheduled in plus weeks Hospital admission (September)  2) Colon cancer screening 3) Elevated CEA - No previous colonoscopy.  Perform CRC screening with colonoscopy as above - Plan for  pediatric colonoscope  The indications, risks, and benefits of colonoscopy were explained to the patient in detail. Risks include but are not limited to bleeding, perforation, adverse reaction to medications, and cardiopulmonary compromise. Sequelae include but are not limited to the possibility of surgery, hospitalization, and mortality. The patient verbalized understanding and wished to proceed. All questions answered, referred to the scheduler and bowel prep ordered. Further recommendations pending results of the exam.     Lavena Bullion, DO, FACG  09/06/2021, 9:28 AM   Jill Alexanders, PA*

## 2021-09-06 NOTE — Patient Instructions (Addendum)
If you are age 61 or younger, your body mass index should be between 19-25. Your Body mass index is 28.5 kg/m. If this is out of the aformentioned range listed, please consider follow up with your Primary Care Provider.   __________________________________________________________  The [facility] providers would like to encourage you to use [facility] to communicate with providers for non-urgent requests or questions.  Due to long hold times on the telephone, sending your provider a message by [facility] may be a faster and more efficient way to get a response.  Please allow 48 business hours for a response.  Please remember that this is for non-urgent requests.    Due to recent changes in healthcare laws, you may see the results of your imaging and laboratory studies on MyChart before your provider has had a chance to review them.  We understand that in some cases there may be results that are confusing or concerning to you. Not all laboratory results come back in the same time frame and the provider may be waiting for multiple results in order to interpret others.  Please give [place] 48 hours in order for your provider to thoroughly review all the results before contacting the office for clarification of your results.   You have been scheduled for a colonoscopy. Please follow written instructions given to you at your visit today.  Please pick up your prep supplies at the pharmacy within the next 1-3 days. If you use inhalers (even only as needed), please bring them with you on the day of your procedure.   We have given you samples of the following medication to take: Clenpiq  Thank you for choosing me and [facility].  Vito Cirigliano, D.O.

## 2021-09-13 ENCOUNTER — Other Ambulatory Visit: Payer: Self-pay | Admitting: Primary Care

## 2021-09-13 DIAGNOSIS — M10071 Idiopathic gout, right ankle and foot: Secondary | ICD-10-CM

## 2021-09-13 NOTE — Telephone Encounter (Signed)
Medication Refill - Medication: colchicine 0.6 MG tablet., losartan (COZAAR) 100 MG tablet , and hydrALAZINE (APRESOLINE) 25 MG tablet   Has the patient contacted their pharmacy? Yes.   Pt told to contact provider  Preferred Pharmacy (with phone number or street name):  [place] [facility] Phone:  [phone]  Fax:  [phone]     Has the patient been seen for an appointment in the last year OR does the patient have an upcoming appointment? Yes.    Agent: Please be advised that RX refills may take up to 3 business days. We ask that you follow-up with your pharmacy.

## 2021-09-14 ENCOUNTER — Other Ambulatory Visit: Payer: Self-pay

## 2021-09-14 MED ORDER — LOSARTAN POTASSIUM 100 MG PO TABS
100.0000 mg | ORAL_TABLET | Freq: Every day | ORAL | 0 refills | Status: DC
Start: 2021-09-14 — End: 2021-12-11
  Filled 2021-09-14: qty 30, 30d supply, fill #0

## 2021-09-14 MED ORDER — HYDRALAZINE HCL 25 MG PO TABS
25.0000 mg | ORAL_TABLET | Freq: Three times a day (TID) | ORAL | 0 refills | Status: DC
  Filled 2021-09-14: qty 90, 30d supply, fill #0

## 2021-09-14 NOTE — Telephone Encounter (Signed)
Requested medication (s) are due for refill today: unsure  Requested medication (s) are on the active medication list: yes  Last refill:  01/24/17 colchicine,07/29/21 losartan, hydralazine 07/28/21  Future visit scheduled: yes  Notes to clinic:  Unable to refill per protocol, cannot delegate.  Colchicine Patient not taking: Reported on 06/30/2020. Losartan and hydralazine were refilled by a different provider. Routing for review.     Requested Prescriptions  Pending Prescriptions Disp Refills   colchicine 0.6 MG tablet 90 tablet 3    Sig: TAKE 2 TABLETS BY MOUTH AT THE ONSET OF A GOUT ATTACK, MAY REPEAT 1 TABLET IN 2 HOURS IF SYMPTOMS PERSIST.     Endocrinology:  Gout Agents - colchicine Failed - 09/13/2021  3:36 PM      Failed - AST in normal range and within 360 days    AST  Date Value Ref Range Status  07/28/2021 12 (L) 15 - 41 U/L Final         Passed - Cr in normal range and within 360 days    Creat  Date Value Ref Range Status  11/21/2015 0.64 (L) 0.70 - 1.33 mg/dL Final    Comment:      For patients > or = 61 years of age: The upper reference limit for Creatinine is approximately 13% higher for people identified as African-American.      Creatinine, Ser  Date Value Ref Range Status  07/28/2021 0.66 0.61 - 1.24 mg/dL Final   Creatinine, Urine  Date Value Ref Range Status  11/21/2015 166 20 - 370 mg/dL Final         Passed - ALT in normal range and within 360 days    ALT  Date Value Ref Range Status  07/28/2021 10 0 - 44 U/L Final         Passed - Valid encounter within last 12 months    Recent Outpatient Visits           1 month ago Tobacco abuse   Ravine Juluis Mire P, NP   11 months ago Type 2 diabetes mellitus with other specified complication, without long-term current use of insulin ([place])   [place], [place], MD   1 year ago Type 2 diabetes mellitus with other specified  complication, without long-term current use of insulin ([place])   [place], [place], MD   1 year ago Hypertension associated with diabetes [facility])   [place], Jarome Matin, RPH-CPP   1 year ago Hypertension associated with diabetes [facility])   [place] Ausdall, Annie Main L, RPH-CPP              Passed - CBC within normal limits and completed in the last 12 months    WBC  Date Value Ref Range Status  07/28/2021 9.1 4.0 - 10.5 K/uL Final   RBC  Date Value Ref Range Status  07/28/2021 4.86 4.22 - 5.81 MIL/uL Final   Hemoglobin  Date Value Ref Range Status  07/28/2021 15.3 13.0 - 17.0 g/dL Final   HCT  Date Value Ref Range Status  07/28/2021 47.3 39.0 - 52.0 % Final   MCHC  Date Value Ref Range Status  07/28/2021 32.3 30.0 - 36.0 g/dL Final   [facility]  Date Value Ref Range Status  07/28/2021 31.5 26.0 - 34.0 pg Final   MCV  Date Value Ref  Range Status  07/28/2021 97.3 80.0 - 100.0 fL Final   No results found for: "PLTCOUNTKUC", "LABPLAT", "POCPLA" RDW  Date Value Ref Range Status  07/28/2021 12.3 11.5 - 15.5 % Final          losartan (COZAAR) 100 MG tablet 30 tablet 0    Sig: Take 1 tablet (100 mg total) by mouth daily.     Cardiovascular:  Angiotensin Receptor Blockers Failed - 09/13/2021  3:36 PM      Failed - K in normal range and within 180 days    Potassium  Date Value Ref Range Status  07/28/2021 3.4 (L) 3.5 - 5.1 mmol/L Final         Passed - Cr in normal range and within 180 days    Creat  Date Value Ref Range Status  11/21/2015 0.64 (L) 0.70 - 1.33 mg/dL Final    Comment:      For patients > or = 61 years of age: The upper reference limit for Creatinine is approximately 13% higher for people identified as African-American.      Creatinine, Ser  Date Value Ref Range Status  07/28/2021 0.66 0.61 - 1.24 mg/dL Final   Creatinine, Urine   Date Value Ref Range Status  11/21/2015 166 20 - 370 mg/dL Final         Passed - Patient is not pregnant      Passed - Last BP in normal range    BP Readings from Last 1 Encounters:  09/06/21 124/70         Passed - Valid encounter within last 6 months    Recent Outpatient Visits           1 month ago Tobacco abuse   [place] Kerin Perna, NP   11 months ago Type 2 diabetes mellitus with other specified complication, without long-term current use of insulin ([place])   [place], [place], MD   1 year ago Type 2 diabetes mellitus with other specified complication, without long-term current use of insulin ([place])   [place], Charlane Ferretti, MD   1 year ago Hypertension associated with diabetes [facility])   [place] Ausdall, Jarome Matin, RPH-CPP   1 year ago Hypertension associated with diabetes [facility])   [place] Ausdall, Stephen L, RPH-CPP               hydrALAZINE (APRESOLINE) 25 MG tablet 90 tablet 0    Sig: Take 1 tablet (25 mg total) by mouth every 8 (eight) hours.     Cardiovascular:  Vasodilators Failed - 09/13/2021  3:36 PM      Failed - PLT in normal range and within 360 days    Platelets  Date Value Ref Range Status  07/28/2021 135 (L) 150 - 400 K/uL Final         Failed - ANA Screen, Ifa, Serum in normal range and within 360 days    No results found for: "ANA", "ANATITER", "LABANTI"       Passed - HCT in normal range and within 360 days    HCT  Date Value Ref Range Status  07/28/2021 47.3 39.0 - 52.0 % Final         Passed - HGB in normal range and within 360 days    Hemoglobin  Date Value Ref Range Status  07/28/2021 15.3 13.0 -  17.0 g/dL Final         Passed - RBC in normal range and within 360 days    RBC  Date Value Ref Range Status  07/28/2021 4.86 4.22 - 5.81 MIL/uL Final          Passed - WBC in normal range and within 360 days    WBC  Date Value Ref Range Status  07/28/2021 9.1 4.0 - 10.5 K/uL Final         Passed - Last BP in normal range    BP Readings from Last 1 Encounters:  09/06/21 124/70         Passed - Valid encounter within last 12 months    Recent Outpatient Visits           1 month ago Tobacco abuse   [place], Michelle P, NP   11 months ago Type 2 diabetes mellitus with other specified complication, without long-term current use of insulin ([place])   [place], [place], MD   1 year ago Type 2 diabetes mellitus with other specified complication, without long-term current use of insulin ([place])   [place], [place], MD   1 year ago Hypertension associated with diabetes [facility])   [place], Jarome Matin, RPH-CPP   1 year ago Hypertension associated with diabetes [facility])   [place], RPH-CPP

## 2021-09-15 ENCOUNTER — Other Ambulatory Visit: Payer: Self-pay

## 2021-09-15 MED ORDER — COLCHICINE 0.6 MG PO TABS
ORAL_TABLET | ORAL | 0 refills | Status: DC
  Filled 2021-09-15: qty 90, 30d supply, fill #0

## 2021-09-18 ENCOUNTER — Other Ambulatory Visit: Payer: Self-pay

## 2021-09-19 ENCOUNTER — Other Ambulatory Visit: Payer: Self-pay

## 2021-10-17 ENCOUNTER — Encounter: Payer: Self-pay | Admitting: Gastroenterology

## 2021-10-17 ENCOUNTER — Ambulatory Visit (AMBULATORY_SURGERY_CENTER): Payer: Self-pay | Admitting: Gastroenterology

## 2021-10-17 VITALS — BP 136/76 | HR 84 | Temp 98.2°F | Resp 17 | Ht 75.0 in | Wt 228.0 lb

## 2021-10-17 DIAGNOSIS — D120 Benign neoplasm of cecum: Secondary | ICD-10-CM

## 2021-10-17 DIAGNOSIS — K640 First degree hemorrhoids: Secondary | ICD-10-CM

## 2021-10-17 DIAGNOSIS — R970 Elevated carcinoembryonic antigen [CEA]: Secondary | ICD-10-CM

## 2021-10-17 DIAGNOSIS — D125 Benign neoplasm of sigmoid colon: Secondary | ICD-10-CM

## 2021-10-17 DIAGNOSIS — Z1211 Encounter for screening for malignant neoplasm of colon: Secondary | ICD-10-CM

## 2021-10-17 DIAGNOSIS — K5730 Diverticulosis of large intestine without perforation or abscess without bleeding: Secondary | ICD-10-CM

## 2021-10-17 DIAGNOSIS — K5720 Diverticulitis of large intestine with perforation and abscess without bleeding: Secondary | ICD-10-CM

## 2021-10-17 DIAGNOSIS — D123 Benign neoplasm of transverse colon: Secondary | ICD-10-CM

## 2021-10-17 DIAGNOSIS — D124 Benign neoplasm of descending colon: Secondary | ICD-10-CM

## 2021-10-17 MED ORDER — SODIUM CHLORIDE 0.9 % IV SOLN: 500.0000 mL | Freq: Once | INTRAVENOUS | Status: AC

## 2021-10-17 NOTE — Patient Instructions (Signed)
YOU HAD AN ENDOSCOPIC PROCEDURE TODAY AT [facility]:   Refer to the procedure report that was given to you for any specific questions about what was found during the examination.  If the procedure report does not answer your questions, please call your gastroenterologist to clarify.  If you requested that your care partner not be given the details of your procedure findings, then the procedure report has been included in a sealed envelope for you to review at your convenience later.  YOU SHOULD EXPECT: Some feelings of bloating in the abdomen. Passage of more gas than usual.  Walking can help get rid of the air that was put into your GI tract during the procedure and reduce the bloating. If you had a lower endoscopy (such as a colonoscopy or flexible sigmoidoscopy) you may notice spotting of blood in your stool or on the toilet paper. If you underwent a bowel prep for your procedure, you may not have a normal bowel movement for a few days.  Please Note:  You might notice some irritation and congestion in your nose or some drainage.  This is from the oxygen used during your procedure.  There is no need for concern and it should clear up in a day or so.  SYMPTOMS TO REPORT IMMEDIATELY:  Following lower endoscopy (colonoscopy or flexible sigmoidoscopy):  Excessive amounts of blood in the stool  Significant tenderness or worsening of abdominal pains  Swelling of the abdomen that is new, acute  Fever of 100F or higher   For urgent or emergent issues, a gastroenterologist can be reached at any hour by calling [phone]. Do not use MyChart messaging for urgent concerns.    DIET:  We do recommend a small meal at first, but then you may proceed to your regular diet.  Drink plenty of fluids but you should avoid alcoholic beverages for 24 hours.  MEDICATIONS: Continue present medications.  Please see handouts given to you by your recovery nurse.  Return to the GI clinic as  needed.  Thank you for allowing [place] to provide for your healthcare needs today.  ACTIVITY:  You should plan to take it easy for the rest of today and you should NOT DRIVE or use heavy machinery until tomorrow (because of the sedation medicines used during the test).    FOLLOW UP: Our staff will call the number listed on your records the next business day following your procedure.  We will call around 7:15- 8:00 am to check on you and address any questions or concerns that you may have regarding the information given to you following your procedure. If we do not reach you, we will leave a message.     If any biopsies were taken you will be contacted by phone or by letter within the next 1-3 weeks.  Please call us at [phone] if you have not heard about the biopsies in 3 weeks.    SIGNATURES/CONFIDENTIALITY: You and/or your care partner have signed paperwork which will be entered into your electronic medical record.  These signatures attest to the fact that that the information above on your After Visit Summary has been reviewed and is understood.  Full responsibility of the confidentiality of this discharge information lies with you and/or your care-partner.

## 2021-10-17 NOTE — Op Note (Signed)
[facility] Patient Name: Donald Bender Procedure Date: 10/17/2021 9:26 AM MRN: [id number] Endoscopist: Gerrit Heck , MD Age: 61 Referring MD:  Date of Birth: September 16, 1960 Gender: Male Account #: [id number] Procedure:                Colonoscopy Indications:              Screening for colorectal malignant neoplasm, This                            is the patient's first colonoscopy.                           Incidental - Follow-up of diverticulitis. Admitted                            in July 2023 with initial episode of diverticulitis                            with contained perforation (abscess/phlegmon around                            sigmoid). Treated with IV Abx and transitioned to                            oral antibiotics. No return of index symptoms.                           CEA 7.1. Medicines:                Monitored Anesthesia Care Procedure:                Pre-Anesthesia Assessment:                           - Prior to the procedure, a History and Physical                            was performed, and patient medications and                            allergies were reviewed. The patient's tolerance of                            previous anesthesia was also reviewed. The risks                            and benefits of the procedure and the sedation                            options and risks were discussed with the patient.                            All questions were answered, and informed consent  was obtained. Prior Anticoagulants: The patient has                            taken no previous anticoagulant or antiplatelet                            agents. ASA Grade Assessment: III - A patient with                            severe systemic disease. After reviewing the risks                            and benefits, the patient was deemed in                            satisfactory condition to undergo the procedure.                            After obtaining informed consent, the colonoscope                            was passed under direct vision. Throughout the                            procedure, the patient's blood pressure, pulse, and                            oxygen saturations were monitored continuously. The                            Olympus CF-HQ190L [id number]) Colonoscope was                            introduced through the anus and advanced to the the                            cecum, identified by appendiceal orifice and                            ileocecal valve. The colonoscopy was performed                            without difficulty. The patient tolerated the                            procedure well. The quality of the bowel                            preparation was good. The ileocecal valve,                            appendiceal orifice, and rectum were photographed. Scope In: 9:42:11 AM Scope Out: 10:08:50 AM Scope Withdrawal Time: 0 hours 23 minutes 1 second  Total Procedure Duration: 0  hours 26 minutes 39 seconds  Findings:                 The perianal and digital rectal examinations were                            normal.                           A 6 mm polyp was found in the cecum, located at the                            edge of the ileocecal valve. This did not invade                            into the ileum. The polyp was sessile. The polyp                            was removed with a cold snare. Resection and                            retrieval were complete. Estimated blood loss was                            minimal.                           Two sessile polyps were found in the descending                            colon and transverse colon. The polyps were 4 to 8                            mm in size. These polyps were removed with a cold                            snare. Resection and retrieval were complete.                            Estimated blood loss was  minimal.                           There was a medium-sized lipoma, in the transverse                            colon.                           A 5 mm polyp was found in the sigmoid colon. The                            polyp was sessile and erythematous, located at 40  cm, in an area of dense diverticulosis and mucosal                            edema as below. The polyp was removed with a cold                            snare. Resection and retrieval were complete.                            Estimated blood loss was minimal.                           Multiple small and large-mouthed diverticula were                            found in the sigmoid colon.                           An area of mildly congested, erythematous mucosa                            was found in the sigmoid colon, located 35-45 cm                            from the anal verge. This was in an area of dense                            diverticulosis.                           Non-bleeding internal hemorrhoids were found during                            retroflexion. The hemorrhoids were small. Complications:            No immediate complications. Estimated Blood Loss:     Estimated blood loss was minimal. Impression:               - One 6 mm polyp in the cecum, removed with a cold                            snare. Resected and retrieved.                           - Two 4 to 8 mm polyps in the descending colon and                            in the transverse colon, removed with a cold snare.                            Resected and retrieved.                           - Medium-sized lipoma in the transverse colon.                           -  One 5 mm polyp in the sigmoid colon, removed with                            a cold snare. Resected and retrieved.                           - Diverticulosis in the sigmoid colon.                           - Congested mucosa in the sigmoid colon.                            - Non-bleeding internal hemorrhoids. Recommendation:           - Patient has a contact number available for                            emergencies. The signs and symptoms of potential                            delayed complications were discussed with the                            patient. Return to normal activities tomorrow.                            Written discharge instructions were provided to the                            patient.                           - Resume previous diet.                           - Continue present medications.                           - Await pathology results.                           - Repeat colonoscopy for surveillance based on                            pathology results.                           - Return to GI clinic PRN. Gerrit Heck, MD 10/17/2021 10:17:11 AM

## 2021-10-17 NOTE — Progress Notes (Signed)
GASTROENTEROLOGY PROCEDURE H&P NOTE   Primary Care Physician: Charlott Rakes, MD    Reason for Procedure:   Diverticulitis follow-up, elevated CEA, colon cancer screening  Plan:    Colonoscopy  Patient is appropriate for endoscopic procedure(s) in the ambulatory ([facility]) setting.  The nature of the procedure, as well as the risks, benefits, and alternatives were carefully and thoroughly reviewed with the patient. Ample time for discussion and questions allowed. The patient understood, was satisfied, and agreed to proceed.     HPI: Donald Bender is a 61 y.o. male who presents for Colonoscopy for follow-up of recent diverticulitis along with CRC screening.   Hospitalization 6/26-30 with acute diverticulitis with contained perforation (abscess/phlegmon around sigmoid).   - 07/25/2021: CT A/P: Severe descending/sigmoid diverticulosis with severe inflammation in the sigmoid colon and extensive pericolonic inflammatory stranding, consistent with perforated diverticulitis, small central phlegmon, 2.3 cm rim-enhancing fluid collection in the deep pelvis anterior to mid rectum. - Was treated with IV ABX and transition to Augmentin x 14 days.  General surgery consulted with plan for medical management and recommended outpatient colonoscopy. - Leukocytosis resolved on hospital admission - CEA 7.1  No prior colonoscopy.   Brother with hx of diverticulitis requring surgical resection. No known family history of CRC, GI malignancy, liver disease, pancreatic disease, or IBD.  Past Medical History:  Diagnosis Date   Diabetes mellitus without complication ([place])    Diverticulitis    Hypertension     Past Surgical History:  Procedure Laterality Date   NO PAST SURGERIES      Prior to Admission medications   Medication Sig Start Date End Date Taking? Authorizing Provider  aspirin EC 81 MG tablet Take 1 tablet (81 mg total) by mouth daily. 01/24/17  Yes McClung, Dionne Bucy, PA-C  colchicine  0.6 MG tablet TAKE 2 TABLETS BY MOUTH AT THE ONSET OF A GOUT ATTACK, MAY REPEAT 1 TABLET IN 2 HOURS IF SYMPTOMS PERSIST. 09/15/21  Yes Charlott Rakes, MD  empagliflozin (JARDIANCE) 25 MG TABS tablet Take 1 tablet (25 mg total) by mouth daily before breakfast. 08/14/21  Yes Kerin Perna, NP  hydrALAZINE (APRESOLINE) 25 MG tablet Take 1 tablet (25 mg total) by mouth every 8 (eight) hours. 09/14/21 10/19/21 Yes Charlott Rakes, MD  losartan (COZAAR) 100 MG tablet Take 1 tablet (100 mg total) by mouth daily. 09/14/21 10/19/21 Yes Charlott Rakes, MD  metFORMIN (GLUCOPHAGE) 1000 MG tablet Take 1 tablet (1,000 mg total) by mouth 2 (two) times daily with a meal. 08/14/21  Yes Kerin Perna, NP  amLODipine (NORVASC) 10 MG tablet Take 1 tablet (10 mg total) by mouth daily. Patient not taking: Reported on 07/25/2021 03/30/20   Charlott Rakes, MD  atorvastatin (LIPITOR) 40 MG tablet Take 1 tablet (40 mg total) by mouth daily. For cholesterol Patient not taking: Reported on 07/25/2021 03/30/20   Charlott Rakes, MD  hydroxypropyl methylcellulose / hypromellose (ISOPTO TEARS / GONIOVISC) 2.5 % ophthalmic solution Place 1 drop into both eyes 2 (two) times daily as needed for dry eyes. Patient not taking: Reported on 09/06/2021    [provider]  ibuprofen (ADVIL) 200 MG tablet Take 400 mg by mouth every 6 (six) hours as needed for headache or moderate pain. Patient not taking: Reported on 09/06/2021    [provider]  naproxen sodium (ALEVE) 220 MG tablet Take 440 mg by mouth 2 (two) times daily as needed (pain). Patient not taking: Reported on 09/06/2021    [provider]  nicotine (NICODERM CQ) 14 mg/24hr patch Place 1 patch (14 mg total) onto the skin daily. Patient not taking: Reported on 09/06/2021 08/14/21   Kerin Perna, NP  nicotine (NICODERM CQ) 21 mg/24hr patch Place 1 patch (21 mg total) onto the skin daily. Patient not taking: Reported on 09/06/2021 08/14/21   Kerin Perna, NP  nicotine (NICODERM CQ) 7 mg/24hr patch Place 1 patch (7 mg total) onto the skin daily. Patient not taking: Reported on 09/06/2021 08/14/21   Kerin Perna, NP  spironolactone (ALDACTONE) 50 MG tablet Take 1 tablet (50 mg total) by mouth daily. Patient not taking: Reported on 07/25/2021 10/05/20   Charlott Rakes, MD    Current Outpatient Medications  Medication Sig Dispense Refill   aspirin EC 81 MG tablet Take 1 tablet (81 mg total) by mouth daily. 30 tablet 5   colchicine 0.6 MG tablet TAKE 2 TABLETS BY MOUTH AT THE ONSET OF A GOUT ATTACK, MAY REPEAT 1 TABLET IN 2 HOURS IF SYMPTOMS PERSIST. 90 tablet 0   empagliflozin (JARDIANCE) 25 MG TABS tablet Take 1 tablet (25 mg total) by mouth daily before breakfast. 90 tablet 1   hydrALAZINE (APRESOLINE) 25 MG tablet Take 1 tablet (25 mg total) by mouth every 8 (eight) hours. 90 tablet 0   losartan (COZAAR) 100 MG tablet Take 1 tablet (100 mg total) by mouth daily. 30 tablet 0   metFORMIN (GLUCOPHAGE) 1000 MG tablet Take 1 tablet (1,000 mg total) by mouth 2 (two) times daily with a meal. 180 tablet 3   amLODipine (NORVASC) 10 MG tablet Take 1 tablet (10 mg total) by mouth daily. (Patient not taking: Reported on 07/25/2021) 30 tablet 6   atorvastatin (LIPITOR) 40 MG tablet Take 1 tablet (40 mg total) by mouth daily. For cholesterol (Patient not taking: Reported on 07/25/2021) 30 tablet 6   hydroxypropyl methylcellulose / hypromellose (ISOPTO TEARS / GONIOVISC) 2.5 % ophthalmic solution Place 1 drop into both eyes 2 (two) times daily as needed for dry eyes. (Patient not taking: Reported on 09/06/2021)     ibuprofen (ADVIL) 200 MG tablet Take 400 mg by mouth every 6 (six) hours as needed for headache or moderate pain. (Patient not taking: Reported on 09/06/2021)     naproxen sodium (ALEVE) 220 MG tablet Take 440 mg by mouth 2 (two) times daily as needed (pain). (Patient not taking: Reported on 09/06/2021)     nicotine (NICODERM CQ) 14 mg/24hr patch  Place 1 patch (14 mg total) onto the skin daily. (Patient not taking: Reported on 09/06/2021) 28 patch 0   nicotine (NICODERM CQ) 21 mg/24hr patch Place 1 patch (21 mg total) onto the skin daily. (Patient not taking: Reported on 09/06/2021) 28 patch 0   nicotine (NICODERM CQ) 7 mg/24hr patch Place 1 patch (7 mg total) onto the skin daily. (Patient not taking: Reported on 09/06/2021) 28 patch 0   spironolactone (ALDACTONE) 50 MG tablet Take 1 tablet (50 mg total) by mouth daily. (Patient not taking: Reported on 07/25/2021) 90 tablet 1   Current Facility-Administered Medications  Medication Dose Route Frequency Provider Last Rate Last Admin   0.9 %  sodium chloride infusion  500 mL Intravenous Once Dmarius Reeder V, DO        Allergies as of 10/17/2021   (No Known Allergies)    Family History  Problem Relation Age of Onset   Leukemia Mother 57   Diverticulitis Brother    Colon cancer Neg Hx    Stomach  cancer Neg Hx    Esophageal cancer Neg Hx    Colon polyps Neg Hx     Social History   Socioeconomic History   Marital status: Married    Spouse name: Not on file   Number of children: 0   Years of education: Not on file   Highest education level: Not on file  Occupational History   Occupation: Architect  Tobacco Use   Smoking status: Every Day    Packs/day: 1.00    Types: Cigarettes   Smokeless tobacco: Never   Tobacco comments:    Smoking 1 ppd  Vaping Use   Vaping Use: Never used  Substance and Sexual Activity   Alcohol use: Not Currently    Alcohol/week: 6.0 standard drinks of alcohol    Types: 6 Cans of beer per week    Comment: daily drinker   Drug use: No   Sexual activity: Not on file  Other Topics Concern   Not on file  Social History Narrative   Not on file   Social Determinants of Health   Financial Resource Strain: Not on file  Food Insecurity: Not on file  Transportation Needs: Not on file  Physical Activity: Not on file  Stress: Not on file  Social  Connections: Not on file  Intimate Partner Violence: Not on file    Physical Exam: Vital signs in last 24 hours: '@BP'$  (!) 164/71   Pulse 89   Temp 98.2 F (36.8 C)   Ht '6\' 3"'$  (1.905 m)   Wt 228 lb (103.4 kg)   SpO2 92%   BMI 28.50 kg/m  GEN: NAD EYE: Sclerae anicteric ENT: MMM CV: Non-tachycardic Pulm: CTA b/l GI: Soft, NT/ND NEURO:  Alert & Oriented x [place], DO [facility]   10/17/2021 9:32 AM

## 2021-10-17 NOTE — Progress Notes (Signed)
PT taken to PACU. Monitors in place. VSS. Report given to RN. 

## 2021-10-17 NOTE — Progress Notes (Signed)
Called to room to assist during endoscopic procedure.  Patient ID and intended procedure confirmed with present staff. Received instructions for my participation in the procedure from the performing physician.  

## 2021-10-18 ENCOUNTER — Telehealth: Payer: Self-pay

## 2021-10-18 NOTE — Telephone Encounter (Signed)
  Follow up Call-     10/17/2021    8:47 AM  Call back number  Post procedure Call Back phone  # [phone]  Permission to leave phone message Yes     Patient questions:  Do you have a fever, pain , or abdominal swelling? No. Pain Score  0 *  Have you tolerated food without any problems? Yes.    Have you been able to return to your normal activities? Yes.    Do you have any questions about your discharge instructions: Diet   No. Medications  No. Follow up visit  No.  Do you have questions or concerns about your Care? No.  Actions: * If pain score is 4 or above: No action needed, pain <4.

## 2021-11-01 ENCOUNTER — Encounter: Payer: Self-pay | Admitting: Gastroenterology

## 2021-11-30 ENCOUNTER — Emergency Department (HOSPITAL_COMMUNITY): Payer: Self-pay

## 2021-11-30 ENCOUNTER — Encounter (HOSPITAL_COMMUNITY): Payer: Self-pay | Admitting: Emergency Medicine

## 2021-11-30 ENCOUNTER — Other Ambulatory Visit: Payer: Self-pay

## 2021-11-30 ENCOUNTER — Inpatient Hospital Stay (HOSPITAL_COMMUNITY)
Admission: EM | Admit: 2021-11-30 | Discharge: 2021-12-05 | DRG: 872 | Disposition: A | Discharge status: Home / Self Care | Payer: Self-pay | Attending: Internal Medicine | Admitting: Internal Medicine

## 2021-11-30 DIAGNOSIS — R6520 Severe sepsis without septic shock: Secondary | ICD-10-CM | POA: Diagnosis present

## 2021-11-30 DIAGNOSIS — R3989 Other symptoms and signs involving the genitourinary system: Secondary | ICD-10-CM | POA: Diagnosis present

## 2021-11-30 DIAGNOSIS — F1021 Alcohol dependence, in remission: Secondary | ICD-10-CM | POA: Diagnosis present

## 2021-11-30 DIAGNOSIS — D751 Secondary polycythemia: Secondary | ICD-10-CM | POA: Diagnosis present

## 2021-11-30 DIAGNOSIS — J449 Chronic obstructive pulmonary disease, unspecified: Secondary | ICD-10-CM | POA: Diagnosis present

## 2021-11-30 DIAGNOSIS — D649 Anemia, unspecified: Secondary | ICD-10-CM | POA: Diagnosis present

## 2021-11-30 DIAGNOSIS — K5720 Diverticulitis of large intestine with perforation and abscess without bleeding: Secondary | ICD-10-CM | POA: Diagnosis present

## 2021-11-30 DIAGNOSIS — Z79899 Other long term (current) drug therapy: Secondary | ICD-10-CM

## 2021-11-30 DIAGNOSIS — N179 Acute kidney failure, unspecified: Secondary | ICD-10-CM | POA: Diagnosis present

## 2021-11-30 DIAGNOSIS — N321 Vesicointestinal fistula: Secondary | ICD-10-CM | POA: Diagnosis present

## 2021-11-30 DIAGNOSIS — E860 Dehydration: Secondary | ICD-10-CM | POA: Diagnosis present

## 2021-11-30 DIAGNOSIS — K4020 Bilateral inguinal hernia, without obstruction or gangrene, not specified as recurrent: Secondary | ICD-10-CM | POA: Diagnosis present

## 2021-11-30 DIAGNOSIS — E871 Hypo-osmolality and hyponatremia: Secondary | ICD-10-CM | POA: Diagnosis present

## 2021-11-30 DIAGNOSIS — E1165 Type 2 diabetes mellitus with hyperglycemia: Secondary | ICD-10-CM | POA: Insufficient documentation

## 2021-11-30 DIAGNOSIS — Z1152 Encounter for screening for COVID-19: Secondary | ICD-10-CM

## 2021-11-30 DIAGNOSIS — K5900 Constipation, unspecified: Secondary | ICD-10-CM | POA: Diagnosis present

## 2021-11-30 DIAGNOSIS — E876 Hypokalemia: Secondary | ICD-10-CM | POA: Insufficient documentation

## 2021-11-30 DIAGNOSIS — I10 Essential (primary) hypertension: Secondary | ICD-10-CM | POA: Insufficient documentation

## 2021-11-30 DIAGNOSIS — F17210 Nicotine dependence, cigarettes, uncomplicated: Secondary | ICD-10-CM | POA: Diagnosis present

## 2021-11-30 DIAGNOSIS — A419 Sepsis, unspecified organism: Principal | ICD-10-CM | POA: Diagnosis present

## 2021-11-30 DIAGNOSIS — N1330 Unspecified hydronephrosis: Secondary | ICD-10-CM | POA: Diagnosis present

## 2021-11-30 DIAGNOSIS — Z7984 Long term (current) use of oral hypoglycemic drugs: Secondary | ICD-10-CM

## 2021-11-30 DIAGNOSIS — M109 Gout, unspecified: Secondary | ICD-10-CM | POA: Diagnosis present

## 2021-11-30 DIAGNOSIS — D696 Thrombocytopenia, unspecified: Secondary | ICD-10-CM | POA: Diagnosis present

## 2021-11-30 DIAGNOSIS — Z7982 Long term (current) use of aspirin: Secondary | ICD-10-CM

## 2021-11-30 LAB — LACTIC ACID, PLASMA: Lactic Acid, Venous: 2.9 mmol/L (ref 0.5–1.9)

## 2021-11-30 LAB — URINALYSIS, ROUTINE W REFLEX MICROSCOPIC
Bilirubin Urine: NEGATIVE
Glucose, UA: NEGATIVE mg/dL
Ketones, ur: NEGATIVE mg/dL
Nitrite: NEGATIVE
Protein, ur: 100 mg/dL — AB
Specific Gravity, Urine: 1.015 (ref 1.005–1.030)
pH: 5 (ref 5.0–8.0)

## 2021-11-30 LAB — CBC WITH DIFFERENTIAL/PLATELET
Abs Immature Granulocytes: 0.13 10*3/uL — ABNORMAL HIGH (ref 0.00–0.07)
Basophils Absolute: 0.1 10*3/uL (ref 0.0–0.1)
Basophils Relative: 0 %
Eosinophils Absolute: 0 10*3/uL (ref 0.0–0.5)
Eosinophils Relative: 0 %
HCT: 34.1 % — ABNORMAL LOW (ref 39.0–52.0)
Hemoglobin: 11 g/dL — ABNORMAL LOW (ref 13.0–17.0)
Immature Granulocytes: 1 %
Lymphocytes Relative: 7 %
Lymphs Abs: 1.6 10*3/uL (ref 0.7–4.0)
MCH: 28.9 pg (ref 26.0–34.0)
MCHC: 32.3 g/dL (ref 30.0–36.0)
MCV: 89.7 fL (ref 80.0–100.0)
Monocytes Absolute: 1.6 10*3/uL — ABNORMAL HIGH (ref 0.1–1.0)
Monocytes Relative: 7 %
Neutro Abs: 20.9 10*3/uL — ABNORMAL HIGH (ref 1.7–7.7)
Neutrophils Relative %: 85 %
Platelets: 147 10*3/uL — ABNORMAL LOW (ref 150–400)
RBC: 3.8 MIL/uL — ABNORMAL LOW (ref 4.22–5.81)
RDW: 13.9 % (ref 11.5–15.5)
WBC: 24.4 10*3/uL — ABNORMAL HIGH (ref 4.0–10.5)
nRBC: 0 % (ref 0.0–0.2)

## 2021-11-30 LAB — COMPREHENSIVE METABOLIC PANEL
ALT: 9 U/L (ref 0–44)
AST: 18 U/L (ref 15–41)
Albumin: 2.7 g/dL — ABNORMAL LOW (ref 3.5–5.0)
Alkaline Phosphatase: 53 U/L (ref 38–126)
Anion gap: 15 (ref 5–15)
BUN: 33 mg/dL — ABNORMAL HIGH (ref 8–23)
CO2: 22 mmol/L (ref 22–32)
Calcium: 8.7 mg/dL — ABNORMAL LOW (ref 8.9–10.3)
Chloride: 97 mmol/L — ABNORMAL LOW (ref 98–111)
Creatinine, Ser: 2.06 mg/dL — ABNORMAL HIGH (ref 0.61–1.24)
GFR, Estimated: 36 mL/min — ABNORMAL LOW (ref >60)
Glucose, Bld: 175 mg/dL — ABNORMAL HIGH (ref 70–99)
Potassium: 3.8 mmol/L (ref 3.5–5.1)
Sodium: 134 mmol/L — ABNORMAL LOW (ref 135–145)
Total Bilirubin: 0.7 mg/dL (ref 0.3–1.2)
Total Protein: 7.3 g/dL (ref 6.5–8.1)

## 2021-11-30 MED ORDER — SODIUM CHLORIDE 0.9 % IV BOLUS
1000.0000 mL | Freq: Once | INTRAVENOUS | Status: AC
  Administered 2021-11-30: 1000 mL via INTRAVENOUS

## 2021-11-30 MED ORDER — PIPERACILLIN-TAZOBACTAM 3.375 G IVPB 30 MIN
3.3750 g | Freq: Once | INTRAVENOUS | Status: AC
  Administered 2021-11-30: 3.375 g via INTRAVENOUS
  Filled 2021-11-30: qty 50

## 2021-11-30 NOTE — ED Provider Triage Note (Addendum)
Emergency Medicine Provider Triage Evaluation Note  Donald Bender , a 61 y.o. male  was evaluated in triage.  Pt complains of body pain and abdominal pain, weakness.  Patient states that he has had significant symptoms since a colonoscopy in September.  Previously had diverticulitis but that symptoms feel different to him.  Over the past week he has had constipation, decreased appetite.  States that he has taken magnesium in order to have a bowel movement.    Review of Systems  Positive: Abdominal pain, constipation Negative: Vomiting  Physical Exam  BP (!) 103/52 (BP Location: Right Arm)   Pulse (!) 120   Temp 98.1 F (36.7 C) (Oral)   Resp 20   SpO2 96%  Gen:   Awake, no distress   Resp:  Normal effort MSK:   Moves extremities without difficulty  Other:  Tachycardia, generalized abdominal pain without rebound or guarding  Medical Decision Making  Medically screening exam initiated at 6:46 PM.  Appropriate orders placed.  [name] was informed that the remainder of the evaluation will be completed by another provider, this initial triage assessment does not replace that evaluation, and the importance of remaining in the ED until their evaluation is complete.  8:28 PM Patient brought back for cultures. I reassessed. Currently stable. Updated on results to this point. Awaiting non-contrast CT 2/2 AKI.      Carlisle Cater, PA-C 11/30/21 [place], PA-C 11/30/21 2029

## 2021-11-30 NOTE — ED Triage Notes (Signed)
Patient reports pain across abdomen with constipation for several months.

## 2021-11-30 NOTE — ED Provider Notes (Signed)
[facility] Provider Note   CSN: [id number] Arrival date & time: 11/30/21  1821     History  Chief Complaint  Patient presents with   Abdominal Pain    Donald Bender is a 61 y.o. male.  61 yo M with a cc of abdominal pain.  This has been going on for the past 5 months or so.  Found to initially had been seen in the hospital and was diagnosed with perforated diverticulitis.  He had had clinical improvement and did follow-up with a gastroenterologist and had a colonoscopy done.  According to him it was pretty unremarkable.  They found some polyps and give him a call and told him that they had come back okay.  He unfortunately has continued to have abdominal discomfort.  Is gotten progressively worse.  This morning he felt quite unwell and felt like he was a bit lightheaded.  Denies any nausea or vomiting.  Has had diffuse arthralgias that he attributes to gout.  Denies cough congestion or fever.   Abdominal Pain      Home Medications Prior to Admission medications   Medication Sig Start Date End Date Taking? Authorizing Provider  amLODipine (NORVASC) 10 MG tablet Take 1 tablet (10 mg total) by mouth daily. Patient not taking: Reported on 07/25/2021 03/30/20   Charlott Rakes, MD  aspirin EC 81 MG tablet Take 1 tablet (81 mg total) by mouth daily. 01/24/17   Argentina Donovan, PA-C  atorvastatin (LIPITOR) 40 MG tablet Take 1 tablet (40 mg total) by mouth daily. For cholesterol Patient not taking: Reported on 07/25/2021 03/30/20   Charlott Rakes, MD  colchicine 0.6 MG tablet TAKE 2 TABLETS BY MOUTH AT THE ONSET OF A GOUT ATTACK, MAY REPEAT 1 TABLET IN 2 HOURS IF SYMPTOMS PERSIST. 09/15/21   Charlott Rakes, MD  empagliflozin (JARDIANCE) 25 MG TABS tablet Take 1 tablet (25 mg total) by mouth daily before breakfast. 08/14/21   Kerin Perna, NP  hydrALAZINE (APRESOLINE) 25 MG tablet Take 1 tablet (25 mg total) by mouth every 8 (eight) hours. 09/14/21  10/19/21  Charlott Rakes, MD  hydroxypropyl methylcellulose / hypromellose (ISOPTO TEARS / GONIOVISC) 2.5 % ophthalmic solution Place 1 drop into both eyes 2 (two) times daily as needed for dry eyes. Patient not taking: Reported on 09/06/2021    [provider]  ibuprofen (ADVIL) 200 MG tablet Take 400 mg by mouth every 6 (six) hours as needed for headache or moderate pain. Patient not taking: Reported on 09/06/2021    [provider]  losartan (COZAAR) 100 MG tablet Take 1 tablet (100 mg total) by mouth daily. 09/14/21 10/19/21  Charlott Rakes, MD  metFORMIN (GLUCOPHAGE) 1000 MG tablet Take 1 tablet (1,000 mg total) by mouth 2 (two) times daily with a meal. 08/14/21   Kerin Perna, NP  naproxen sodium (ALEVE) 220 MG tablet Take 440 mg by mouth 2 (two) times daily as needed (pain). Patient not taking: Reported on 09/06/2021    [provider]  nicotine (NICODERM CQ) 14 mg/24hr patch Place 1 patch (14 mg total) onto the skin daily. Patient not taking: Reported on 09/06/2021 08/14/21   Kerin Perna, NP  nicotine (NICODERM CQ) 21 mg/24hr patch Place 1 patch (21 mg total) onto the skin daily. Patient not taking: Reported on 09/06/2021 08/14/21   Kerin Perna, NP  nicotine (NICODERM CQ) 7 mg/24hr patch Place 1 patch (7 mg total) onto the skin daily. Patient not  taking: Reported on 09/06/2021 08/14/21   Kerin Perna, NP  spironolactone (ALDACTONE) 50 MG tablet Take 1 tablet (50 mg total) by mouth daily. Patient not taking: Reported on 07/25/2021 10/05/20   Charlott Rakes, MD      Allergies    Patient has no known allergies.    Review of Systems   Review of Systems  Gastrointestinal:  Positive for abdominal pain.    Physical Exam Updated Vital Signs BP (!) 114/55   Pulse (!) 111   Temp 98.1 F (36.7 C) (Oral)   Resp 12   Ht '6\' 3"'$  (1.905 m)   Wt 97.5 kg   SpO2 91%   BMI 26.87 kg/m  Physical Exam Vitals and nursing note reviewed.  Constitutional:       Appearance: He is well-developed.  HENT:     Head: Normocephalic and atraumatic.  Eyes:     Pupils: Pupils are equal, round, and reactive to light.  Neck:     Vascular: No JVD.  Cardiovascular:     Rate and Rhythm: Normal rate and regular rhythm.     Heart sounds: No murmur heard.    No friction rub. No gallop.  Pulmonary:     Effort: No respiratory distress.     Breath sounds: No wheezing.  Abdominal:     General: There is no distension.     Tenderness: There is no abdominal tenderness. There is no guarding or rebound.     Comments: Mild diffuse abdominal discomfort.  Musculoskeletal:        General: Normal range of motion.     Cervical back: Normal range of motion and neck supple.  Skin:    Coloration: Skin is not pale.     Findings: No rash.  Neurological:     Mental Status: He is alert and oriented to person, place, and time.  Psychiatric:        Behavior: Behavior normal.     ED Results / Procedures / Treatments   Labs (all labs ordered are listed, but only abnormal results are displayed) Labs Reviewed  CBC WITH DIFFERENTIAL/PLATELET - Abnormal; Notable for the following components:      Result Value   WBC 24.4 (*)    RBC 3.80 (*)    Hemoglobin 11.0 (*)    HCT 34.1 (*)    Platelets 147 (*)    Neutro Abs 20.9 (*)    Monocytes Absolute 1.6 (*)    Abs Immature Granulocytes 0.13 (*)    All other components within normal limits  COMPREHENSIVE METABOLIC PANEL - Abnormal; Notable for the following components:   Sodium 134 (*)    Chloride 97 (*)    Glucose, Bld 175 (*)    BUN 33 (*)    Creatinine, Ser 2.06 (*)    Calcium 8.7 (*)    Albumin 2.7 (*)    GFR, Estimated 36 (*)    All other components within normal limits  URINALYSIS, ROUTINE W REFLEX MICROSCOPIC - Abnormal; Notable for the following components:   APPearance TURBID (*)    Hgb urine dipstick MODERATE (*)    Protein, ur 100 (*)    Leukocytes,Ua MODERATE (*)    All other components within normal  limits  LACTIC ACID, PLASMA - Abnormal; Notable for the following components:   Lactic Acid, Venous 2.9 (*)    All other components within normal limits  CULTURE, BLOOD (ROUTINE X 2)  CULTURE, BLOOD (ROUTINE X 2)    EKG EKG Interpretation  Date/Time:  Thursday November 30 2021 18:47:31 EDT Ventricular Rate:  114 PR Interval:  166 QRS Duration: 80 QT Interval:  326 QTC Calculation: 449 R Axis:   73 Text Interpretation: Sinus tachycardia Possible Anterior infarct , age undetermined Abnormal ECG No significant change since last tracing Confirmed by Deno Etienne [phone]) on 11/30/2021 11:09:44 PM  Radiology CT ABDOMEN PELVIS WO CONTRAST  Result Date: 11/30/2021 CLINICAL DATA:  Abdominal pain, acute, nonlocalized EXAM: CT ABDOMEN AND PELVIS WITHOUT CONTRAST TECHNIQUE: Multidetector CT imaging of the abdomen and pelvis was performed following the standard protocol without IV contrast. RADIATION DOSE REDUCTION: This exam was performed according to the departmental dose-optimization program which includes automated exposure control, adjustment of the mA and/or kV according to patient size and/or use of iterative reconstruction technique. COMPARISON:  07/25/2021 FINDINGS: Lower chest: Centrilobular ground-glass pulmonary infiltrate is seen within the visualized right lung base, possibly infectious or inflammatory in the acute setting. No pleural effusion. Moderate right coronary artery calcification. Hepatobiliary: Stable mild hepatomegaly. No intrahepatic mass identified on this noncontrast examination. No intra or extrahepatic biliary ductal dilation. Gallbladder unremarkable. Pancreas: Unremarkable Spleen: Unremarkable Adrenals/Urinary Tract: The adrenal glands are unremarkable. The kidneys are normal in size and position. Mild left hydronephrosis and perinephric inflammatory stranding has developed. No intrarenal or ureteral calculi. No hydronephrosis on the right. There is circumferential bladder wall  thickening and perivesicular inflammatory stranding as well as extensive gas within the bladder lumen. 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 The bladder is not distended. Stomach/Bowel: Severe descending and sigmoid colonic diverticulosis again noted. Extensive pericolonic inflammatory changes are again seen involving the sigmoid colon, slightly improved since prior examination. There has developed, however, punctate foci of extraluminal gas both between the dome of the bladder and the affected region of sigmoid colon, described above, as well as within the a pericolonic inflammatory collection best seen on axial image # 61/3 and coronal image # 53/6 this phlegmonous collection appears relatively poorly defined no discrete drainable fluid component is identified. The stomach, small bowel, and large bowel are otherwise unremarkable. No evidence of obstruction. No free intraperitoneal gas or fluid. Appendix normal. Vascular/Lymphatic: Aortic atherosclerosis. No enlarged abdominal or pelvic lymph nodes. Reproductive: Prostate is unremarkable. Other: Moderate right and small left inguinal hernias are present with the right inguinal hernia sac containing a loop of unremarkable distal small bowel and the left hernia sac containing fat as well as perivesicular inflammatory fluid. Musculoskeletal: No acute bone abnormality. Degenerative changes are seen within the lumbar spine. No lytic or blastic bone lesion. IMPRESSION: 1. Extensive inflammatory changes involving the sigmoid colon in a background of severe diverticulosis, slightly improved since prior examination and in keeping with subacute perforated diverticulitis. Evolving pericolonic phlegmon without discrete drainable fluid collection identified. 2. Interval development of a colovesicular fistula with  perivesicular inflammatory changes. Additionally, mild left hydronephrosis and left perinephric inflammatory changes have developed suggesting ascending left-sided infection. 3. Centrilobular ground-glass pulmonary infiltrate within the visualized right lung base, possibly infectious or inflammatory in the acute setting. 4. Stable mild hepatomegaly. 5. Moderate right coronary artery calcification. 6. Moderate right bowel containing and small left inguinal hernias. 7. Aortic atherosclerosis. Aortic Atherosclerosis (ICD10-I70.0). Electronically Signed   By: Fidela Salisbury M.D.   On: 11/30/2021 22:13   DG Chest 2 View  Result Date: 11/30/2021 CLINICAL DATA:  Dizziness, weakness EXAM: CHEST - 2 VIEW  COMPARISON:  07/25/2021 FINDINGS: Cardiac size is within normal limits. Increase in AP diameter of chest suggests COPD. There are no signs of pulmonary edema or focal pulmonary consolidation. There is interval decrease in pulmonary vascular congestion. There is no pleural effusion or pneumothorax. IMPRESSION: COPD. There are no signs of pulmonary edema or focal pulmonary consolidation. Electronically Signed   By: Elmer Picker M.D.   On: 11/30/2021 19:53    Procedures Procedures    Medications Ordered in ED Medications  piperacillin-tazobactam (ZOSYN) IVPB 3.375 g (3.375 g Intravenous New Bag/Given 11/30/21 2313)  sodium chloride 0.9 % bolus 1,000 mL (1,000 mLs Intravenous New Bag/Given 11/30/21 2312)    ED Course/ Medical Decision Making/ A&P                           Medical Decision Making Risk Prescription drug management.   61 yo M with a chief complaints of abdominal pain and feeling unwell.  Unfortunately this has been an ongoing issue for him.  He was seen at the onset of this back in June and was diagnosed with diverticulitis with perforation.  He had been seen by GI after that and had a colonoscopy performed.  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  His lab work here is significantly abnormal with a leukocytosis of 24,000 with a neutrophilic predominance.  He is dehydrated based on his blood work likely with a sodium of 134 and a chloride of 97 he has had some worsening of his baseline renal function.  Lactate of 3.  CT scan read by radiology as similar to obtained back in June, diverticulitis with perforation and phlegmon no obvious abscess or drainable fluid collection.  ? Colovesicular fistula.  Patient without obvious urinary symptoms.  UA also without obvious infection.  I discussed the case with Dr. Kieth Brightly.  Agreed with IV antibiotics and medical admission.  General surgery to see in the morning.  With patient's lab derangements will start on Zosyn per protocol.  Will discuss with medicine for admission.  The patients results and plan were reviewed and discussed.   Any x-rays performed were independently reviewed by myself.   Differential diagnosis were considered with the presenting HPI.  Medications  piperacillin-tazobactam (ZOSYN) IVPB 3.375 g (3.375 g Intravenous New Bag/Given 11/30/21 2313)  sodium chloride 0.9 % bolus 1,000 mL (1,000 mLs Intravenous New Bag/Given 11/30/21 2312)    Vitals:   11/30/21 1848 11/30/21 2255 11/30/21 2256 11/30/21 2330  BP: (!) 103/52  (!) 153/46 (!) 114/55  Pulse: (!) 120  (!) 109 (!) 111  Resp: '20  18 12  '$ Temp: 98.1 F (36.7 C)     TempSrc: Oral     SpO2: 96%  97% 91%  Weight:  97.5 kg    Height:  '6\' 3"'$  (1.905 m)      Final diagnoses:  Diverticulitis of colon with perforation    Admission/ observation were discussed with the admitting physician, patient and/or family and they are comfortable with the plan.         Final Clinical Impression(s) / ED Diagnoses Final diagnoses:  Diverticulitis of colon with perforation    Rx / DC Orders ED Discharge Orders     None         Deno Etienne, DO 11/30/21 2354

## 2021-12-01 ENCOUNTER — Encounter (HOSPITAL_COMMUNITY): Payer: Self-pay | Admitting: Internal Medicine

## 2021-12-01 DIAGNOSIS — E1165 Type 2 diabetes mellitus with hyperglycemia: Secondary | ICD-10-CM | POA: Insufficient documentation

## 2021-12-01 DIAGNOSIS — K5720 Diverticulitis of large intestine with perforation and abscess without bleeding: Secondary | ICD-10-CM

## 2021-12-01 DIAGNOSIS — I10 Essential (primary) hypertension: Secondary | ICD-10-CM | POA: Insufficient documentation

## 2021-12-01 LAB — COMPREHENSIVE METABOLIC PANEL
ALT: 10 U/L (ref 0–44)
AST: 18 U/L (ref 15–41)
Albumin: 2.3 g/dL — ABNORMAL LOW (ref 3.5–5.0)
Alkaline Phosphatase: 49 U/L (ref 38–126)
Anion gap: 11 (ref 5–15)
BUN: 37 mg/dL — ABNORMAL HIGH (ref 8–23)
CO2: 24 mmol/L (ref 22–32)
Calcium: 8.1 mg/dL — ABNORMAL LOW (ref 8.9–10.3)
Chloride: 99 mmol/L (ref 98–111)
Creatinine, Ser: 2.1 mg/dL — ABNORMAL HIGH (ref 0.61–1.24)
GFR, Estimated: 35 mL/min — ABNORMAL LOW (ref >60)
Glucose, Bld: 127 mg/dL — ABNORMAL HIGH (ref 70–99)
Potassium: 3.8 mmol/L (ref 3.5–5.1)
Sodium: 134 mmol/L — ABNORMAL LOW (ref 135–145)
Total Bilirubin: 0.8 mg/dL (ref 0.3–1.2)
Total Protein: 6.9 g/dL (ref 6.5–8.1)

## 2021-12-01 LAB — GLUCOSE, CAPILLARY: Glucose-Capillary: 110 mg/dL — ABNORMAL HIGH (ref 70–99)

## 2021-12-01 LAB — RETICULOCYTES
Immature Retic Fract: 16.6 % — ABNORMAL HIGH (ref 2.3–15.9)
RBC.: 3.66 MIL/uL — ABNORMAL LOW (ref 4.22–5.81)
Retic Count, Absolute: 32.2 10*3/uL (ref 19.0–186.0)
Retic Ct Pct: 0.9 % (ref 0.4–3.1)

## 2021-12-01 LAB — CBC
HCT: 31.6 % — ABNORMAL LOW (ref 39.0–52.0)
Hemoglobin: 9.9 g/dL — ABNORMAL LOW (ref 13.0–17.0)
MCH: 28.1 pg (ref 26.0–34.0)
MCHC: 31.3 g/dL (ref 30.0–36.0)
MCV: 89.8 fL (ref 80.0–100.0)
Platelets: UNDETERMINED 10*3/uL (ref 150–400)
RBC: 3.52 MIL/uL — ABNORMAL LOW (ref 4.22–5.81)
RDW: 14.1 % (ref 11.5–15.5)
WBC: 18.8 10*3/uL — ABNORMAL HIGH (ref 4.0–10.5)
nRBC: 0 % (ref 0.0–0.2)

## 2021-12-01 LAB — IRON AND TIBC
Iron: 13 ug/dL — ABNORMAL LOW (ref 45–182)
Saturation Ratios: 8 % — ABNORMAL LOW (ref 17.9–39.5)
TIBC: 174 ug/dL — ABNORMAL LOW (ref 250–450)
UIBC: 161 ug/dL

## 2021-12-01 LAB — CBG MONITORING, ED
Glucose-Capillary: 141 mg/dL — ABNORMAL HIGH (ref 70–99)
Glucose-Capillary: 139 mg/dL — ABNORMAL HIGH (ref 70–99)
Glucose-Capillary: 116 mg/dL — ABNORMAL HIGH (ref 70–99)

## 2021-12-01 LAB — URIC ACID: Uric Acid, Serum: 6.4 mg/dL (ref 3.7–8.6)

## 2021-12-01 LAB — FOLATE: Folate: 11.8 ng/mL (ref >5.9)

## 2021-12-01 LAB — SARS CORONAVIRUS 2 BY RT PCR: SARS Coronavirus 2 by RT PCR: NEGATIVE

## 2021-12-01 LAB — VITAMIN B12: Vitamin B-12: 187 pg/mL (ref 180–914)

## 2021-12-01 LAB — FERRITIN: Ferritin: 839 ng/mL — ABNORMAL HIGH (ref 24–336)

## 2021-12-01 MED ORDER — THIAMINE HCL 100 MG/ML IJ SOLN
100.0000 mg | Freq: Every day | INTRAMUSCULAR | Status: DC
  Administered 2021-12-01 – 2021-12-05 (×5): 100 mg via INTRAVENOUS
  Filled 2021-12-01 (×5): qty 2

## 2021-12-01 MED ORDER — HYDROMORPHONE HCL 1 MG/ML IJ SOLN
0.5000 mg | Freq: Once | INTRAMUSCULAR | Status: AC
  Administered 2021-12-01: 0.5 mg via INTRAVENOUS
  Filled 2021-12-01: qty 1

## 2021-12-01 MED ORDER — NICOTINE 21 MG/24HR TD PT24
21.0000 mg | MEDICATED_PATCH | Freq: Every day | TRANSDERMAL | Status: DC
  Administered 2021-12-01 – 2021-12-05 (×5): 21 mg via TRANSDERMAL
  Filled 2021-12-01 (×5): qty 1

## 2021-12-01 MED ORDER — HYDRALAZINE HCL 20 MG/ML IJ SOLN: 10.0000 mg | INTRAMUSCULAR | Status: DC | PRN

## 2021-12-01 MED ORDER — INSULIN ASPART 100 UNIT/ML IJ SOLN: 0.0000 [IU] | INTRAMUSCULAR | Status: DC

## 2021-12-01 MED ORDER — HYDROMORPHONE HCL 1 MG/ML IJ SOLN
0.5000 mg | INTRAMUSCULAR | Status: DC | PRN
  Administered 2021-12-02 (×2): 0.5 mg via INTRAVENOUS
  Filled 2021-12-01 (×2): qty 0.5

## 2021-12-01 MED ORDER — SODIUM CHLORIDE 0.9 % IV SOLN: INTRAVENOUS | Status: DC

## 2021-12-01 MED ORDER — PIPERACILLIN-TAZOBACTAM 3.375 G IVPB
3.3750 g | Freq: Three times a day (TID) | INTRAVENOUS | Status: DC
  Administered 2021-12-01 – 2021-12-05 (×13): 3.375 g via INTRAVENOUS
  Filled 2021-12-01 (×12): qty 50

## 2021-12-01 MED ORDER — FENTANYL CITRATE PF 50 MCG/ML IJ SOSY
25.0000 ug | PREFILLED_SYRINGE | INTRAMUSCULAR | Status: DC | PRN
  Administered 2021-12-01: 25 ug via INTRAVENOUS
  Filled 2021-12-01: qty 1

## 2021-12-01 MED ORDER — SODIUM CHLORIDE 0.9 % IV BOLUS
1000.0000 mL | Freq: Once | INTRAVENOUS | Status: AC
  Administered 2021-12-01: 1000 mL via INTRAVENOUS

## 2021-12-01 NOTE — ED Notes (Signed)
Pt asleep, SpO2 87% on room air while sleeping. Pt placed on 2L O2 via [place]. SpO2 98% on 2L. Pt appears in NAD. Respirations even, unlabored.

## 2021-12-01 NOTE — Consult Note (Signed)
[facility] Consult Note  Donald Bender 1961-01-16  [id number].    Requesting MD: Flora Lipps Chief Complaint/Reason for Consult: recurrent diverticulitis  HPI:  Donald Bender is a 61 y.o. male DM, HTN, hx gout, and tobacco abuse who who admitted to [facility] in June of this year with perforated diverticulitis. This was managed conservatively and resolved. He followed up with Dr. Bryan Lemma of GI and underwent colonoscopy on 10/17/21 which revealed multiple polyps that were tubular adenomas, a lipoma in the transverse colon, diverticulosis in the sigmoid colon, and congested mucosa in the sigmoid colon; he was advised to follow up in 3 years for repeat colonoscopy. States that since his colonoscopy he has had gradual worsening of his abdominal pain. Pain is diffuse about his abdomen. Denies nausea or vomiting. He intermittently goes between diarrhea and constipation. He reports having chills yesterday, no fever. He also had new onset pneumaturia yesterday. Denies dysuria.  CT scan yesterday reports extensive inflammatory changes involving the sigmoid colon in a background of severe diverticulosis; evolving pericolonic phlegmon without discrete drainable fluid collection; interval development of a colovesicular fistula with perivesicular inflammatory changes; mild left hydronephrosis and left perinephric inflammatory changes have developed suggesting ascending left-sided infection. Patient was started on IV zosyn and admitted to the medical service. General surgery asked to see.  Abdominal surgical history: none Anticoagulants: none Smokes 1 PPD Prior h/o heavy alcohol use, quit drinking in June 2023 Employment: retired Lives at home with girlfriend   Family History  Problem Relation Age of Onset   Leukemia Mother 18   Diverticulitis Brother    Colon cancer Neg Hx    Stomach cancer Neg Hx    Esophageal cancer Neg Hx    Colon polyps Neg Hx     Past Medical History:   Diagnosis Date   Diabetes mellitus without complication ([place])    Diverticulitis    Hypertension     Past Surgical History:  Procedure Laterality Date   NO PAST SURGERIES      Social History:  reports that he has been smoking cigarettes. He has been smoking an average of 1 pack per day. He has never used smokeless tobacco. He reports that he does not currently use alcohol after a past usage of about 6.0 standard drinks of alcohol per week. He reports that he does not use drugs.  Allergies: No Known Allergies  (Not in a hospital admission)   Prior to Admission medications   Medication Sig Start Date End Date Taking? Authorizing Provider  amLODipine (NORVASC) 10 MG tablet Take 1 tablet (10 mg total) by mouth daily. Patient not taking: Reported on 07/25/2021 03/30/20   Charlott Rakes, MD  aspirin EC 81 MG tablet Take 1 tablet (81 mg total) by mouth daily. 01/24/17   Argentina Donovan, PA-C  atorvastatin (LIPITOR) 40 MG tablet Take 1 tablet (40 mg total) by mouth daily. For cholesterol Patient not taking: Reported on 07/25/2021 03/30/20   Charlott Rakes, MD  colchicine 0.6 MG tablet TAKE 2 TABLETS BY MOUTH AT THE ONSET OF A GOUT ATTACK, MAY REPEAT 1 TABLET IN 2 HOURS IF SYMPTOMS PERSIST. 09/15/21   Charlott Rakes, MD  empagliflozin (JARDIANCE) 25 MG TABS tablet Take 1 tablet (25 mg total) by mouth daily before breakfast. 08/14/21   Kerin Perna, NP  hydrALAZINE (APRESOLINE) 25 MG tablet Take 1 tablet (25 mg total) by mouth every 8 (eight) hours. 09/14/21 10/19/21  Charlott Rakes, MD  hydroxypropyl methylcellulose / hypromellose (ISOPTO TEARS /  GONIOVISC) 2.5 % ophthalmic solution Place 1 drop into both eyes 2 (two) times daily as needed for dry eyes. Patient not taking: Reported on 09/06/2021    [provider]  ibuprofen (ADVIL) 200 MG tablet Take 400 mg by mouth every 6 (six) hours as needed for headache or moderate pain. Patient not taking: Reported on 09/06/2021    [provider]  losartan (COZAAR) 100 MG tablet Take 1 tablet (100 mg total) by mouth daily. 09/14/21 10/19/21  Charlott Rakes, MD  metFORMIN (GLUCOPHAGE) 1000 MG tablet Take 1 tablet (1,000 mg total) by mouth 2 (two) times daily with a meal. 08/14/21   Kerin Perna, NP  naproxen sodium (ALEVE) 220 MG tablet Take 440 mg by mouth 2 (two) times daily as needed (pain). Patient not taking: Reported on 09/06/2021    [provider]  nicotine (NICODERM CQ) 14 mg/24hr patch Place 1 patch (14 mg total) onto the skin daily. Patient not taking: Reported on 09/06/2021 08/14/21   Kerin Perna, NP  nicotine (NICODERM CQ) 21 mg/24hr patch Place 1 patch (21 mg total) onto the skin daily. Patient not taking: Reported on 09/06/2021 08/14/21   Kerin Perna, NP  nicotine (NICODERM CQ) 7 mg/24hr patch Place 1 patch (7 mg total) onto the skin daily. Patient not taking: Reported on 09/06/2021 08/14/21   Kerin Perna, NP  spironolactone (ALDACTONE) 50 MG tablet Take 1 tablet (50 mg total) by mouth daily. Patient not taking: Reported on 07/25/2021 10/05/20   Charlott Rakes, MD    Blood pressure (!) 99/58, pulse (!) 105, temperature 99.5 F (37.5 C), temperature source Oral, resp. rate (!) 22, height '6\' 3"'$  (1.905 m), weight 97.5 kg, SpO2 93 %. Physical Exam: General: pleasant, WD/WN male who is laying in bed in NAD HEENT: head is normocephalic, atraumatic.  Sclera are noninjected.  Pupils equal and round.  Ears and nose without any masses or lesions.  Mouth is pink and moist. Dentition fair Heart: mild tachycardia, regular rhythm Lungs: CTAB, no wheezes, rhonchi, or rales noted.  Respiratory effort nonlabored on room air Abd: soft, ND, +BS, no masses or organomegaly. Mild lower abdominal tenderness without rebound or guarding MS: no BUE/BLE edema, calves soft and nontender Skin: warm and dry with no masses, lesions, or rashes Psych: A&Ox4 with an appropriate affect Neuro: MAEs, no gross motor  or sensory deficits BUE/BLE  Results for orders placed or performed during the hospital encounter of 11/30/21 (from the past 48 hour(s))  Urinalysis, Routine w reflex microscopic Urine, Clean Catch     Status: Abnormal   Collection Time: 11/30/21  6:21 PM  Result Value Ref Range   Color, Urine YELLOW YELLOW   APPearance TURBID (A) CLEAR   Specific Gravity, Urine 1.015 1.005 - 1.030   pH 5.0 5.0 - 8.0   Glucose, UA NEGATIVE NEGATIVE mg/dL   Hgb urine dipstick MODERATE (A) NEGATIVE   Bilirubin Urine NEGATIVE NEGATIVE   Ketones, ur NEGATIVE NEGATIVE mg/dL   Protein, ur 100 (A) NEGATIVE mg/dL   Nitrite NEGATIVE NEGATIVE   Leukocytes,Ua MODERATE (A) NEGATIVE    Comment: Performed at [facility], [address], [place], [place] [address]  Lactic acid, plasma     Status: Abnormal   Collection Time: 11/30/21  6:54 PM  Result Value Ref Range   Lactic Acid, Venous 2.9 (HH) 0.5 - 1.9 mmol/L    Comment: CRITICAL RESULT CALLED TO, READ BACK BY AND VERIFIED WITH K.MUNNETT,RN '@1955'$  11/30/2021  [email or web address] Performed at [facility], [place] [address], [place], [place] [address]   CBC with Differential     Status: Abnormal   Collection Time: 11/30/21  6:58 PM  Result Value Ref Range   WBC 24.4 (H) 4.0 - 10.5 K/uL   RBC 3.80 (L) 4.22 - 5.81 MIL/uL   Hemoglobin 11.0 (L) 13.0 - 17.0 g/dL   HCT 34.1 (L) 39.0 - 52.0 %   MCV 89.7 80.0 - 100.0 fL   MCH 28.9 26.0 - 34.0 pg   MCHC 32.3 30.0 - 36.0 g/dL   RDW 13.9 11.5 - 15.5 %   Platelets 147 (L) 150 - 400 K/uL   nRBC 0.0 0.0 - 0.2 %   Neutrophils Relative % 85 %   Neutro Abs 20.9 (H) 1.7 - 7.7 K/uL   Lymphocytes Relative 7 %   Lymphs Abs 1.6 0.7 - 4.0 K/uL   Monocytes Relative 7 %   Monocytes Absolute 1.6 (H) 0.1 - 1.0 K/uL   Eosinophils Relative 0 %   Eosinophils Absolute 0.0 0.0 - 0.5 K/uL   Basophils Relative 0 %   Basophils Absolute 0.1 0.0 - 0.1 K/uL   Immature Granulocytes 1 %   Abs Immature Granulocytes 0.13 (H) 0.00 - 0.07 K/uL     Comment: Performed at [facility] [address] [place], [place] [address]  Comprehensive metabolic panel     Status: Abnormal   Collection Time: 11/30/21  6:58 PM  Result Value Ref Range   Sodium 134 (L) 135 - 145 mmol/L   Potassium 3.8 3.5 - 5.1 mmol/L   Chloride 97 (L) 98 - 111 mmol/L   CO2 22 22 - 32 mmol/L   Glucose, Bld 175 (H) 70 - 99 mg/dL    Comment: Glucose reference range applies only to samples taken after fasting for at least 8 hours.   BUN 33 (H) 8 - 23 mg/dL   Creatinine, Ser 2.06 (H) 0.61 - 1.24 mg/dL   Calcium 8.7 (L) 8.9 - 10.3 mg/dL   Total Protein 7.3 6.5 - 8.1 g/dL   Albumin 2.7 (L) 3.5 - 5.0 g/dL   AST 18 15 - 41 U/L   ALT 9 0 - 44 U/L   Alkaline Phosphatase 53 38 - 126 U/L   Total Bilirubin 0.7 0.3 - 1.2 mg/dL   GFR, Estimated 36 (L) >60 mL/min    Comment: (NOTE) Calculated using the CKD-EPI Creatinine Equation (2021)    Anion gap 15 5 - 15    Comment: Performed at [facility], [place] [address], [place], [place] [address]  Blood culture (routine x 2)     Status: None (Preliminary result)   Collection Time: 11/30/21  8:15 PM   Specimen: BLOOD RIGHT ARM  Result Value Ref Range   Specimen Description BLOOD RIGHT ARM    Special Requests      BOTTLES DRAWN AEROBIC AND ANAEROBIC Blood Culture results may not be optimal due to an excessive volume of blood received in culture bottles   Culture      NO GROWTH < 12 HOURS Performed at [facility] [address] [place],  [address]    Report Status PENDING   Blood culture (routine x 2)     Status: None (Preliminary result)   Collection Time: 11/30/21  8:38 PM   Specimen: BLOOD LEFT ARM  Result Value Ref Range   Specimen Description BLOOD LEFT ARM    Special Requests  BOTTLES DRAWN AEROBIC ONLY Blood Culture results may not be optimal due to an inadequate volume of blood received in culture bottles   Culture      NO GROWTH < 12 HOURS Performed at [facility] [address] [place], [place] [address]    Report Status PENDING   CBG monitoring, ED     Status: Abnormal   Collection Time: 12/01/21  3:30 AM  Result Value Ref Range   Glucose-Capillary 141 (H) 70 - 99 mg/dL    Comment: Glucose reference range applies only to samples taken after fasting for at least 8 hours.  Comprehensive metabolic panel     Status: Abnormal   Collection Time: 12/01/21  3:44 AM  Result Value Ref Range   Sodium 134 (L) 135 - 145 mmol/L   Potassium 3.8 3.5 - 5.1 mmol/L   Chloride 99 98 - 111 mmol/L   CO2 24 22 - 32 mmol/L   Glucose, Bld 127 (H) 70 - 99 mg/dL    Comment: Glucose reference range applies only to samples taken after fasting for at least 8 hours.   BUN 37 (H) 8 - 23 mg/dL   Creatinine, Ser 2.10 (H) 0.61 - 1.24 mg/dL   Calcium 8.1 (L) 8.9 - 10.3 mg/dL   Total Protein 6.9 6.5 - 8.1 g/dL   Albumin 2.3 (L) 3.5 - 5.0 g/dL   AST 18 15 - 41 U/L   ALT 10 0 - 44 U/L   Alkaline Phosphatase 49 38 - 126 U/L   Total Bilirubin 0.8 0.3 - 1.2 mg/dL   GFR, Estimated 35 (L) >60 mL/min    Comment: (NOTE) Calculated using the CKD-EPI Creatinine Equation (2021)    Anion gap 11 5 - 15    Comment: Performed at [facility], [place] [address] [place], [place] [address]  CBC     Status: Abnormal   Collection Time: 12/01/21  3:44 AM  Result Value Ref Range   WBC 18.8 (H) 4.0 - 10.5 K/uL   RBC 3.52 (L) 4.22 - 5.81 MIL/uL   Hemoglobin 9.9 (L) 13.0 - 17.0 g/dL   HCT 31.6 (L) 39.0 - 52.0 %   MCV 89.8 80.0 - 100.0 fL   MCH 28.1 26.0 - 34.0 pg   MCHC 31.3 30.0 - 36.0 g/dL   RDW 14.1 11.5 - 15.5 %   Platelets PLATELET CLUMPS NOTED ON SMEAR, UNABLE TO ESTIMATE 150 - 400 K/uL    Comment: PLATELET CLUMPS NOTED ON SMEAR, UNABLE TO ESTIMATE   nRBC 0.0 0.0 - 0.2 %    Comment: Performed at [facility], [place] [address] [place], [place] [address]  SARS Coronavirus 2 by RT PCR (hospital order, performed in [facility] hospital lab) *cepheid single result test* Anterior  Nasal Swab     Status: None   Collection Time: 12/01/21  5:00 AM   Specimen: Anterior Nasal Swab  Result Value Ref Range   SARS Coronavirus 2 by RT PCR NEGATIVE NEGATIVE    Comment: (NOTE) SARS-CoV-2 target nucleic acids are NOT DETECTED.  The SARS-CoV-2 RNA is generally detectable in upper and lower respiratory specimens during the acute phase of infection. The lowest concentration of SARS-CoV-2 viral copies this assay can detect is 250 copies / mL. A negative result does not preclude SARS-CoV-2 infection and should not be used as the sole basis for treatment or other patient management decisions.  A negative result may occur with improper specimen collection / handling,  submission of specimen other than nasopharyngeal swab, presence of viral mutation(s) within the areas targeted by this assay, and inadequate number of viral copies (<250 copies / mL). A negative result must be combined with clinical observations, patient history, and epidemiological information.  Fact Sheet for Patients:   https://www.patel.info/  Fact Sheet for Healthcare Providers: https://hall.com/  This test is not yet approved or  cleared by the [place] FDA and has been authorized for detection and/or diagnosis of SARS-CoV-2 by FDA under an Emergency Use Authorization (EUA).  This EUA will remain in effect (meaning this test can be used) for the duration of the COVID-19 declaration under Section 564(b)(1) of the Act, 21 U.S.C. section 360bbb-3(b)(1), unless the authorization is terminated or revoked sooner.  Performed at [facility], [place] [address], [place], [place] [address]   Vitamin B12     Status: None   Collection Time: 12/01/21  5:30 AM  Result Value Ref Range   Vitamin B-12 187 180 - 914 pg/mL    Comment: (NOTE) This assay is not validated for testing neonatal or myeloproliferative syndrome specimens for Vitamin B12 levels. Performed at  [facility], [place] [address] [place], [place] [address]   Folate     Status: None   Collection Time: 12/01/21  5:30 AM  Result Value Ref Range   Folate 11.8 >5.9 ng/mL    Comment: Performed at [facility] [address], [place], [place] [address]  Iron and TIBC     Status: Abnormal   Collection Time: 12/01/21  5:30 AM  Result Value Ref Range   Iron 13 (L) 45 - 182 ug/dL   TIBC 174 (L) 250 - 450 ug/dL   Saturation Ratios 8 (L) 17.9 - 39.5 %   UIBC 161 ug/dL    Comment: Performed at [facility], [place] [address] [place], [place] [address]  Ferritin     Status: Abnormal   Collection Time: 12/01/21  5:30 AM  Result Value Ref Range   Ferritin 839 (H) 24 - 336 ng/mL    Comment: Performed at [facility], [place] [address], [place], [place] [address]  Uric acid     Status: None   Collection Time: 12/01/21  5:30 AM  Result Value Ref Range   Uric Acid, Serum 6.4 3.7 - 8.6 mg/dL    Comment: Performed at [facility] [address] [place], [place] [address]  Reticulocytes     Status: Abnormal   Collection Time: 12/01/21  5:32 AM  Result Value Ref Range   Retic Ct Pct 0.9 0.4 - 3.1 %   RBC. 3.66 (L) 4.22 - 5.81 MIL/uL   Retic Count, Absolute 32.2 19.0 - 186.0 K/uL   Immature Retic Fract 16.6 (H) 2.3 - 15.9 %    Comment: Performed at [facility] [address] [place], [place] [address]  CBG monitoring, ED     Status: Abnormal   Collection Time: 12/01/21  8:17 AM  Result Value Ref Range   Glucose-Capillary 139 (H) 70 - 99 mg/dL    Comment: Glucose reference range applies only to samples taken after fasting for at least 8 hours.   CT ABDOMEN PELVIS WO CONTRAST  Result Date: 11/30/2021 CLINICAL DATA:  Abdominal pain, acute, nonlocalized EXAM: CT ABDOMEN AND PELVIS WITHOUT CONTRAST TECHNIQUE: Multidetector CT imaging of the abdomen and pelvis was performed following the standard protocol without IV contrast. RADIATION DOSE REDUCTION: This exam  was  performed according to the departmental dose-optimization program which includes automated exposure control, adjustment of the mA and/or kV according to patient size and/or use of iterative reconstruction technique. COMPARISON:  07/25/2021 FINDINGS: Lower chest: Centrilobular ground-glass pulmonary infiltrate is seen within the visualized right lung base, possibly infectious or inflammatory in the acute setting. No pleural effusion. Moderate right coronary artery calcification. Hepatobiliary: Stable mild hepatomegaly. No intrahepatic mass identified on this noncontrast examination. No intra or extrahepatic biliary ductal dilation. Gallbladder unremarkable. Pancreas: Unremarkable Spleen: Unremarkable Adrenals/Urinary Tract: The adrenal glands are unremarkable. The kidneys are normal in size and position. Mild left hydronephrosis and perinephric inflammatory stranding has developed. No intrarenal or ureteral calculi. No hydronephrosis on the right. There is circumferential bladder wall thickening and perivesicular inflammatory stranding as well as extensive gas within the bladder lumen. 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 The bladder is not distended. Stomach/Bowel: Severe descending and sigmoid colonic diverticulosis again noted. Extensive pericolonic inflammatory changes are again seen involving the sigmoid colon, slightly improved since prior examination. There has developed, however, punctate foci of extraluminal gas both between the dome of the bladder and the affected region of sigmoid colon, described above, as well as within the a pericolonic inflammatory collection best seen on axial image # 61/3 and coronal image # 53/6 this phlegmonous collection appears relatively poorly defined no discrete drainable fluid component is identified. The stomach, small  bowel, and large bowel are otherwise unremarkable. No evidence of obstruction. No free intraperitoneal gas or fluid. Appendix normal. Vascular/Lymphatic: Aortic atherosclerosis. No enlarged abdominal or pelvic lymph nodes. Reproductive: Prostate is unremarkable. Other: Moderate right and small left inguinal hernias are present with the right inguinal hernia sac containing a loop of unremarkable distal small bowel and the left hernia sac containing fat as well as perivesicular inflammatory fluid. Musculoskeletal: No acute bone abnormality. Degenerative changes are seen within the lumbar spine. No lytic or blastic bone lesion. IMPRESSION: 1. Extensive inflammatory changes involving the sigmoid colon in a background of severe diverticulosis, slightly improved since prior examination and in keeping with subacute perforated diverticulitis. Evolving pericolonic phlegmon without discrete drainable fluid collection identified. 2. Interval development of a colovesicular fistula with perivesicular inflammatory changes. Additionally, mild left hydronephrosis and left perinephric inflammatory changes have developed suggesting ascending left-sided infection. 3. Centrilobular ground-glass pulmonary infiltrate within the visualized right lung base, possibly infectious or inflammatory in the acute setting. 4. Stable mild hepatomegaly. 5. Moderate right coronary artery calcification. 6. Moderate right bowel containing and small left inguinal hernias. 7. Aortic atherosclerosis. Aortic Atherosclerosis (ICD10-I70.0). Electronically Signed   By: Fidela Salisbury M.D.   On: 11/30/2021 22:13   DG Chest 2 View  Result Date: 11/30/2021 CLINICAL DATA:  Dizziness, weakness EXAM: CHEST - 2 VIEW COMPARISON:  07/25/2021 FINDINGS: Cardiac size is within normal limits. Increase in AP diameter of chest suggests COPD. There are no signs of pulmonary edema or focal pulmonary consolidation. There is interval decrease in pulmonary vascular  congestion. There is no pleural effusion or pneumothorax. IMPRESSION: COPD. There are no signs of pulmonary edema or focal pulmonary consolidation. Electronically Signed   By: Elmer Picker M.D.   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, patient was admitted in June 2023 and managed conservatively - outpatient colonoscopy 10/17/21 which revealed multiple polyps that were tubular adenomas, a lipoma in the transverse colon, diverticulosis in the sigmoid colon, and congested mucosa in the sigmoid colon; he was advised to follow up in 3 years for repeat colonoscopy - returns 11/2, CT with extensive inflammatory changes involving the sigmoid colon in a background of severe diverticulosis; evolving pericolonic phlegmon without discrete drainable fluid collection; interval development of a colovesicular fistula with perivesicular inflammatory changes; mild left hydronephrosis and left perinephric inflammatory changes have developed suggesting ascending left-sided infection - No indication for acute surgical intervention. Will attempt nonoperative management with IV antibiotics and bowel rest. Ok for sips of clears. If this resolves conservatively would recommend outpatient follow up with one of our colorectal surgeons to discuss elective surgery. We will follow.  ID - zosyn VTE - SCDs, ok for chemical dvt ppx from surgical standpoint FEN - IVF, sips Foley - none  AKI Left hydronephrosis Pulmonary centrilobular  groundglass infiltrate in the right lung base  Anemia Thrombocytopenia DM HTN Hx gout Tobacco abuse  Prior alcohol abuse  Bilateral inguinal hernias  I reviewed ED provider notes, last 24 h vitals and pain scores, last 48 h intake and output, last 24 h labs and trends, and last 24 h imaging results.   Wellington Hampshire, [facility] 12/01/2021, 9:16 AM Please see Amion for pager number during day hours 7:00am-4:30pm

## 2021-12-01 NOTE — Progress Notes (Signed)
Pharmacy Antibiotic Note  Donald Bender is a 61 y.o. male admitted on 11/30/2021 with  intra-abdominal infection .  Pharmacy has been consulted for Zosyn dosing. WBC is elevated. Noted elevated Scr.   Plan: Zosyn 3.375G IV q8h to be infused over 4 hours Trend WBC, temp, renal function  F/U infectious work-up   Height: '6\' 3"'$  (190.5 cm) Weight: 97.5 kg (215 lb) IBW/kg (Calculated) : 84.5  Temp (24hrs), Avg:99 F (37.2 C), Min:98.1 F (36.7 C), Max:99.8 F (37.7 C)  Recent Labs  Lab 11/30/21 1854 11/30/21 1858  WBC  --  24.4*  CREATININE  --  2.06*  LATICACIDVEN 2.9*  --     Estimated Creatinine Clearance: 45 mL/min (A) (by C-G formula based on SCr of 2.06 mg/dL (H)).    No Known Allergies  Narda Bonds, PharmD, BCPS Clinical Pharmacist Phone: [phone]

## 2021-12-01 NOTE — Hospital Course (Signed)
Donald Bender is a 61 y.o. male with past medical history of type 2 diabetes, hypertension, gout and history of perforated diverticulitis which was managed conservatively in June patient hospital this time with abdominal pain several months which was persistent.  Of note patient had a colonoscopy in September which showed diverticulosis and polyps and lipoma which was removed.  In the ED CT scan done this time showed a subacute perforated diverticulitis of the sigmoid colon with possible developing phlegmon and features concerning for colovesical fistula with perinephric stranding.  Patient also had leukocytosis and AKI with anemia.  Was started on IV empiric antibiotic after obtaining cultures.  Chest x-ray showed COPD.  General surgery was consulted and patient was admitted hospital for further evaluation and treatment.     Perforated sigmoid diverticulitis with possible developing phlegmon possible colovesical fistula. General surgery has been consulted.  Continue n.p.o. IV fluids.  Follow-up plan as per-General surgery.  Blood cultures negative in less than 12 hours.  She did have significant leukocytosis.  Possible ascending infection involving the urinary tract with perinephric stranding on scan.  Continue empiric antibiotic.  Blood cultures negative less than 12 hours.  Pulmonary centrilobular groundglass infiltrate in the right lung base.  Seen on CT scan of the abdomen. On empiric antibiotic.  COVID 19 was negative.  Acute renal failure mild hyponatremia  Creatinine on presentation at 2.6.  Has trended down to 2.1 this morning.  Continue to follow BMP.  Creatinine on 07/28/2021 at 0.6.    Anemia  Appears to be new.  Had a colonoscopy in September with Diverticulosis and polyps were removed.  Vitamin B12 at 187 and borderline low.  Folic acid 60.6 within normal range.  Iron panel shows serum iron low at 13 with low TIBC.  Ferritin elevated at 879 but patient has acute  infection.  Thrombocytopenia  Patient admitted for sepsis and previous history of alcoholism.  Continue to monitor.  Chronic.  Prior history of alcoholism  Has not drank since June 2020   Diabetes mellitus type 2  Latest hemoglobin A1c of 8.0.  Sliding scale insulin for now.  History of hypertension  Currently NPO.  IV hydralazine.    History of gout Complains of pain over the joints.  Uric acid at 6.4.

## 2021-12-01 NOTE — ED Notes (Signed)
ED TO INPATIENT HANDOFF REPORT  ED Nurse Name and Phone #: Iona Coach Name/Age/Gender Donald Bender 61 y.o. male Room/Bed: 032C/032C  Code Status   Code Status: Full Code  Home/SNF/Other Home Patient oriented to: self, place, time, and situation Is this baseline? Yes   Triage Complete: Triage complete  Chief Complaint Diverticulitis of colon with perforation [K57.20]  Triage Note Patient reports pain across abdomen with constipation for several months.    Allergies No Known Allergies  Level of Care/Admitting Diagnosis ED Disposition     ED Disposition  Admit   Condition  --   Comment  Hospital Area: [facility] [[id number]]  Level of Care: Telemetry Medical [104]  May admit patient to [facility] or [facility] if equivalent level of care is available:: No  Covid Evaluation: Asymptomatic - no recent exposure (last 10 days) testing not required  Diagnosis: Diverticulitis of colon with perforation [409811]  Admitting Physician: Rise Patience [phone]  Attending Physician: Rise Patience [[id number]]  Certification:: I certify this patient will need inpatient services for at least 2 midnights          B Medical/Surgery History Past Medical History:  Diagnosis Date   Diabetes mellitus without complication ([place])    Diverticulitis    Hypertension    Past Surgical History:  Procedure Laterality Date   NO PAST SURGERIES       A IV Location/Drains/Wounds Patient Lines/Drains/Airways Status     Active Line/Drains/Airways     Name Placement date Placement time Site Days   Peripheral IV 11/30/21 20 G Left Antecubital 11/30/21  2312  Antecubital  1            Intake/Output Last 24 hours  Intake/Output Summary (Last 24 hours) at 12/01/2021 1306 Last data filed at 12/01/2021 6213 Gross per 24 hour  Intake 1050 ml  Output --  Net 1050 ml    Labs/Imaging Results for orders placed or performed during the hospital encounter of  11/30/21 (from the past 48 hour(s))  Urinalysis, Routine w reflex microscopic Urine, Clean Catch     Status: Abnormal   Collection Time: 11/30/21  6:21 PM  Result Value Ref Range   Color, Urine YELLOW YELLOW   APPearance TURBID (A) CLEAR   Specific Gravity, Urine 1.015 1.005 - 1.030   pH 5.0 5.0 - 8.0   Glucose, UA NEGATIVE NEGATIVE mg/dL   Hgb urine dipstick MODERATE (A) NEGATIVE   Bilirubin Urine NEGATIVE NEGATIVE   Ketones, ur NEGATIVE NEGATIVE mg/dL   Protein, ur 100 (A) NEGATIVE mg/dL   Nitrite NEGATIVE NEGATIVE   Leukocytes,Ua MODERATE (A) NEGATIVE    Comment: Performed at [facility], [address], [place], [place] [address]  Lactic acid, plasma     Status: Abnormal   Collection Time: 11/30/21  6:54 PM  Result Value Ref Range   Lactic Acid, Venous 2.9 (HH) 0.5 - 1.9 mmol/L    Comment: CRITICAL RESULT CALLED TO, READ BACK BY AND VERIFIED WITH K.MUNNETT,RN '@1955'$  11/30/2021 [email or web address].J Performed at [facility], [place] [address] [place], [place] [address]   CBC with Differential     Status: Abnormal   Collection Time: 11/30/21  6:58 PM  Result Value Ref Range   WBC 24.4 (H) 4.0 - 10.5 K/uL   RBC 3.80 (L) 4.22 - 5.81 MIL/uL   Hemoglobin 11.0 (L) 13.0 - 17.0 g/dL   HCT 34.1 (L) 39.0 - 52.0 %   MCV 89.7  80.0 - 100.0 fL   MCH 28.9 26.0 - 34.0 pg   MCHC 32.3 30.0 - 36.0 g/dL   RDW 13.9 11.5 - 15.5 %   Platelets 147 (L) 150 - 400 K/uL   nRBC 0.0 0.0 - 0.2 %   Neutrophils Relative % 85 %   Neutro Abs 20.9 (H) 1.7 - 7.7 K/uL   Lymphocytes Relative 7 %   Lymphs Abs 1.6 0.7 - 4.0 K/uL   Monocytes Relative 7 %   Monocytes Absolute 1.6 (H) 0.1 - 1.0 K/uL   Eosinophils Relative 0 %   Eosinophils Absolute 0.0 0.0 - 0.5 K/uL   Basophils Relative 0 %   Basophils Absolute 0.1 0.0 - 0.1 K/uL   Immature Granulocytes 1 %   Abs Immature Granulocytes 0.13 (H) 0.00 - 0.07 K/uL    Comment: Performed at [facility] [address] [place], [place] [address]  Comprehensive  metabolic panel     Status: Abnormal   Collection Time: 11/30/21  6:58 PM  Result Value Ref Range   Sodium 134 (L) 135 - 145 mmol/L   Potassium 3.8 3.5 - 5.1 mmol/L   Chloride 97 (L) 98 - 111 mmol/L   CO2 22 22 - 32 mmol/L   Glucose, Bld 175 (H) 70 - 99 mg/dL    Comment: Glucose reference range applies only to samples taken after fasting for at least 8 hours.   BUN 33 (H) 8 - 23 mg/dL   Creatinine, Ser 2.06 (H) 0.61 - 1.24 mg/dL   Calcium 8.7 (L) 8.9 - 10.3 mg/dL   Total Protein 7.3 6.5 - 8.1 g/dL   Albumin 2.7 (L) 3.5 - 5.0 g/dL   AST 18 15 - 41 U/L   ALT 9 0 - 44 U/L   Alkaline Phosphatase 53 38 - 126 U/L   Total Bilirubin 0.7 0.3 - 1.2 mg/dL   GFR, Estimated 36 (L) >60 mL/min    Comment: (NOTE) Calculated using the CKD-EPI Creatinine Equation (2021)    Anion gap 15 5 - 15    Comment: Performed at [facility], [place] [address] [place], [place] [address]  Blood culture (routine x 2)     Status: None (Preliminary result)   Collection Time: 11/30/21  8:15 PM   Specimen: BLOOD RIGHT ARM  Result Value Ref Range   Specimen Description BLOOD RIGHT ARM    Special Requests      BOTTLES DRAWN AEROBIC AND ANAEROBIC Blood Culture results may not be optimal due to an excessive volume of blood received in culture bottles   Culture      NO GROWTH < 12 HOURS Performed at [facility] [address] [place], [place] [address]    Report Status PENDING   Blood culture (routine x 2)     Status: None (Preliminary result)   Collection Time: 11/30/21  8:38 PM   Specimen: BLOOD LEFT ARM  Result Value Ref Range   Specimen Description BLOOD LEFT ARM    Special Requests      BOTTLES DRAWN AEROBIC ONLY Blood Culture results may not be optimal due to an inadequate volume of blood received in culture bottles   Culture      NO GROWTH < 12 HOURS Performed at [facility], [place] [address], [place], [place] [address]    Report Status PENDING   CBG monitoring, ED     Status:  Abnormal   Collection Time: 12/01/21  3:30 AM  Result Value Ref Range   Glucose-Capillary 141 (H) 70 - 99 mg/dL    Comment: Glucose reference range applies only to samples taken after fasting for at least 8 hours.  Comprehensive metabolic panel     Status: Abnormal   Collection Time: 12/01/21  3:44 AM  Result Value Ref Range   Sodium 134 (L) 135 - 145 mmol/L   Potassium 3.8 3.5 - 5.1 mmol/L   Chloride 99 98 - 111 mmol/L   CO2 24 22 - 32 mmol/L   Glucose, Bld 127 (H) 70 - 99 mg/dL    Comment: Glucose reference range applies only to samples taken after fasting for at least 8 hours.   BUN 37 (H) 8 - 23 mg/dL   Creatinine, Ser 2.10 (H) 0.61 - 1.24 mg/dL   Calcium 8.1 (L) 8.9 - 10.3 mg/dL   Total Protein 6.9 6.5 - 8.1 g/dL   Albumin 2.3 (L) 3.5 - 5.0 g/dL   AST 18 15 - 41 U/L   ALT 10 0 - 44 U/L   Alkaline Phosphatase 49 38 - 126 U/L   Total Bilirubin 0.8 0.3 - 1.2 mg/dL   GFR, Estimated 35 (L) >60 mL/min    Comment: (NOTE) Calculated using the CKD-EPI Creatinine Equation (2021)    Anion gap 11 5 - 15    Comment: Performed at [facility], [place] [address], [place], [place] [address]  CBC     Status: Abnormal   Collection Time: 12/01/21  3:44 AM  Result Value Ref Range   WBC 18.8 (H) 4.0 - 10.5 K/uL   RBC 3.52 (L) 4.22 - 5.81 MIL/uL   Hemoglobin 9.9 (L) 13.0 - 17.0 g/dL   HCT 31.6 (L) 39.0 - 52.0 %   MCV 89.8 80.0 - 100.0 fL   MCH 28.1 26.0 - 34.0 pg   MCHC 31.3 30.0 - 36.0 g/dL   RDW 14.1 11.5 - 15.5 %   Platelets PLATELET CLUMPS NOTED ON SMEAR, UNABLE TO ESTIMATE 150 - 400 K/uL    Comment: PLATELET CLUMPS NOTED ON SMEAR, UNABLE TO ESTIMATE   nRBC 0.0 0.0 - 0.2 %    Comment: Performed at [facility], [place] [address], [place], [place] [address]  SARS Coronavirus 2 by RT PCR (hospital order, performed in [facility] hospital lab) *cepheid single result test* Anterior Nasal Swab     Status: None   Collection Time: 12/01/21  5:00 AM   Specimen: Anterior Nasal Swab   Result Value Ref Range   SARS Coronavirus 2 by RT PCR NEGATIVE NEGATIVE    Comment: (NOTE) SARS-CoV-2 target nucleic acids are NOT DETECTED.  The SARS-CoV-2 RNA is generally detectable in upper and lower respiratory specimens during the acute phase of infection. The lowest concentration of SARS-CoV-2 viral copies this assay can detect is 250 copies / mL. A negative result does not preclude SARS-CoV-2 infection and should not be used as the sole basis for treatment or other patient management decisions.  A negative result may occur with improper specimen collection / handling, submission of specimen other than nasopharyngeal swab, presence of viral mutation(s) within the areas targeted by this assay, and inadequate number of viral copies (<250 copies / mL). A negative result must be combined with clinical observations, patient history, and epidemiological information.  Fact Sheet for Patients:   https://www.patel.info/  Fact Sheet for Healthcare Providers: https://hall.com/  This test is not yet approved or  cleared by the [place] FDA and has been  authorized for detection and/or diagnosis of SARS-CoV-2 by FDA under an Emergency Use Authorization (EUA).  This EUA will remain in effect (meaning this test can be used) for the duration of the COVID-19 declaration under Section 564(b)(1) of the Act, 21 U.S.C. section 360bbb-3(b)(1), unless the authorization is terminated or revoked sooner.  Performed at [facility], [place] [address], [place], [place] [address]   Vitamin B12     Status: None   Collection Time: 12/01/21  5:30 AM  Result Value Ref Range   Vitamin B-12 187 180 - 914 pg/mL    Comment: (NOTE) This assay is not validated for testing neonatal or myeloproliferative syndrome specimens for Vitamin B12 levels. Performed at [facility], [place] [address] [place], [place] [address]   Folate     Status: None    Collection Time: 12/01/21  5:30 AM  Result Value Ref Range   Folate 11.8 >5.9 ng/mL    Comment: Performed at [facility] [address], [place], [place] [address]  Iron and TIBC     Status: Abnormal   Collection Time: 12/01/21  5:30 AM  Result Value Ref Range   Iron 13 (L) 45 - 182 ug/dL   TIBC 174 (L) 250 - 450 ug/dL   Saturation Ratios 8 (L) 17.9 - 39.5 %   UIBC 161 ug/dL    Comment: Performed at [facility], [place] [address], [place], [place] [address]  Ferritin     Status: Abnormal   Collection Time: 12/01/21  5:30 AM  Result Value Ref Range   Ferritin 839 (H) 24 - 336 ng/mL    Comment: Performed at [facility], [place] [address] [place], [place] [address]  Uric acid     Status: None   Collection Time: 12/01/21  5:30 AM  Result Value Ref Range   Uric Acid, Serum 6.4 3.7 - 8.6 mg/dL    Comment: Performed at [facility] [address], [place], [place] [address]  Reticulocytes     Status: Abnormal   Collection Time: 12/01/21  5:32 AM  Result Value Ref Range   Retic Ct Pct 0.9 0.4 - 3.1 %   RBC. 3.66 (L) 4.22 - 5.81 MIL/uL   Retic Count, Absolute 32.2 19.0 - 186.0 K/uL   Immature Retic Fract 16.6 (H) 2.3 - 15.9 %    Comment: Performed at [facility] [address], [place],  [address]  CBG monitoring, ED     Status: Abnormal   Collection Time: 12/01/21  8:17 AM  Result Value Ref Range   Glucose-Capillary 139 (H) 70 - 99 mg/dL    Comment: Glucose reference range applies only to samples taken after fasting for at least 8 hours.  CBG monitoring, ED     Status: Abnormal   Collection Time: 12/01/21  1:01 PM  Result Value Ref Range   Glucose-Capillary 116 (H) 70 - 99 mg/dL    Comment: Glucose reference range applies only to samples taken after fasting for at least 8 hours.   CT ABDOMEN PELVIS WO CONTRAST  Result Date: 11/30/2021 CLINICAL DATA:  Abdominal pain, acute, nonlocalized EXAM: CT ABDOMEN AND PELVIS WITHOUT CONTRAST TECHNIQUE:  Multidetector CT imaging of the abdomen and pelvis was performed following the standard protocol without IV contrast. RADIATION DOSE REDUCTION: This exam was performed according to the departmental dose-optimization program which includes automated exposure control, adjustment of the mA and/or kV according to patient size  and/or use of iterative reconstruction technique. COMPARISON:  07/25/2021 FINDINGS: Lower chest: Centrilobular ground-glass pulmonary infiltrate is seen within the visualized right lung base, possibly infectious or inflammatory in the acute setting. No pleural effusion. Moderate right coronary artery calcification. Hepatobiliary: Stable mild hepatomegaly. No intrahepatic mass identified on this noncontrast examination. No intra or extrahepatic biliary ductal dilation. Gallbladder unremarkable. Pancreas: Unremarkable Spleen: Unremarkable Adrenals/Urinary Tract: The adrenal glands are unremarkable. The kidneys are normal in size and position. Mild left hydronephrosis and perinephric inflammatory stranding has developed. No intrarenal or ureteral calculi. No hydronephrosis on the right. There is circumferential bladder wall thickening and perivesicular inflammatory stranding as well as extensive gas within the bladder lumen. 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 The bladder is not distended. Stomach/Bowel: Severe descending and sigmoid colonic diverticulosis again noted. Extensive pericolonic inflammatory changes are again seen involving the sigmoid colon, slightly improved since prior examination. There has developed, however, punctate foci of extraluminal gas both between the dome of the bladder and the affected region of sigmoid colon, described above, as well as within the a pericolonic inflammatory collection best seen on axial image # 61/3 and  coronal image # 53/6 this phlegmonous collection appears relatively poorly defined no discrete drainable fluid component is identified. The stomach, small bowel, and large bowel are otherwise unremarkable. No evidence of obstruction. No free intraperitoneal gas or fluid. Appendix normal. Vascular/Lymphatic: Aortic atherosclerosis. No enlarged abdominal or pelvic lymph nodes. Reproductive: Prostate is unremarkable. Other: Moderate right and small left inguinal hernias are present with the right inguinal hernia sac containing a loop of unremarkable distal small bowel and the left hernia sac containing fat as well as perivesicular inflammatory fluid. Musculoskeletal: No acute bone abnormality. Degenerative changes are seen within the lumbar spine. No lytic or blastic bone lesion. IMPRESSION: 1. Extensive inflammatory changes involving the sigmoid colon in a background of severe diverticulosis, slightly improved since prior examination and in keeping with subacute perforated diverticulitis. Evolving pericolonic phlegmon without discrete drainable fluid collection identified. 2. Interval development of a colovesicular fistula with perivesicular inflammatory changes. Additionally, mild left hydronephrosis and left perinephric inflammatory changes have developed suggesting ascending left-sided infection. 3. Centrilobular ground-glass pulmonary infiltrate within the visualized right lung base, possibly infectious or inflammatory in the acute setting. 4. Stable mild hepatomegaly. 5. Moderate right coronary artery calcification. 6. Moderate right bowel containing and small left inguinal hernias. 7. Aortic atherosclerosis. Aortic Atherosclerosis (ICD10-I70.0). Electronically Signed   By: Fidela Salisbury M.D.   On: 11/30/2021 22:13   DG Chest 2 View  Result Date: 11/30/2021 CLINICAL DATA:  Dizziness, weakness EXAM: CHEST - 2 VIEW COMPARISON:  07/25/2021 FINDINGS: Cardiac size is within normal limits. Increase in AP diameter  of chest suggests COPD. There are no signs of pulmonary edema or focal pulmonary consolidation. There is interval decrease in pulmonary vascular congestion. There is no pleural effusion or pneumothorax. IMPRESSION: COPD. There are no signs of pulmonary edema or focal pulmonary consolidation. Electronically Signed   By: Elmer Picker M.D.   On: 11/30/2021 19:53    Pending Labs Unresulted Labs (From admission, onward)     Start     Ordered   12/02/21 0500  CBC  Tomorrow morning,   R        12/01/21 0916   12/02/21 4401  Basic metabolic panel  Tomorrow morning,   R  12/01/21 0916   12/02/21 0500  Magnesium  Tomorrow morning,   R        12/01/21 1023            Vitals/Pain Today's Vitals   12/01/21 1008 12/01/21 1008 12/01/21 1110 12/01/21 1304  BP:   115/75 (!) 110/58  Pulse:   98 100  Resp:   19 18  Temp: 98.7 F (37.1 C) 98.7 F (37.1 C)  98.8 F (37.1 C)  TempSrc: Oral Oral  Oral  SpO2:   96% 97%  Weight:      Height:      PainSc:    0-No pain    Isolation Precautions Airborne and Contact precautions  Medications Medications  insulin aspart (novoLOG) injection 0-6 Units ( Subcutaneous Not Given 12/01/21 1303)  fentaNYL (SUBLIMAZE) injection 25 mcg (25 mcg Intravenous Given 12/01/21 0344)  thiamine (VITAMIN B1) injection 100 mg (100 mg Intravenous Given 12/01/21 1108)  hydrALAZINE (APRESOLINE) injection 10 mg (has no administration in time range)  piperacillin-tazobactam (ZOSYN) IVPB 3.375 g (0 g Intravenous Stopped 12/01/21 0949)  0.9 %  sodium chloride infusion ( Intravenous New Bag/Given 12/01/21 0723)  HYDROmorphone (DILAUDID) injection 0.5 mg (has no administration in time range)  nicotine (NICODERM CQ - dosed in mg/24 hours) patch 21 mg (has no administration in time range)  piperacillin-tazobactam (ZOSYN) IVPB 3.375 g (0 g Intravenous Stopped 12/01/21 0000)  sodium chloride 0.9 % bolus 1,000 mL (0 mLs Intravenous Stopped 12/01/21 0331)  sodium chloride  0.9 % bolus 1,000 mL (0 mLs Intravenous Stopped 12/01/21 0648)  HYDROmorphone (DILAUDID) injection 0.5 mg (0.5 mg Intravenous Given 12/01/21 0723)    Mobility walks Low fall risk   Focused Assessments     R Recommendations: See Admitting Provider Note  Report given to:   Additional Notes:

## 2021-12-01 NOTE — H&P (Signed)
History and Physical    GEOVANI TOOTLE CHE:[id number] DOB: 1960-10-20 DOA: 11/30/2021  Patient coming from: Home.  Chief Complaint: Abdominal pain.  HPI: Donald Bender is a 61 y.o. male with history of diabetes mellitus type 2 last hemoglobin A1c was around 8, hypertension, gout admitted in June of this year for perforated diverticulitis managed conservatively and subsequent to which in September of this year patient had colonoscopy which showed diverticulosis and also polyps and lipoma removed presents to the ER because of persistent abdominal pain across the lower quadrants for the last few months.  He denies any nausea vomiting or diarrhea.  Denies any blood in the stools.  ED Course: In the ER CT scan shows subacute perforated diverticulitis of the sigmoid colon with possible developing phlegmon and also concerning features for colovesical fistula with perinephric stranding concerning for ascending infection.  Patient's labs show leukocytosis and acute renal failure and anemia.  Started on empiric antibiotics after cultures.  General surgery was consulted.  Lung fields show inflammatory versus infectious changes.  COVID test is pending.  Review of Systems: As per HPI, rest all negative.   Past Medical History:  Diagnosis Date   Diabetes mellitus without complication ([place])    Diverticulitis    Hypertension     Past Surgical History:  Procedure Laterality Date   NO PAST SURGERIES       reports that he has been smoking cigarettes. He has been smoking an average of 1 pack per day. He has never used smokeless tobacco. He reports that he does not currently use alcohol after a past usage of about 6.0 standard drinks of alcohol per week. He reports that he does not use drugs.  No Known Allergies  Family History  Problem Relation Age of Onset   Leukemia Mother 43   Diverticulitis Brother    Colon cancer Neg Hx    Stomach cancer Neg Hx    Esophageal cancer Neg Hx    Colon polyps Neg  Hx     Prior to Admission medications   Medication Sig Start Date End Date Taking? Authorizing Provider  amLODipine (NORVASC) 10 MG tablet Take 1 tablet (10 mg total) by mouth daily. Patient not taking: Reported on 07/25/2021 03/30/20   Charlott Rakes, MD  aspirin EC 81 MG tablet Take 1 tablet (81 mg total) by mouth daily. 01/24/17   Argentina Donovan, PA-C  atorvastatin (LIPITOR) 40 MG tablet Take 1 tablet (40 mg total) by mouth daily. For cholesterol Patient not taking: Reported on 07/25/2021 03/30/20   Charlott Rakes, MD  colchicine 0.6 MG tablet TAKE 2 TABLETS BY MOUTH AT THE ONSET OF A GOUT ATTACK, MAY REPEAT 1 TABLET IN 2 HOURS IF SYMPTOMS PERSIST. 09/15/21   Charlott Rakes, MD  empagliflozin (JARDIANCE) 25 MG TABS tablet Take 1 tablet (25 mg total) by mouth daily before breakfast. 08/14/21   Kerin Perna, NP  hydrALAZINE (APRESOLINE) 25 MG tablet Take 1 tablet (25 mg total) by mouth every 8 (eight) hours. 09/14/21 10/19/21  Charlott Rakes, MD  hydroxypropyl methylcellulose / hypromellose (ISOPTO TEARS / GONIOVISC) 2.5 % ophthalmic solution Place 1 drop into both eyes 2 (two) times daily as needed for dry eyes. Patient not taking: Reported on 09/06/2021    [provider]  ibuprofen (ADVIL) 200 MG tablet Take 400 mg by mouth every 6 (six) hours as needed for headache or moderate pain. Patient not taking: Reported on 09/06/2021    [provider]  losartan (COZAAR)  100 MG tablet Take 1 tablet (100 mg total) by mouth daily. 09/14/21 10/19/21  Charlott Rakes, MD  metFORMIN (GLUCOPHAGE) 1000 MG tablet Take 1 tablet (1,000 mg total) by mouth 2 (two) times daily with a meal. 08/14/21   Kerin Perna, NP  naproxen sodium (ALEVE) 220 MG tablet Take 440 mg by mouth 2 (two) times daily as needed (pain). Patient not taking: Reported on 09/06/2021    [provider]  nicotine (NICODERM CQ) 14 mg/24hr patch Place 1 patch (14 mg total) onto the skin daily. Patient not taking:  Reported on 09/06/2021 08/14/21   Kerin Perna, NP  nicotine (NICODERM CQ) 21 mg/24hr patch Place 1 patch (21 mg total) onto the skin daily. Patient not taking: Reported on 09/06/2021 08/14/21   Kerin Perna, NP  nicotine (NICODERM CQ) 7 mg/24hr patch Place 1 patch (7 mg total) onto the skin daily. Patient not taking: Reported on 09/06/2021 08/14/21   Kerin Perna, NP  spironolactone (ALDACTONE) 50 MG tablet Take 1 tablet (50 mg total) by mouth daily. Patient not taking: Reported on 07/25/2021 10/05/20   Charlott Rakes, MD    Physical Exam: Constitutional: Moderately built and nourished. Vitals:   12/01/21 0030 12/01/21 0120 12/01/21 0121 12/01/21 0200  BP: (!) 108/46 (!) 110/54  (!) 123/59  Pulse: (!) 110 (!) 107 (!) 112 (!) 106  Resp: 18 20 (!) 21 (!) 22  Temp:      TempSrc:      SpO2: 91% 96% 95% 93%  Weight:      Height:       Eyes: Anicteric no pallor. ENMT: No discharge from the ears eyes nose and mouth. Neck: No mass felt.  No neck rigidity. Respiratory: No rhonchi or crepitations. Cardiovascular: S1-S2 heard. Abdomen: Tenderness in the left lower quadrant.  No guarding or rigidity. Musculoskeletal: No edema. Skin: No rash. Neurologic: Alert awake oriented time place and person.  Moves all extremities. Psychiatric: Appears normal.  Normal affect.   Labs on Admission: I have personally reviewed following labs and imaging studies  CBC: Recent Labs  Lab 11/30/21 1858  WBC 24.4*  NEUTROABS 20.9*  HGB 11.0*  HCT 34.1*  MCV 89.7  PLT 037*   Basic Metabolic Panel: Recent Labs  Lab 11/30/21 1858  NA 134*  K 3.8  CL 97*  CO2 22  GLUCOSE 175*  BUN 33*  CREATININE 2.06*  CALCIUM 8.7*   GFR: Estimated Creatinine Clearance: 45 mL/min (A) (by C-G formula based on SCr of 2.06 mg/dL (H)). Liver Function Tests: Recent Labs  Lab 11/30/21 1858  AST 18  ALT 9  ALKPHOS 53  BILITOT 0.7  PROT 7.3  ALBUMIN 2.7*   No results for input(s): "LIPASE",  "AMYLASE" in the last 168 hours. No results for input(s): "AMMONIA" in the last 168 hours. Coagulation Profile: No results for input(s): "INR", "PROTIME" in the last 168 hours. Cardiac Enzymes: No results for input(s): "CKTOTAL", "CKMB", "CKMBINDEX", "TROPONINI" in the last 168 hours. BNP (last 3 results) No results for input(s): "PROBNP" in the last 8760 hours. HbA1C: No results for input(s): "HGBA1C" in the last 72 hours. CBG: No results for input(s): "GLUCAP" in the last 168 hours. Lipid Profile: No results for input(s): "CHOL", "HDL", "LDLCALC", "TRIG", "CHOLHDL", "LDLDIRECT" in the last 72 hours. Thyroid Function Tests: No results for input(s): "TSH", "T4TOTAL", "FREET4", "T3FREE", "THYROIDAB" in the last 72 hours. Anemia Panel: No results for input(s): "VITAMINB12", "FOLATE", "FERRITIN", "TIBC", "IRON", "RETICCTPCT" in the last 72  hours. Urine analysis:    Component Value Date/Time   COLORURINE YELLOW 11/30/2021 1821   APPEARANCEUR TURBID (A) 11/30/2021 1821   LABSPEC 1.015 11/30/2021 1821   PHURINE 5.0 11/30/2021 1821   GLUCOSEU NEGATIVE 11/30/2021 1821   HGBUR MODERATE (A) 11/30/2021 1821   BILIRUBINUR NEGATIVE 11/30/2021 1821   KETONESUR NEGATIVE 11/30/2021 1821   PROTEINUR 100 (A) 11/30/2021 1821   NITRITE NEGATIVE 11/30/2021 1821   LEUKOCYTESUR MODERATE (A) 11/30/2021 1821   Sepsis Labs: '@LABRCNTIP'$ (procalcitonin:4,lacticidven:4) )No results found for this or any previous visit (from the past 240 hour(s)).   Radiological Exams on Admission: CT ABDOMEN PELVIS WO CONTRAST  Result Date: 11/30/2021 CLINICAL DATA:  Abdominal pain, acute, nonlocalized EXAM: CT ABDOMEN AND PELVIS WITHOUT CONTRAST TECHNIQUE: Multidetector CT imaging of the abdomen and pelvis was performed following the standard protocol without IV contrast. RADIATION DOSE REDUCTION: This exam was performed according to the departmental dose-optimization program which includes automated exposure control,  adjustment of the mA and/or kV according to patient size and/or use of iterative reconstruction technique. COMPARISON:  07/25/2021 FINDINGS: Lower chest: Centrilobular ground-glass pulmonary infiltrate is seen within the visualized right lung base, possibly infectious or inflammatory in the acute setting. No pleural effusion. Moderate right coronary artery calcification. Hepatobiliary: Stable mild hepatomegaly. No intrahepatic mass identified on this noncontrast examination. No intra or extrahepatic biliary ductal dilation. Gallbladder unremarkable. Pancreas: Unremarkable Spleen: Unremarkable Adrenals/Urinary Tract: The adrenal glands are unremarkable. The kidneys are normal in size and position. Mild left hydronephrosis and perinephric inflammatory stranding has developed. No intrarenal or ureteral calculi. No hydronephrosis on the right. There is circumferential bladder wall thickening and perivesicular inflammatory stranding as well as extensive gas within the bladder lumen. 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 The bladder is not distended. Stomach/Bowel: Severe descending and sigmoid colonic diverticulosis again noted. Extensive pericolonic inflammatory changes are again seen involving the sigmoid colon, slightly improved since prior examination. There has developed, however, punctate foci of extraluminal gas both between the dome of the bladder and the affected region of sigmoid colon, described above, as well as within the a pericolonic inflammatory collection best seen on axial image # 61/3 and coronal image # 53/6 this phlegmonous collection appears relatively poorly defined no discrete drainable fluid component is identified. The stomach, small bowel, and large bowel are otherwise unremarkable. No evidence of obstruction. No free intraperitoneal gas or  fluid. Appendix normal. Vascular/Lymphatic: Aortic atherosclerosis. No enlarged abdominal or pelvic lymph nodes. Reproductive: Prostate is unremarkable. Other: Moderate right and small left inguinal hernias are present with the right inguinal hernia sac containing a loop of unremarkable distal small bowel and the left hernia sac containing fat as well as perivesicular inflammatory fluid. Musculoskeletal: No acute bone abnormality. Degenerative changes are seen within the lumbar spine. No lytic or blastic bone lesion. IMPRESSION: 1. Extensive inflammatory changes involving the sigmoid colon in a background of severe diverticulosis, slightly improved since prior examination and in keeping with subacute perforated diverticulitis. Evolving pericolonic phlegmon without discrete drainable fluid collection identified. 2. Interval development of a colovesicular fistula with perivesicular inflammatory changes. Additionally, mild left hydronephrosis and left perinephric inflammatory changes have developed suggesting ascending left-sided infection. 3. Centrilobular ground-glass pulmonary infiltrate within the visualized right lung base, possibly infectious or inflammatory in the acute setting. 4. Stable mild hepatomegaly. 5. Moderate right coronary artery calcification. 6. Moderate right bowel containing and  small left inguinal hernias. 7. Aortic atherosclerosis. Aortic Atherosclerosis (ICD10-I70.0). Electronically Signed   By: Fidela Salisbury M.D.   On: 11/30/2021 22:13   DG Chest 2 View  Result Date: 11/30/2021 CLINICAL DATA:  Dizziness, weakness EXAM: CHEST - 2 VIEW COMPARISON:  07/25/2021 FINDINGS: Cardiac size is within normal limits. Increase in AP diameter of chest suggests COPD. There are no signs of pulmonary edema or focal pulmonary consolidation. There is interval decrease in pulmonary vascular congestion. There is no pleural effusion or pneumothorax. IMPRESSION: COPD. There are no signs of pulmonary edema or  focal pulmonary consolidation. Electronically Signed   By: Elmer Picker M.D.   On: 11/30/2021 19:53    EKG: Independently reviewed.  Sinus tachycardia.  Assessment/Plan Principal Problem:   Diverticulitis of colon with perforation Active Problems:   Polycythemia   Gout   Type 2 diabetes mellitus with hyperglycemia ([place])   Hypertension    Perforated sigmoid diverticulitis with possible developing phlegmon -General surgery has been notified.  We will keep patient n.p.o. IV fluids antibiotics.  Further recommendations per general surgery. Possible colovesical fistula. Possible ascending infection involving the urinary tract with perinephric stranding on empiric antibiotics follow cultures. Abnormality seen in the chest on the CT scan concerning for inflammation versus infectious changes.  COVID test pending.  Presently on empiric antibiotics. Acute renal failure mild hyponatremia with creatinine increased from normal in June now.  It is around 2.6.  Continue hydration follow metabolic panel. Anemia appears to be new.  Follow CBC.  Has had a colonoscopy in September 2023 which showed diverticulosis and polyps were removed.  Check anemia panel. Thrombocytopenia could be from prior history of alcoholism.  Follow CBC.  Appears to be chronic. Prior history of alcoholism patient states he has not had any alcohol since his discharge in June 2020. Diabetes mellitus type 2 last hemoglobin A1c was 8.  We will keep patient on sliding scale coverage for now and follow CBGs closely. History of hypertension we will keep patient n.p.o. and IV hydralazine follow blood pressure trends. History of gout with patient complaining of increasing pain around the upper and lower extremity joints.  Will check uric acid levels.  Since patient has perforated sigmoid diverticulitis with acute renal failure will need close monitoring and further work-up and more than 2 midnight stay inpatient status.   DVT  prophylaxis: SCDs.  Avoiding anticoagulation in anticipation of procedure. Code Status: Full code. Family Communication: Discussed with patient. Disposition Plan: Home when stable. Consults called: General surgery was notified by the ER physician. Admission status: Inpatient.

## 2021-12-01 NOTE — Progress Notes (Signed)
Same day note   Donald Bender is a 61 y.o. male with past medical history of type 2 diabetes, hypertension, gout and history of perforated diverticulitis which was managed conservatively in June patient hospital this time with abdominal pain several months which was persistent.  Of note patient had a colonoscopy in September which showed diverticulosis and polyps and lipoma which was removed.  In the ED CT scan done this time showed a subacute perforated diverticulitis of the sigmoid colon with possible developing phlegmon and features concerning for colovesical fistula with perinephric stranding.  Patient also had leukocytosis and AKI with anemia.  Was started on IV empiric antibiotic after obtaining cultures.  Chest x-ray showed COPD.  General surgery was consulted and patient was admitted hospital for further evaluation and treatment.    Patient seen and examined at bedside.  Patient was admitted to the hospital for abdominal pain.  At the time of my evaluation, patient complains of pain.  Had chills yesterday.  No nausea or vomiting.  Has not had a bowel movement in a week or so.  Passing flatus.  Physical examination reveals average built male, alert awake oriented, nasal area with fungating lesion.  Abdomen  tenderness over the lower abdomen.  Laboratory data and imaging was reviewed  Assessment and plan  Perforated sigmoid diverticulitis with possible developing phlegmon possible colovesical fistula. General surgery has been consulted.  Continue n.p.o. IV fluids.  Follow-up plan as per-General surgery.  Blood cultures negative in less than 12 hours.   Patient did have significant leukocytosis.  Follow general surgery recommendations .  On IV Zosyn.  COVID-negative UA with turbid appearance and protein.  Moderate leukocytes.  Initial WBC at 24.4.  Possible ascending infection involving the urinary tract with perinephric stranding on scan.  Continue empiric antibiotic.  Blood cultures  negative less than 12 hours.  Pulmonary centrilobular groundglass infiltrate in the right lung base.  Seen on CT scan of the abdomen. On empiric antibiotic.  COVID 19 was negative.  Acute renal failure mild hyponatremia  Creatinine on presentation at 2.6.  Has trended down to 2.1 this morning.  Continue to follow BMP.  Creatinine on 07/28/2021 at 0.6.  Strict intake and output charting, CT abdomen mild left hydronephrosis and perinephric inflammatory changes.  Anemia  Appears to be new.  Had a colonoscopy in September with Diverticulosis and polyps were removed.  Vitamin B12 at 187 and borderline low.  Folic acid 06.3 within normal range.  Iron panel shows serum iron low at 13 with low TIBC.  Ferritin elevated at 879 but patient has acute infection.  Thrombocytopenia  Patient admitted for sepsis and previous history of alcoholism.  Continue to monitor.  Chronic.  Prior history of alcoholism  Has not drank since June 2020   Diabetes mellitus type 2  Latest hemoglobin A1c of 8.0.  Sliding scale insulin for now.  History of hypertension  Currently NPO.  IV hydralazine.    History of gout Complains of pain over the joints.  Uric acid at 6.4.   Spoke with the patient's brother at bedside.  Signed,  Delila Pereyra, MD [facility]

## 2021-12-02 DIAGNOSIS — E1165 Type 2 diabetes mellitus with hyperglycemia: Secondary | ICD-10-CM

## 2021-12-02 DIAGNOSIS — I1A0 Resistant hypertension: Secondary | ICD-10-CM

## 2021-12-02 DIAGNOSIS — Z794 Long term (current) use of insulin: Secondary | ICD-10-CM

## 2021-12-02 DIAGNOSIS — M1A0710 Idiopathic chronic gout, right ankle and foot, without tophus (tophi): Secondary | ICD-10-CM

## 2021-12-02 LAB — BASIC METABOLIC PANEL
Anion gap: 8 (ref 5–15)
BUN: 31 mg/dL — ABNORMAL HIGH (ref 8–23)
CO2: 24 mmol/L (ref 22–32)
Calcium: 8.1 mg/dL — ABNORMAL LOW (ref 8.9–10.3)
Chloride: 102 mmol/L (ref 98–111)
Creatinine, Ser: 1.2 mg/dL (ref 0.61–1.24)
GFR, Estimated: >60 mL/min (ref >60)
Glucose, Bld: 102 mg/dL — ABNORMAL HIGH (ref 70–99)
Potassium: 3.3 mmol/L — ABNORMAL LOW (ref 3.5–5.1)
Sodium: 134 mmol/L — ABNORMAL LOW (ref 135–145)

## 2021-12-02 LAB — GLUCOSE, CAPILLARY
Glucose-Capillary: 102 mg/dL — ABNORMAL HIGH (ref 70–99)
Glucose-Capillary: 115 mg/dL — ABNORMAL HIGH (ref 70–99)
Glucose-Capillary: 116 mg/dL — ABNORMAL HIGH (ref 70–99)
Glucose-Capillary: 132 mg/dL — ABNORMAL HIGH (ref 70–99)
Glucose-Capillary: 135 mg/dL — ABNORMAL HIGH (ref 70–99)
Glucose-Capillary: 89 mg/dL (ref 70–99)

## 2021-12-02 LAB — CBC
HCT: 28.5 % — ABNORMAL LOW (ref 39.0–52.0)
Hemoglobin: 9.2 g/dL — ABNORMAL LOW (ref 13.0–17.0)
MCH: 28.4 pg (ref 26.0–34.0)
MCHC: 32.3 g/dL (ref 30.0–36.0)
MCV: 88 fL (ref 80.0–100.0)
Platelets: UNDETERMINED 10*3/uL (ref 150–400)
RBC: 3.24 MIL/uL — ABNORMAL LOW (ref 4.22–5.81)
RDW: 14 % (ref 11.5–15.5)
WBC: 13.5 10*3/uL — ABNORMAL HIGH (ref 4.0–10.5)
nRBC: 0 % (ref 0.0–0.2)

## 2021-12-02 LAB — MAGNESIUM: Magnesium: 2 mg/dL (ref 1.7–2.4)

## 2021-12-02 MED ORDER — POTASSIUM CHLORIDE IN NACL 20-0.45 MEQ/L-% IV SOLN
INTRAVENOUS | Status: DC
  Filled 2021-12-02 (×2): qty 1000

## 2021-12-02 NOTE — Progress Notes (Signed)
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 - 2nd bout, patient was admitted in June 2023 and managed conservatively - outpatient colonoscopy 10/17/21 which revealed multiple polyps that were tubular adenomas, a lipoma in the transverse colon, diverticulosis in the sigmoid colon, and congested mucosa in the sigmoid colon; he was advised to follow up in 3 years for repeat colonoscopy - returns 11/2, CT with extensive inflammatory changes involving the sigmoid colon in a background of severe diverticulosis; evolving pericolonic phlegmon without discrete drainable fluid collection; interval development of a colovesicular fistula with perivesicular inflammatory changes; mild left hydronephrosis and left perinephric inflammatory changes have developed suggesting ascending left-sided infection  Clear liquid diet today Ambulate in halls   ID - zosyn VTE - SCDs, ok for chemical dvt ppx from surgical standpoint FEN - IVF, sips Foley - none   AKI Left hydronephrosis Pulmonary centrilobular groundglass infiltrate in the right lung base  Anemia Thrombocytopenia DM HTN Hx gout Tobacco abuse  Prior alcohol abuse  Bilateral inguinal hernias        Donald Gemma, MD [facility] Office: [phone]        Chief Complaint: Complicated diverticulitis with fistula  Subjective: Patient in bed, comfortable.  Would like to eat.  Objective: Vital signs in last 24 hours: Temp:  [97.8 F (36.6 C)-98.8 F (37.1 C)] 97.8 F (36.6 C) (11/04 0743) Pulse Rate:  [88-108] 94 (11/04 0743) Resp:  [12-20] 17 (11/04 0743) BP: (96-130)/(56-75) 111/59 (11/04 0743) SpO2:  [91 %-97 %] 94 % (11/04 0743) Weight:  [97.5 kg] 97.5 kg (11/04 0459) Last BM Date : 12/01/21  Intake/Output from previous day: No intake/output data recorded. Intake/Output this shift: No intake/output data recorded.  Physical Exam: HEENT -  sclerae clear, mucous membranes moist Neck - soft Abdomen - soft without distension; mild LLQ tenderness, no mass, no guarding Ext - no edema, non-tender Neuro - alert & oriented, no focal deficits  Lab Results:  Recent Labs    12/01/21 0344 12/02/21 0220  WBC 18.8* 13.5*  HGB 9.9* 9.2*  HCT 31.6* 28.5*  PLT PLATELET CLUMPS NOTED ON SMEAR, UNABLE TO ESTIMATE PLATELET CLUMPS NOTED ON SMEAR, UNABLE TO ESTIMATE   BMET Recent Labs    12/01/21 0344 12/02/21 0220  NA 134* 134*  K 3.8 3.3*  CL 99 102  CO2 24 24  GLUCOSE 127* 102*  BUN 37* 31*  CREATININE 2.10* 1.20  CALCIUM 8.1* 8.1*   PT/INR No results for input(s): "LABPROT", "INR" in the last 72 hours. Comprehensive Metabolic Panel:    Component Value Date/Time   NA 134 (L) 12/02/2021 0220   NA 134 (L) 12/01/2021 0344   NA 137 06/02/2020 1450   NA 139 05/12/2020 1055   K 3.3 (L) 12/02/2021 0220   K 3.8 12/01/2021 0344   CL 102 12/02/2021 0220   CL 99 12/01/2021 0344   CO2 24 12/02/2021 0220   CO2 24 12/01/2021 0344   BUN 31 (H) 12/02/2021 0220   BUN 37 (H) 12/01/2021 0344   BUN 48 (H) 06/02/2020 1450   BUN 31 (H) 05/12/2020 1055   CREATININE 1.20 12/02/2021 0220   CREATININE 2.10 (H) 12/01/2021 0344   CREATININE 0.64 (L) 11/21/2015 0915   CREATININE 0.66 (L) 04/11/2015 1002   GLUCOSE 102 (H) 12/02/2021 0220   GLUCOSE 127 (H) 12/01/2021 0344   CALCIUM 8.1 (L) 12/02/2021 0220   CALCIUM 8.1 (L) 12/01/2021 0344   AST 18 12/01/2021  0344   AST 18 11/30/2021 1858   ALT 10 12/01/2021 0344   ALT 9 11/30/2021 1858   ALKPHOS 49 12/01/2021 0344   ALKPHOS 53 11/30/2021 1858   BILITOT 0.8 12/01/2021 0344   BILITOT 0.7 11/30/2021 1858   BILITOT 0.9 06/02/2020 1450   BILITOT 0.3 01/24/2017 1512   PROT 6.9 12/01/2021 0344   PROT 7.3 11/30/2021 1858   PROT 7.3 06/02/2020 1450   PROT 6.6 01/24/2017 1512   ALBUMIN 2.3 (L) 12/01/2021 0344   ALBUMIN 2.7 (L) 11/30/2021 1858   ALBUMIN 4.3 06/02/2020 1450   ALBUMIN 4.2  01/24/2017 1512    Studies/Results: CT ABDOMEN PELVIS WO CONTRAST  Result Date: 11/30/2021 CLINICAL DATA:  Abdominal pain, acute, nonlocalized EXAM: CT ABDOMEN AND PELVIS WITHOUT CONTRAST TECHNIQUE: Multidetector CT imaging of the abdomen and pelvis was performed following the standard protocol without IV contrast. RADIATION DOSE REDUCTION: This exam was performed according to the departmental dose-optimization program which includes automated exposure control, adjustment of the mA and/or kV according to patient size and/or use of iterative reconstruction technique. COMPARISON:  07/25/2021 FINDINGS: Lower chest: Centrilobular ground-glass pulmonary infiltrate is seen within the visualized right lung base, possibly infectious or inflammatory in the acute setting. No pleural effusion. Moderate right coronary artery calcification. Hepatobiliary: Stable mild hepatomegaly. No intrahepatic mass identified on this noncontrast examination. No intra or extrahepatic biliary ductal dilation. Gallbladder unremarkable. Pancreas: Unremarkable Spleen: Unremarkable Adrenals/Urinary Tract: The adrenal glands are unremarkable. The kidneys are normal in size and position. Mild left hydronephrosis and perinephric inflammatory stranding has developed. No intrarenal or ureteral calculi. No hydronephrosis on the right. There is circumferential bladder wall thickening and perivesicular inflammatory stranding as well as extensive gas within the bladder lumen. 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 The bladder is not distended. Stomach/Bowel: Severe descending and sigmoid colonic diverticulosis again noted. Extensive pericolonic inflammatory changes are again seen involving the sigmoid colon, slightly improved since prior examination. There has developed, however, punctate foci of  extraluminal gas both between the dome of the bladder and the affected region of sigmoid colon, described above, as well as within the a pericolonic inflammatory collection best seen on axial image # 61/3 and coronal image # 53/6 this phlegmonous collection appears relatively poorly defined no discrete drainable fluid component is identified. The stomach, small bowel, and large bowel are otherwise unremarkable. No evidence of obstruction. No free intraperitoneal gas or fluid. Appendix normal. Vascular/Lymphatic: Aortic atherosclerosis. No enlarged abdominal or pelvic lymph nodes. Reproductive: Prostate is unremarkable. Other: Moderate right and small left inguinal hernias are present with the right inguinal hernia sac containing a loop of unremarkable distal small bowel and the left hernia sac containing fat as well as perivesicular inflammatory fluid. Musculoskeletal: No acute bone abnormality. Degenerative changes are seen within the lumbar spine. No lytic or blastic bone lesion. IMPRESSION: 1. Extensive inflammatory changes involving the sigmoid colon in a background of severe diverticulosis, slightly improved since prior examination and in keeping with subacute perforated diverticulitis. Evolving pericolonic phlegmon without discrete drainable fluid collection identified. 2. Interval development of a colovesicular fistula with perivesicular inflammatory changes. Additionally, mild left hydronephrosis and left perinephric inflammatory changes have developed suggesting ascending left-sided infection. 3. Centrilobular ground-glass pulmonary infiltrate within the visualized right lung base, possibly infectious or inflammatory in the acute setting. 4. Stable mild hepatomegaly. 5. Moderate right coronary artery calcification. 6.  Moderate right bowel containing and small left inguinal hernias. 7. Aortic atherosclerosis. Aortic Atherosclerosis (ICD10-I70.0). Electronically Signed   By: Fidela Salisbury M.D.   On:  11/30/2021 22:13   DG Chest 2 View  Result Date: 11/30/2021 CLINICAL DATA:  Dizziness, weakness EXAM: CHEST - 2 VIEW COMPARISON:  07/25/2021 FINDINGS: Cardiac size is within normal limits. Increase in AP diameter of chest suggests COPD. There are no signs of pulmonary edema or focal pulmonary consolidation. There is interval decrease in pulmonary vascular congestion. There is no pleural effusion or pneumothorax. IMPRESSION: COPD. There are no signs of pulmonary edema or focal pulmonary consolidation. Electronically Signed   By: Elmer Picker M.D.   On: 11/30/2021 19:53      Donald Bender 12/02/2021  Patient ID: Donald Bender, male   DOB: 14-Dec-1960, 61 y.o.   MRN: [id number]

## 2021-12-02 NOTE — Progress Notes (Addendum)
PROGRESS NOTE    Donald Bender  TDV:[id number] DOB: 1960/07/09 DOA: 11/30/2021 PCP: Charlott Rakes, MD    Brief Narrative:   Donald Bender is a 61 y.o. male with past medical history of type 2 diabetes, hypertension, gout and history of perforated diverticulitis which was managed conservatively in June, presented to the hospital with abdominal pain for several months which was persistent.  Of note,patient had a colonoscopy in September which showed diverticulosis and polyps and lipoma which was removed.  In the ED, CT scan abdomen was done which showed subacute perforated diverticulitis of the sigmoid colon with possible developing phlegmon and features concerning for colovesical fistula with perinephric stranding.  Patient also had leukocytosis and AKI with anemia.  Patient was started on IV empiric antibiotic after obtaining cultures.  Chest x-ray showed COPD.  General surgery was consulted and patient was admitted hospital for further evaluation and treatment.    Assessment and plan  Principal Problem:   Diverticulitis of colon with perforation Active Problems:   Polycythemia   Gout   Type 2 diabetes mellitus with hyperglycemia ([place])   Hypertension   Perforated sigmoid diverticulitis with possible developing phlegmon possible colovesical fistula. General surgery on board.  Continue IV fluids.  Blood cultures negative in 2 days.  Leukocytosis trending down from 24.4 to 13.5 today.  Continue IV Zosyn.  COVID-negative, UA with turbid appearance and protein.  Surgery has started the patient on clears today.  Ambulate in the hall.   Possible ascending infection involving the urinary tract with perinephric stranding on scan.  Continue empiric IV antibiotic.  Blood cultures negative in 2 days.  Mild hypokalemia.  We will replenish with IV fluids.  Check levels in AM.  Magnesium of 2.0.  Continue half-normal saline with KCl at 100 mill per hour.   Pulmonary centrilobular groundglass  infiltrate in the right lung base.   Seen on CT scan of the abdomen. On empiric antibiotics.  COVID 19 was negative.   Acute renal failure mild hyponatremia  Improved.  Creatinine on presentation at 2.6.  Has normalized at this time to 1.2.  Strict intake and output charting, CT abdomen mild left hydronephrosis and perinephric inflammatory changes.  Continue to monitor renal function.   Anemia  Appears to be new.  Had a colonoscopy in September with Diverticulosis and polyps were removed.  Vitamin B12 at 187 and borderline low.  Folic acid 06.2 within normal range.  Iron panel shows serum iron low at 13 with low TIBC.  Ferritin elevated at 879 but patient has acute infection.   Thrombocytopenia  Patient admitted for sepsis and previous history of alcoholism.  Continue to monitor.  Chronic.   Prior history of alcoholism  Has not drank since June 2020    Diabetes mellitus type 2  Latest hemoglobin A1c of 8.0.  Continue sliding scale insulin for now.   History of hypertension  Started on clears.  Continue as needed hydralazine.   History of gout Complains of pain over the joints.  Uric acid at 6.4.      DVT prophylaxis: SCDs Start: 12/01/21 0211   Code Status:     Code Status: Full Code  Disposition: Home  Status is: Inpatient  Remains inpatient appropriate because: IV antibiotic, surgical follow-up   Family Communication:  Spoke with the patient's brother at bedside on 12/01/2021  Consultants:  General surgery  Procedures:  None   Antimicrobials:  Zosyn 11/30/2021>  Anti-infectives (From admission, onward)    Start  Dose/Rate Route Frequency Ordered Stop   12/01/21 0600  piperacillin-tazobactam (ZOSYN) IVPB 3.375 g        3.375 g 12.5 mL/hr over 240 Minutes Intravenous Every 8 hours 12/01/21 0216     11/30/21 2300  piperacillin-tazobactam (ZOSYN) IVPB 3.375 g        3.375 g 100 mL/hr over 30 Minutes Intravenous  Once 11/30/21 2258 12/01/21 0000        Subjective: Today, patient was seen and examined at bedside.  Patient stated that he did have some pain in the morning and he received some medication but currently feels better.  No nausea vomiting fever chills or rigor.  Objective: Vitals:   12/01/21 1944 12/02/21 0004 12/02/21 0425 12/02/21 0459  BP: 130/65 (!) 100/57 119/66   Pulse: (!) 108 99 99   Resp: '17 17 18   '$ Temp: 98.7 F (37.1 C) 97.8 F (36.6 C) 98.1 F (36.7 C)   TempSrc: Oral Oral Oral   SpO2: 91% 93% 94%   Weight:    97.5 kg  Height:        Intake/Output Summary (Last 24 hours) at 12/02/2021 0742 Last data filed at 12/01/2021 1729 Gross per 24 hour  Intake 0 ml  Output --  Net 0 ml   Filed Weights   11/30/21 2255 12/02/21 0459  Weight: 97.5 kg 97.5 kg    Physical Examination: Body mass index is 26.87 kg/m.   General:  Average built, not in obvious distress HENT:   No scleral pallor or icterus noted. Oral mucosa is moist.  Nasal area with fungating lesion. Chest:  Clear breath sounds.  Diminished breath sounds bilaterally. No crackles or wheezes.  CVS: S1 &S2 heard. No murmur.  Regular rate and rhythm. Abdomen: Soft, mild tenderness over the left lower quadrant,  Extremities: No cyanosis, clubbing or edema.  Peripheral pulses are palpable. Psych: Alert, awake and oriented, normal mood CNS:  No cranial nerve deficits.  Power equal in all extremities.   Skin: Warm and dry.  No rashes noted.  Data Reviewed:   CBC: Recent Labs  Lab 11/30/21 1858 12/01/21 0344 12/02/21 0220  WBC 24.4* 18.8* 13.5*  NEUTROABS 20.9*  --   --   HGB 11.0* 9.9* 9.2*  HCT 34.1* 31.6* 28.5*  MCV 89.7 89.8 88.0  PLT 147* PLATELET CLUMPS NOTED ON SMEAR, UNABLE TO ESTIMATE PLATELET CLUMPS NOTED ON SMEAR, UNABLE TO ESTIMATE    Basic Metabolic Panel: Recent Labs  Lab 11/30/21 1858 12/01/21 0344 12/02/21 0220  NA 134* 134* 134*  K 3.8 3.8 3.3*  CL 97* 99 102  CO2 '22 24 24  '$ GLUCOSE 175* 127* 102*  BUN 33* 37* 31*   CREATININE 2.06* 2.10* 1.20  CALCIUM 8.7* 8.1* 8.1*  MG  --   --  2.0    Liver Function Tests: Recent Labs  Lab 11/30/21 1858 12/01/21 0344  AST 18 18  ALT 9 10  ALKPHOS 53 49  BILITOT 0.7 0.8  PROT 7.3 6.9  ALBUMIN 2.7* 2.3*     Radiology Studies: CT ABDOMEN PELVIS WO CONTRAST  Result Date: 11/30/2021 CLINICAL DATA:  Abdominal pain, acute, nonlocalized EXAM: CT ABDOMEN AND PELVIS WITHOUT CONTRAST TECHNIQUE: Multidetector CT imaging of the abdomen and pelvis was performed following the standard protocol without IV contrast. RADIATION DOSE REDUCTION: This exam was performed according to the departmental dose-optimization program which includes automated exposure control, adjustment of the mA and/or kV according to patient size and/or use of iterative reconstruction technique. COMPARISON:  07/25/2021 FINDINGS: Lower chest: Centrilobular ground-glass pulmonary infiltrate is seen within the visualized right lung base, possibly infectious or inflammatory in the acute setting. No pleural effusion. Moderate right coronary artery calcification. Hepatobiliary: Stable mild hepatomegaly. No intrahepatic mass identified on this noncontrast examination. No intra or extrahepatic biliary ductal dilation. Gallbladder unremarkable. Pancreas: Unremarkable Spleen: Unremarkable Adrenals/Urinary Tract: The adrenal glands are unremarkable. The kidneys are normal in size and position. Mild left hydronephrosis and perinephric inflammatory stranding has developed. No intrarenal or ureteral calculi. No hydronephrosis on the right. There is circumferential bladder wall thickening and perivesicular inflammatory stranding as well as extensive gas within the bladder lumen. 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 The bladder is not distended. Stomach/Bowel: Severe  descending and sigmoid colonic diverticulosis again noted. Extensive pericolonic inflammatory changes are again seen involving the sigmoid colon, slightly improved since prior examination. There has developed, however, punctate foci of extraluminal gas both between the dome of the bladder and the affected region of sigmoid colon, described above, as well as within the a pericolonic inflammatory collection best seen on axial image # 61/3 and coronal image # 53/6 this phlegmonous collection appears relatively poorly defined no discrete drainable fluid component is identified. The stomach, small bowel, and large bowel are otherwise unremarkable. No evidence of obstruction. No free intraperitoneal gas or fluid. Appendix normal. Vascular/Lymphatic: Aortic atherosclerosis. No enlarged abdominal or pelvic lymph nodes. Reproductive: Prostate is unremarkable. Other: Moderate right and small left inguinal hernias are present with the right inguinal hernia sac containing a loop of unremarkable distal small bowel and the left hernia sac containing fat as well as perivesicular inflammatory fluid. Musculoskeletal: No acute bone abnormality. Degenerative changes are seen within the lumbar spine. No lytic or blastic bone lesion. IMPRESSION: 1. Extensive inflammatory changes involving the sigmoid colon in a background of severe diverticulosis, slightly improved since prior examination and in keeping with subacute perforated diverticulitis. Evolving pericolonic phlegmon without discrete drainable fluid collection identified. 2. Interval development of a colovesicular fistula with perivesicular inflammatory changes. Additionally, mild left hydronephrosis and left perinephric inflammatory changes have developed suggesting ascending left-sided infection. 3. Centrilobular ground-glass pulmonary infiltrate within the visualized right lung base, possibly infectious or inflammatory in the acute setting. 4. Stable mild hepatomegaly. 5.  Moderate right coronary artery calcification. 6. Moderate right bowel containing and small left inguinal hernias. 7. Aortic atherosclerosis. Aortic Atherosclerosis (ICD10-I70.0). Electronically Signed   By: Fidela Salisbury M.D.   On: 11/30/2021 22:13   DG Chest 2 View  Result Date: 11/30/2021 CLINICAL DATA:  Dizziness, weakness EXAM: CHEST - 2 VIEW COMPARISON:  07/25/2021 FINDINGS: Cardiac size is within normal limits. Increase in AP diameter of chest suggests COPD. There are no signs of pulmonary edema or focal pulmonary consolidation. There is interval decrease in pulmonary vascular congestion. There is no pleural effusion or pneumothorax. IMPRESSION: COPD. There are no signs of pulmonary edema or focal pulmonary consolidation. Electronically Signed   By: Elmer Picker M.D.   On: 11/30/2021 19:53      LOS: 2 days    Flora Lipps, MD [facility] Available via Epic secure chat 7am-7pm After these hours, please refer to coverage provider listed on [email or web address] 12/02/2021, 7:42 AM

## 2021-12-03 LAB — GLUCOSE, CAPILLARY
Glucose-Capillary: 119 mg/dL — ABNORMAL HIGH (ref 70–99)
Glucose-Capillary: 127 mg/dL — ABNORMAL HIGH (ref 70–99)
Glucose-Capillary: 107 mg/dL — ABNORMAL HIGH (ref 70–99)
Glucose-Capillary: 117 mg/dL — ABNORMAL HIGH (ref 70–99)
Glucose-Capillary: 102 mg/dL — ABNORMAL HIGH (ref 70–99)
Glucose-Capillary: 101 mg/dL — ABNORMAL HIGH (ref 70–99)

## 2021-12-03 LAB — CBC
HCT: 29.1 % — ABNORMAL LOW (ref 39.0–52.0)
Hemoglobin: 9.6 g/dL — ABNORMAL LOW (ref 13.0–17.0)
MCH: 28.4 pg (ref 26.0–34.0)
MCHC: 33 g/dL (ref 30.0–36.0)
MCV: 86.1 fL (ref 80.0–100.0)
Platelets: UNDETERMINED 10*3/uL (ref 150–400)
RBC: 3.38 MIL/uL — ABNORMAL LOW (ref 4.22–5.81)
RDW: 13.7 % (ref 11.5–15.5)
WBC: 7.3 10*3/uL (ref 4.0–10.5)
nRBC: 0 % (ref 0.0–0.2)

## 2021-12-03 LAB — BASIC METABOLIC PANEL
Anion gap: 10 (ref 5–15)
BUN: 11 mg/dL (ref 8–23)
CO2: 24 mmol/L (ref 22–32)
Calcium: 8 mg/dL — ABNORMAL LOW (ref 8.9–10.3)
Chloride: 96 mmol/L — ABNORMAL LOW (ref 98–111)
Creatinine, Ser: 0.78 mg/dL (ref 0.61–1.24)
GFR, Estimated: >60 mL/min (ref >60)
Glucose, Bld: 110 mg/dL — ABNORMAL HIGH (ref 70–99)
Potassium: 3.6 mmol/L (ref 3.5–5.1)
Sodium: 130 mmol/L — ABNORMAL LOW (ref 135–145)

## 2021-12-03 MED ORDER — SODIUM CHLORIDE 0.9 % IV SOLN: INTRAVENOUS | Status: DC

## 2021-12-03 MED ORDER — SODIUM CHLORIDE 0.9 % IV SOLN
INTRAVENOUS | Status: DC
  Filled 2021-12-03 (×3): qty 1000

## 2021-12-03 MED ORDER — HYDRALAZINE HCL 25 MG PO TABS
25.0000 mg | ORAL_TABLET | Freq: Three times a day (TID) | ORAL | Status: DC
  Administered 2021-12-03 – 2021-12-05 (×6): 25 mg via ORAL
  Filled 2021-12-03 (×6): qty 1

## 2021-12-03 NOTE — Progress Notes (Signed)
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 - 2nd bout, patient was admitted in June 2023 and managed conservatively - outpatient colonoscopy 10/17/21 which revealed multiple polyps that were tubular adenomas, a lipoma in the transverse colon, diverticulosis in the sigmoid colon, and congested mucosa in the sigmoid colon; he was advised to follow up in 3 years for repeat colonoscopy - returns 11/2, CT with extensive inflammatory changes involving the sigmoid colon in a background of severe diverticulosis; evolving pericolonic phlegmon without discrete drainable fluid collection; interval development of a colovesicular fistula with perivesicular inflammatory changes; mild left hydronephrosis and left perinephric inflammatory changes have developed suggesting ascending left-sided infection   Continue clear liquid diet today Check WBC today Ambulate in halls   ID - zosyn VTE - SCDs, ok for chemical dvt ppx from surgical standpoint FEN - IVF, clear liquid diet Foley - none   AKI Left hydronephrosis Pulmonary centrilobular groundglass infiltrate in the right lung base  Anemia Thrombocytopenia DM HTN Hx gout Tobacco abuse  Prior alcohol abuse  Bilateral inguinal hernias          [name], MD [facility] practice Office: [phone]        Chief Complaint: Diverticular disease with colovesical fistula formation  Subjective: Patient in bed, appears comfortable.  Tolerating clear liquid diet.  Pain improved.  Objective: Vital signs in last 24 hours: Temp:  [98 F (36.7 C)-99.9 F (37.7 C)] 99.8 F (37.7 C) (11/05 0729) Pulse Rate:  [93-100] 97 (11/05 0729) Resp:  [16-18] 17 (11/05 0729) BP: (123-143)/(67-78) 140/67 (11/05 0729) SpO2:  [92 %-98 %] 95 % (11/05 0729) Last BM Date : 12/01/21  Intake/Output from previous day: 11/04 0701 - 11/05 0700 In: 889.4 [P.O.:220; I.V.:462; IV  Piggyback:207.4] Out: 600 [Urine:600] Intake/Output this shift: Total I/O In: -  Out: 300 [Urine:300]  Physical Exam: HEENT - sclerae clear, mucous membranes moist Neck - soft Abdomen - soft without distension; non-tender; no mass Ext - no edema, non-tender Neuro - alert & oriented, no focal deficits  Lab Results:  Recent Labs    12/01/21 0344 12/02/21 0220  WBC 18.8* 13.5*  HGB 9.9* 9.2*  HCT 31.6* 28.5*  PLT PLATELET CLUMPS NOTED ON SMEAR, UNABLE TO ESTIMATE PLATELET CLUMPS NOTED ON SMEAR, UNABLE TO ESTIMATE   BMET Recent Labs    12/01/21 0344 12/02/21 0220  NA 134* 134*  K 3.8 3.3*  CL 99 102  CO2 24 24  GLUCOSE 127* 102*  BUN 37* 31*  CREATININE 2.10* 1.20  CALCIUM 8.1* 8.1*   PT/INR No results for input(s): "LABPROT", "INR" in the last 72 hours. Comprehensive Metabolic Panel:    Component Value Date/Time   NA 134 (L) 12/02/2021 0220   NA 134 (L) 12/01/2021 0344   NA 137 06/02/2020 1450   NA 139 05/12/2020 1055   K 3.3 (L) 12/02/2021 0220   K 3.8 12/01/2021 0344   CL 102 12/02/2021 0220   CL 99 12/01/2021 0344   CO2 24 12/02/2021 0220   CO2 24 12/01/2021 0344   BUN 31 (H) 12/02/2021 0220   BUN 37 (H) 12/01/2021 0344   BUN 48 (H) 06/02/2020 1450   BUN 31 (H) 05/12/2020 1055   CREATININE 1.20 12/02/2021 0220   CREATININE 2.10 (H) 12/01/2021 0344   CREATININE 0.64 (L) 11/21/2015 0915   CREATININE 0.66 (L) 04/11/2015 1002   GLUCOSE 102 (H) 12/02/2021 0220   GLUCOSE 127 (H) 12/01/2021 0344  CALCIUM 8.1 (L) 12/02/2021 0220   CALCIUM 8.1 (L) 12/01/2021 0344   AST 18 12/01/2021 0344   AST 18 11/30/2021 1858   ALT 10 12/01/2021 0344   ALT 9 11/30/2021 1858   ALKPHOS 49 12/01/2021 0344   ALKPHOS 53 11/30/2021 1858   BILITOT 0.8 12/01/2021 0344   BILITOT 0.7 11/30/2021 1858   BILITOT 0.9 06/02/2020 1450   BILITOT 0.3 01/24/2017 1512   PROT 6.9 12/01/2021 0344   PROT 7.3 11/30/2021 1858   PROT 7.3 06/02/2020 1450   PROT 6.6 01/24/2017 1512    ALBUMIN 2.3 (L) 12/01/2021 0344   ALBUMIN 2.7 (L) 11/30/2021 1858   ALBUMIN 4.3 06/02/2020 1450   ALBUMIN 4.2 01/24/2017 1512    Studies/Results: No results found.    [name] 12/03/2021  Patient ID: [name], male   DOB: 1960/11/19, 61 y.o.   MRN: [id number]

## 2021-12-03 NOTE — Progress Notes (Addendum)
PROGRESS NOTE    Donald Bender  QJJ:[id number] DOB: [birth year] DOA: 11/30/2021 PCP: Charlott Rakes, MD    Brief Narrative:   Donald Bender is a 61 y.o. male with past medical history of type 2 diabetes, hypertension, gout and history of perforated diverticulitis which was managed conservatively in June, presented to the hospital with abdominal pain for several months which was persistent.  Of note,patient had a colonoscopy in September which showed diverticulosis and polyps and lipoma which was removed.  In the ED, CT scan abdomen was done which showed subacute perforated diverticulitis of the sigmoid colon with possible developing phlegmon and features concerning for colovesical fistula with perinephric stranding.  Patient also had leukocytosis and AKI with anemia.  Patient was started on IV empiric antibiotic after obtaining cultures.  Chest x-ray showed COPD.  General surgery was consulted and patient was admitted hospital for further evaluation and treatment.    Assessment and plan  Principal Problem:   Diverticulitis of colon with perforation Active Problems:   Polycythemia   Gout   Type 2 diabetes mellitus with hyperglycemia ([place])   Hypertension   Perforated sigmoid diverticulitis with possible developing phlegmon possible colovesical fistula. General surgery on board.  Patient is undergoing conservative treatment with symptomatic improvement.  Blood cultures negative in 3 days.  Leukocytosis trending down from initial 24.4 to 13.5 >7.3 today.  Continue IV Zosyn. Communicated with Dr. Harlow Asa surgery at bedside.  Recommends continuation of clear liquids and discussion with colorectal surgery/urology in a.m.   Possible ascending infection involving the urinary tract with perinephric stranding on scan.  Continue empiric IV antibiotic.  Blood cultures negative in 2 days.  Mild hypokalemia.  Improved on half-normal saline with KCl.  We will continue.   Pulmonary centrilobular  groundglass infiltrate in the right lung base.   Seen on CT scan of the abdomen. On empiric antibiotics.  COVID 19 was negative.  No pulmonary symptoms.  Nonhypoxic.   Acute renal failure  Improved.  Creatinine on presentation at 2.6.  Creatinine today at 0.7.  CT abdomen mild left hydronephrosis and perinephric inflammatory changes.  Continue to monitor renal function.  mild hyponatremia  We will change IV fluids to normal saline.  Check levels in AM.  Sodium 130 today.  Anemia  Appears to be new.  Had a colonoscopy in September with Diverticulosis and polyps were removed.  Vitamin B12 at 187 and borderline low.  Folic acid 48.1 within normal range.  Iron panel shows serum iron low at 13 with low TIBC.  Ferritin elevated at 879 but patient has acute infection.   Thrombocytopenia  Patient admitted for sepsis and previous history of alcoholism.  Continue to monitor.  Platelet clumps seen   Prior history of alcoholism  Has not drank since June 2020    Diabetes mellitus type 2  Latest hemoglobin A1c of 8.0.  Continue sliding scale insulin for now.  Latest POC glucose of 107.  Patient is on metformin at home.  We will continue to hold.   History of hypertension  Resume home hydralazine p.o. 3 times daily.  Patient is on hydralazine, losartan and spironolactone at home.   History of gout .  Uric acid at 6.4.      DVT prophylaxis: SCDs Start: 12/01/21 0211   Code Status:     Code Status: Full Code  Disposition: Home  Status is: Inpatient  Remains inpatient appropriate because: IV antibiotic, surgical follow-up   Family Communication:  Spoke with the patient's  brother at bedside on 12/01/2021 I spoke with the significant other on 12/03/2021  Consultants:  General surgery  Procedures:  None   Antimicrobials:  Zosyn 11/30/2021>  Anti-infectives (From admission, onward)    Start     Dose/Rate Route Frequency Ordered Stop   12/01/21 0600  piperacillin-tazobactam (ZOSYN) IVPB  3.375 g        3.375 g 12.5 mL/hr over 240 Minutes Intravenous Every 8 hours 12/01/21 0216     11/30/21 2300  piperacillin-tazobactam (ZOSYN) IVPB 3.375 g        3.375 g 100 mL/hr over 30 Minutes Intravenous  Once 11/30/21 2258 12/01/21 0000      Subjective: Today, patient was seen and examined at bedside.  Feels better.  Denies any nausea vomiting or abdominal pain.  Has not had a bowel movement.  General surgery at bedside.    Vitals:   12/02/21 1612 12/02/21 2042 12/03/21 0419 12/03/21 0729  BP: 123/78 133/68 (!) 143/73 (!) 140/67  Pulse: 100 96 93 97  Resp: '17 16 18 17  '$ Temp: 98 F (36.7 C) 99.5 F (37.5 C) 99.9 F (37.7 C) 99.8 F (37.7 C)  TempSrc: Oral Oral Oral Oral  SpO2: 98% 94% 92% 95%  Weight:      Height:        Intake/Output Summary (Last 24 hours) at 12/03/2021 1053 Last data filed at 12/03/2021 0734 Gross per 24 hour  Intake 669.39 ml  Output 900 ml  Net -230.61 ml    Filed Weights   11/30/21 2255 12/02/21 0459  Weight: 97.5 kg 97.5 kg    Physical Examination: Body mass index is 26.87 kg/m.   General:  Average built, not in obvious distress, Communicative, alert awake, HENT:   No scleral pallor or icterus noted. Oral mucosa is moist.  Nasal area with fungating lesion. Chest:  Clear breath sounds. No crackles or wheezes.  CVS: S1 &S2 heard. No murmur.  Regular rate and rhythm. Abdomen: Soft, bowel sounds present, mild tenderness over the left lower quadrant.  Much improved. Extremities: No cyanosis, clubbing or edema.  Peripheral pulses are palpable. Psych: Alert, awake and oriented, normal mood CNS:  No cranial nerve deficits.  Power equal in all extremities.   Skin: Warm and dry.  No rashes noted.  Data Reviewed:   CBC: Recent Labs  Lab 11/30/21 1858 12/01/21 0344 12/02/21 0220 12/03/21 0928  WBC 24.4* 18.8* 13.5* 7.3  NEUTROABS 20.9*  --   --   --   HGB 11.0* 9.9* 9.2* 9.6*  HCT 34.1* 31.6* 28.5* 29.1*  MCV 89.7 89.8 88.0 86.1  PLT  147* PLATELET CLUMPS NOTED ON SMEAR, UNABLE TO ESTIMATE PLATELET CLUMPS NOTED ON SMEAR, UNABLE TO ESTIMATE PLATELET CLUMPS NOTED ON SMEAR, UNABLE TO ESTIMATE     Basic Metabolic Panel: Recent Labs  Lab 11/30/21 1858 12/01/21 0344 12/02/21 0220 12/03/21 0928  NA 134* 134* 134* 130*  K 3.8 3.8 3.3* 3.6  CL 97* 99 102 96*  CO2 '22 24 24 24  '$ GLUCOSE 175* 127* 102* 110*  BUN 33* 37* 31* 11  CREATININE 2.06* 2.10* 1.20 0.78  CALCIUM 8.7* 8.1* 8.1* 8.0*  MG  --   --  2.0  --      Liver Function Tests: Recent Labs  Lab 11/30/21 1858 12/01/21 0344  AST 18 18  ALT 9 10  ALKPHOS 53 49  BILITOT 0.7 0.8  PROT 7.3 6.9  ALBUMIN 2.7* 2.3*      Radiology Studies: No  results found.    LOS: 3 days    Flora Lipps, MD [facility] Available via Epic secure chat 7am-7pm After these hours, please refer to coverage provider listed on [email or web address] 12/03/2021, 10:53 AM

## 2021-12-04 DIAGNOSIS — E876 Hypokalemia: Secondary | ICD-10-CM | POA: Insufficient documentation

## 2021-12-04 LAB — CBC
HCT: 29.7 % — ABNORMAL LOW (ref 39.0–52.0)
Hemoglobin: 9.6 g/dL — ABNORMAL LOW (ref 13.0–17.0)
MCH: 28.1 pg (ref 26.0–34.0)
MCHC: 32.3 g/dL (ref 30.0–36.0)
MCV: 86.8 fL (ref 80.0–100.0)
Platelets: 118 10*3/uL — ABNORMAL LOW (ref 150–400)
RBC: 3.42 MIL/uL — ABNORMAL LOW (ref 4.22–5.81)
RDW: 13.7 % (ref 11.5–15.5)
WBC: 7.7 10*3/uL (ref 4.0–10.5)
nRBC: 0 % (ref 0.0–0.2)

## 2021-12-04 LAB — GLUCOSE, CAPILLARY
Glucose-Capillary: 106 mg/dL — ABNORMAL HIGH (ref 70–99)
Glucose-Capillary: 112 mg/dL — ABNORMAL HIGH (ref 70–99)
Glucose-Capillary: 114 mg/dL — ABNORMAL HIGH (ref 70–99)
Glucose-Capillary: 192 mg/dL — ABNORMAL HIGH (ref 70–99)
Glucose-Capillary: 94 mg/dL (ref 70–99)
Glucose-Capillary: 98 mg/dL (ref 70–99)
Glucose-Capillary: 99 mg/dL (ref 70–99)

## 2021-12-04 LAB — COMPREHENSIVE METABOLIC PANEL
ALT: 12 U/L (ref 0–44)
AST: 17 U/L (ref 15–41)
Albumin: 1.9 g/dL — ABNORMAL LOW (ref 3.5–5.0)
Alkaline Phosphatase: 56 U/L (ref 38–126)
Anion gap: 9 (ref 5–15)
BUN: 6 mg/dL — ABNORMAL LOW (ref 8–23)
CO2: 24 mmol/L (ref 22–32)
Calcium: 8.2 mg/dL — ABNORMAL LOW (ref 8.9–10.3)
Chloride: 99 mmol/L (ref 98–111)
Creatinine, Ser: 0.76 mg/dL (ref 0.61–1.24)
GFR, Estimated: >60 mL/min (ref >60)
Glucose, Bld: 96 mg/dL (ref 70–99)
Potassium: 3.2 mmol/L — ABNORMAL LOW (ref 3.5–5.1)
Sodium: 132 mmol/L — ABNORMAL LOW (ref 135–145)
Total Bilirubin: 1.2 mg/dL (ref 0.3–1.2)
Total Protein: 6 g/dL — ABNORMAL LOW (ref 6.5–8.1)

## 2021-12-04 LAB — MAGNESIUM: Magnesium: 1.3 mg/dL — ABNORMAL LOW (ref 1.7–2.4)

## 2021-12-04 MED ORDER — OXYCODONE HCL 5 MG PO TABS: 5.0000 mg | ORAL_TABLET | ORAL | Status: DC | PRN

## 2021-12-04 MED ORDER — MELATONIN 3 MG PO TABS
3.0000 mg | ORAL_TABLET | Freq: Once | ORAL | Status: AC
  Administered 2021-12-04: 3 mg via ORAL
  Filled 2021-12-04: qty 1

## 2021-12-04 MED ORDER — POTASSIUM CHLORIDE CRYS ER 20 MEQ PO TBCR
40.0000 meq | EXTENDED_RELEASE_TABLET | Freq: Once | ORAL | Status: AC
  Administered 2021-12-04: 40 meq via ORAL
  Filled 2021-12-04: qty 2

## 2021-12-04 MED ORDER — HYDROMORPHONE HCL 1 MG/ML IJ SOLN: 0.5000 mg | INTRAMUSCULAR | Status: DC | PRN

## 2021-12-04 MED ORDER — MAGNESIUM SULFATE 2 GM/50ML IV SOLN
2.0000 g | Freq: Once | INTRAVENOUS | Status: AC
  Administered 2021-12-04: 2 g via INTRAVENOUS
  Filled 2021-12-04: qty 50

## 2021-12-04 MED ORDER — ACETAMINOPHEN 500 MG PO TABS: 1000.0000 mg | ORAL_TABLET | Freq: Four times a day (QID) | ORAL | Status: DC | PRN

## 2021-12-04 MED ORDER — MAGNESIUM OXIDE -MG SUPPLEMENT 400 (240 MG) MG PO TABS
400.0000 mg | ORAL_TABLET | Freq: Two times a day (BID) | ORAL | Status: DC
  Administered 2021-12-04 – 2021-12-05 (×3): 400 mg via ORAL
  Filled 2021-12-04 (×3): qty 1

## 2021-12-04 MED ORDER — POTASSIUM CHLORIDE 10 MEQ/100ML IV SOLN
10.0000 meq | INTRAVENOUS | Status: AC
  Administered 2021-12-04 (×4): 10 meq via INTRAVENOUS
  Filled 2021-12-04 (×4): qty 100

## 2021-12-04 NOTE — Progress Notes (Signed)
Mobility Specialist - Progress Note   12/04/21 1015  Mobility  Activity Ambulated independently in hallway  Level of Assistance Modified independent, requires aide device or extra time  Assistive Device Other (Comment) (IV pole)  Distance Ambulated (ft) 300 ft  Activity Response Tolerated well  $Mobility charge 1 Mobility    Pt received in bed agreeable to mobility. Left in bed w/ call bell in reach and all needs met.   Paulla Dolly Mobility Specialist

## 2021-12-04 NOTE — TOC Initial Note (Signed)
Transition of Care [facility]) - Initial/Assessment Note    Patient Details  Name: Donald Bender MRN: [id number] Date of Birth: 1960/05/30  Transition of Care [facility]) CM/SW Contact:    Marilu Favre, RN Phone Number: 12/04/2021, 12:50 PM  Clinical Narrative:                 Spoke to patient at bedside.   Confirmed face sheet information.   Patient uses pharmacy at [facility].   PCP Is DR Margarita Rana   Expected Discharge Plan: Home/Self Care Barriers to Discharge: Continued Medical Work up   Patient Goals and CMS Choice Patient states their goals for this hospitalization and ongoing recovery are:: to return to home      Expected Discharge Plan and Services Expected Discharge Plan: Home/Self Care   Discharge Planning Services: CM Consult                     DME Arranged: N/A         HH Arranged: NA          Prior Living Arrangements/Services   Lives with:: Significant Other Patient language and need for interpreter reviewed:: Yes Do you feel safe going back to the place where you live?: Yes      Need for Family Participation in Patient Care: Yes (Comment) Care giver support system in place?: Yes (comment)   Criminal Activity/Legal Involvement Pertinent to Current Situation/Hospitalization: No - Comment as needed  Activities of Daily Living   ADL Screening (condition at time of admission) Patient's cognitive ability adequate to safely complete daily activities?: Yes Is the patient deaf or have difficulty hearing?: No Does the patient have difficulty seeing, even when wearing glasses/contacts?: No Does the patient have difficulty concentrating, remembering, or making decisions?: No Patient able to express need for assistance with ADLs?: Yes Does the patient have difficulty dressing or bathing?: No Independently performs ADLs?: Yes (appropriate for developmental age) Does the patient have difficulty walking or climbing stairs?: Yes Weakness  of Legs: Both Weakness of Arms/Hands: Both  Permission Sought/Granted   Permission granted to share information with : No              Emotional Assessment Appearance:: Appears stated age Attitude/Demeanor/Rapport: Engaged Affect (typically observed): Accepting Orientation: : Oriented to Self, Oriented to Place, Oriented to  Time, Oriented to Situation Alcohol / Substance Use: Not Applicable Psych Involvement: No (comment)  Admission diagnosis:  Diverticulitis of colon with perforation [K57.20] Patient Active Problem List   Diagnosis Date Noted   Hypokalemia 12/04/2021   Hypomagnesemia 12/04/2021   Type 2 diabetes mellitus with hyperglycemia ([place]) 12/01/2021   Hypertension 12/01/2021   Diverticulitis of colon with perforation 11/30/2021   Diverticulitis of large intestine with perforation 07/25/2021   Hypertensive urgency 07/25/2021   SIRS (systemic inflammatory response syndrome) ([place]) 07/25/2021   Leukocytosis 07/25/2021   Tobacco abuse 07/25/2021   Alcohol abuse 07/25/2021   Does not have health insurance 07/25/2021   Inguinal hernia 07/25/2021   Obesity (BMI 30-39.9) 07/25/2021   Acute respiratory failure with hypoxia ([place]) 07/25/2021   Polycythemia    Cellulitis 03/18/2020   Hypertriglyceridemia 07/06/2016   Gout 11/16/2015   Essential hypertension 09/11/2013   Type 2 diabetes mellitus without complication, without long-term current use of insulin ([place]) 09/11/2013   PCP:  Charlott Rakes, MD Pharmacy:   [facility], [place] [address] Phone: [phone] Fax:  [place] ([place]), [place] - [address] [place] [address] Phone: [phone] Fax: [facility] [address] [place] [address] Phone: [phone] Fax: [phone]     Social Determinants  of Health (SDOH) Interventions    Readmission Risk Interventions     No data to display

## 2021-12-04 NOTE — Progress Notes (Signed)
Subjective: CC: No abdominal pain. Not requiring prn pain meds. Tolerating cld without n/v. Passing flatus. No bm in several days. Diarrhea before coming in. Voiding, having pneumaturia. No flecks of stool in urine.   Objective: Vital signs in last 24 hours: Temp:  [98 F (36.7 C)-98.4 F (36.9 C)] 98.4 F (36.9 C) (11/06 0437) Pulse Rate:  [80-84] 80 (11/06 0437) Resp:  [16-20] 16 (11/06 0437) BP: (129-142)/(69-78) 134/69 (11/06 0437) SpO2:  [95 %-96 %] 96 % (11/06 0437) Last BM Date : 12/01/21  Intake/Output from previous day: 11/05 0701 - 11/06 0700 In: -  Out: 300 [Urine:300] Intake/Output this shift: No intake/output data recorded.  PE: Gen:  Alert, NAD, pleasant Pulm:  Rate and effort normal Abd: Soft, ND, mild suprapubic and left sided abdominal ttp without rigidity or guarding. +BS Psych: A&Ox3   Lab Results:  Recent Labs    12/03/21 0928 12/04/21 0321  WBC 7.3 7.7  HGB 9.6* 9.6*  HCT 29.1* 29.7*  PLT PLATELET CLUMPS NOTED ON SMEAR, UNABLE TO ESTIMATE 118*   BMET Recent Labs    12/03/21 0928 12/04/21 0321  NA 130* 132*  K 3.6 3.2*  CL 96* 99  CO2 24 24  GLUCOSE 110* 96  BUN 11 6*  CREATININE 0.78 0.76  CALCIUM 8.0* 8.2*   PT/INR No results for input(s): "LABPROT", "INR" in the last 72 hours. CMP     Component Value Date/Time   NA 132 (L) 12/04/2021 0321   NA 137 06/02/2020 1450   K 3.2 (L) 12/04/2021 0321   CL 99 12/04/2021 0321   CO2 24 12/04/2021 0321   GLUCOSE 96 12/04/2021 0321   BUN 6 (L) 12/04/2021 0321   BUN 48 (H) 06/02/2020 1450   CREATININE 0.76 12/04/2021 0321   CREATININE 0.64 (L) 11/21/2015 0915   CALCIUM 8.2 (L) 12/04/2021 0321   PROT 6.0 (L) 12/04/2021 0321   PROT 7.3 06/02/2020 1450   ALBUMIN 1.9 (L) 12/04/2021 0321   ALBUMIN 4.3 06/02/2020 1450   AST 17 12/04/2021 0321   ALT 12 12/04/2021 0321   ALKPHOS 56 12/04/2021 0321   BILITOT 1.2 12/04/2021 0321   BILITOT 0.9 06/02/2020 1450   GFRNONAA >60  12/04/2021 0321   GFRNONAA >89 11/21/2015 0915   GFRAA 136 01/24/2017 1512   GFRAA >89 11/21/2015 0915   Lipase     Component Value Date/Time   LIPASE 49 07/24/2021 1929    Studies/Results: No results found.  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, patient was admitted in June 2023 and managed conservatively - outpatient colonoscopy 10/17/21 which revealed multiple polyps that were tubular adenomas, a lipoma in the transverse colon, diverticulosis in the sigmoid colon, and congested mucosa in the sigmoid colon; he was advised to follow up in 3 years for repeat colonoscopy - returns 11/2, CT with extensive inflammatory changes involving the sigmoid colon in a background of severe diverticulosis; evolving pericolonic phlegmon  without discrete drainable fluid collection; interval development of a colovesicular fistula with perivesicular inflammatory changes; mild left hydronephrosis and left perinephric inflammatory changes have developed suggesting ascending left-sided infection - I do not think he needs emergency surgery at this time. If patient fails to improve they may require repeating imaging, drain placement, or surgical intervention resulting in a colectomy/colostomy.  This was discussed with the patient. - Patient appears to be clinically improving. WBC wnl. Pain improving, tolerating liquids, exam reassuring. Will advance diet. If continues to improve, hopefully home in the next 24-48 hours on course of oral  abx and colorectal Surgery f/u   ID - Zosyn. Afebrile (Tmax 99.8, not on scheduled antipyretics). No tachycardia or hypotension. WBC 7.7 VTE - SCDs, ok for chemical dvt ppx from surgical standpoint FEN - FLD. Consider soft diet later today vs am. IVF per TRH Foley - none   AKI - resolved Left hydronephrosis Pulmonary centrilobular groundglass infiltrate in the right lung base  Anemia Thrombocytopenia DM HTN Hx gout Tobacco abuse  Prior alcohol abuse  Bilateral inguinal hernias   LOS: 4 days    Jillyn Ledger , [facility] Surgery 12/04/2021, 8:09 AM Please see Amion for pager number during day hours 7:00am-4:30pm

## 2021-12-04 NOTE — Progress Notes (Signed)
Pharmacy Antibiotic Note  Donald Bender is a 61 y.o. male admitted on 11/30/2021 with perforated diverticulum on antibiotics for  intra-abdominal infection .  Pharmacy has been consulted for Zosyn dosing. *No surgery this admission so no definitive source control.   CBC improved. Afebrile.  AKI resolved.  Cultures no growth to date (finalizing tomorrow).   Plan: Continue Zosyn 3.375G IV q8h to be infused over 4 hours Monitor renal function and clinical status Follow-up plan for length of therapy per surgical team.    Height: '6\' 3"'$  (190.5 cm) Weight: 97.5 kg (214 lb 15.2 oz) IBW/kg (Calculated) : 84.5  Temp (24hrs), Avg:98.3 F (36.8 C), Min:98 F (36.7 C), Max:98.4 F (36.9 C)  Recent Labs  Lab 11/30/21 1854 11/30/21 1858 12/01/21 0344 12/02/21 0220 12/03/21 0928 12/04/21 0321  WBC  --  24.4* 18.8* 13.5* 7.3 7.7  CREATININE  --  2.06* 2.10* 1.20 0.78 0.76  LATICACIDVEN 2.9*  --   --   --   --   --     Estimated Creatinine Clearance: 115.9 mL/min (by C-G formula based on SCr of 0.76 mg/dL).    No Known Allergies  Sloan Leiter, PharmD, BCPS, BCCCP Clinical Pharmacist Please refer to [facility] for [place] numbers 12/04/2021, 10:47 AM

## 2021-12-04 NOTE — Progress Notes (Addendum)
PROGRESS NOTE    Donald Bender  PPJ:[id number] DOB: 1960-09-06 DOA: 11/30/2021 PCP: Charlott Rakes, MD    Brief Narrative:   Donald Bender is a 61 y.o. male with past medical history of type 2 diabetes, hypertension, gout and history of perforated diverticulitis which was managed conservatively in June, presented to the hospital with abdominal pain for several months which was persistent.  Of note,patient had a colonoscopy in September which showed diverticulosis and polyps and lipoma which was removed.  In the ED, CT scan abdomen was done which showed subacute perforated diverticulitis of the sigmoid colon with possible developing phlegmon and features concerning for colovesical fistula with perinephric stranding.  Patient also had leukocytosis and AKI with anemia.  Patient was started on IV empiric antibiotic after obtaining cultures.  Chest x-ray showed COPD.  General surgery was consulted and patient was admitted hospital for further evaluation and treatment.    Assessment and plan  Principal Problem:   Diverticulitis of colon with perforation Active Problems:   Polycythemia   Gout   Type 2 diabetes mellitus with hyperglycemia ([place])   Hypertension   Hypokalemia   Hypomagnesemia   Perforated sigmoid diverticulitis with possible developing phlegmon possible colovesical fistula. General surgery on board.  Patient is undergoing conservative treatment with symptomatic improvement.  Blood cultures negative in 4 days.  Leukocytosis trending down from initial 24.4 to 13.5 >7.3>7.7 today.  Continue IV Zosyn. on clear liquids and surgery planning discussion with colorectal surgery/urology.  General surgery has advance diet to full liquids today.  Possible ascending infection involving the urinary tract with perinephric stranding on scan.  Continue empiric IV antibiotic.  Blood cultures negative in 4 days.  Mild hypokalemia.  Continue KCl replacement.  Potassium of 3.2 today.  Add IV and p.o.  potassium supplements.   Pulmonary centrilobular groundglass infiltrate in the right lung base.   Seen on CT scan of the abdomen. On empiric antibiotics.  COVID 19 was negative.  No pulmonary symptoms.  Nonhypoxic.   Acute renal failure  Improved.  Creatinine on presentation at 2.6.  Creatinine today at 0.7.  CT abdomen mild left hydronephrosis and perinephric inflammatory changes.  We will discontinue IV fluids.  mild hyponatremia  Sodium of 02/27/2018.  We will discontinue normal saline.  Continue to monitor.  Hypomagnesemia.  We will replenish through IV magnesium sulfate 2 g.  Add p.o. magnesium oxide as well.  Anemia  Had a colonoscopy in September with Diverticulosis and polyps were removed.  Vitamin B12 at 187 and borderline low.  Folic acid 58.0 within normal range.  Iron panel shows serum iron low at 13 with low TIBC.  Ferritin elevated at 879 but patient has acute infection.  Latest hemoglobin of 9.6.   Thrombocytopenia  Patient admitted for sepsis and previous history of alcoholism.  Continue to monitor.  Platelets at 118 today.   Prior history of alcoholism  Has not drank since June 2020    Diabetes mellitus type 2  Latest hemoglobin A1c of 8.0.  Continue sliding scale insulin for now.  Latest POC glucose of 98, hold metformin from home.    History of hypertension  Continue hydralazine p.o. 3 times daily.  Patient is on hydralazine, losartan and spironolactone at home.   History of gout  Uric acid at 6.4.  No acute flares     DVT prophylaxis: SCDs Start: 12/01/21 0211   Code Status:     Code Status: Full Code  Disposition: Home likely in 1  to 2 days if okay with general surgery.  Has been advanced to full liquids.  Status is: Inpatient  Remains inpatient appropriate because: IV antibiotic, surgical follow-up    Family Communication:  Spoke with significant other on 12/04/2021 at bedside and answered all the questions.  Consultants:  General  surgery  Procedures:  None   Antimicrobials:  Zosyn 11/30/2021>  Anti-infectives (From admission, onward)    Start     Dose/Rate Route Frequency Ordered Stop   12/01/21 0600  piperacillin-tazobactam (ZOSYN) IVPB 3.375 g        3.375 g 12.5 mL/hr over 240 Minutes Intravenous Every 8 hours 12/01/21 0216     11/30/21 2300  piperacillin-tazobactam (ZOSYN) IVPB 3.375 g        3.375 g 100 mL/hr over 30 Minutes Intravenous  Once 11/30/21 2258 12/01/21 0000      Subjective: Today, patient was seen and examined at bedside.  Patient denies any nausea vomiting fever chills or rigor.  Has mild left lower abdomen discomfort.  No bowel movements.   Vitals:   12/03/21 1649 12/03/21 1959 12/04/21 0437 12/04/21 0812  BP: 129/78 (!) 142/75 134/69 128/69  Pulse: 84 83 80 85  Resp: '18 20 16 19  '$ Temp: 98 F (36.7 C) 98.3 F (36.8 C) 98.4 F (36.9 C) 98.3 F (36.8 C)  TempSrc:  Oral Oral Oral  SpO2: 95% 95% 96% 95%  Weight:      Height:       No intake or output data in the 24 hours ending 12/04/21 1024  Filed Weights   11/30/21 2255 12/02/21 0459  Weight: 97.5 kg 97.5 kg    Physical Examination: Body mass index is 26.87 kg/m.   General:  Average built, not in obvious distress, alert awake HENT:   No scleral pallor or icterus noted. Oral mucosa is moist.  Nasal area with fungating lesions Chest:  Clear breath sounds.  Diminished breath sounds bilaterally. No crackles or wheezes.  CVS: S1 &S2 heard. No murmur.  Regular rate and rhythm. Abdomen: Soft, mildly tender on deep palpation on the left lower quadrant. Extremities: No cyanosis, clubbing or edema.  Peripheral pulses are palpable. Psych: Alert, awake and oriented, normal mood CNS:  No cranial nerve deficits.  Power equal in all extremities.   Skin: Warm and dry.  No rashes noted.  Data Reviewed:   CBC: Recent Labs  Lab 11/30/21 1858 12/01/21 0344 12/02/21 0220 12/03/21 0928 12/04/21 0321  WBC 24.4* 18.8* 13.5* 7.3  7.7  NEUTROABS 20.9*  --   --   --   --   HGB 11.0* 9.9* 9.2* 9.6* 9.6*  HCT 34.1* 31.6* 28.5* 29.1* 29.7*  MCV 89.7 89.8 88.0 86.1 86.8  PLT 147* PLATELET CLUMPS NOTED ON SMEAR, UNABLE TO ESTIMATE PLATELET CLUMPS NOTED ON SMEAR, UNABLE TO ESTIMATE PLATELET CLUMPS NOTED ON SMEAR, UNABLE TO ESTIMATE 118*    Basic Metabolic Panel: Recent Labs  Lab 11/30/21 1858 12/01/21 0344 12/02/21 0220 12/03/21 0928 12/04/21 0321  NA 134* 134* 134* 130* 132*  K 3.8 3.8 3.3* 3.6 3.2*  CL 97* 99 102 96* 99  CO2 '22 24 24 24 24  '$ GLUCOSE 175* 127* 102* 110* 96  BUN 33* 37* 31* 11 6*  CREATININE 2.06* 2.10* 1.20 0.78 0.76  CALCIUM 8.7* 8.1* 8.1* 8.0* 8.2*  MG  --   --  2.0  --  1.3*    Liver Function Tests: Recent Labs  Lab 11/30/21 1858 12/01/21 0344 12/04/21 0321  AST '18 18 17  '$ ALT '9 10 12  '$ ALKPHOS 53 49 56  BILITOT 0.7 0.8 1.2  PROT 7.3 6.9 6.0*  ALBUMIN 2.7* 2.3* 1.9*     Radiology Studies: No results found.    LOS: 4 days    Flora Lipps, MD [facility] Available via Epic secure chat 7am-7pm After these hours, please refer to coverage provider listed on [email or web address] 12/04/2021, 10:24 AM

## 2021-12-05 ENCOUNTER — Other Ambulatory Visit: Payer: Self-pay

## 2021-12-05 DIAGNOSIS — E876 Hypokalemia: Secondary | ICD-10-CM

## 2021-12-05 LAB — CULTURE, BLOOD (ROUTINE X 2)
Culture: NO GROWTH
Culture: NO GROWTH

## 2021-12-05 LAB — BASIC METABOLIC PANEL
Anion gap: 9 (ref 5–15)
BUN: 8 mg/dL (ref 8–23)
CO2: 26 mmol/L (ref 22–32)
Calcium: 8.5 mg/dL — ABNORMAL LOW (ref 8.9–10.3)
Chloride: 102 mmol/L (ref 98–111)
Creatinine, Ser: 0.71 mg/dL (ref 0.61–1.24)
GFR, Estimated: >60 mL/min (ref >60)
Glucose, Bld: 102 mg/dL — ABNORMAL HIGH (ref 70–99)
Potassium: 3.7 mmol/L (ref 3.5–5.1)
Sodium: 137 mmol/L (ref 135–145)

## 2021-12-05 LAB — CBC
HCT: 29.9 % — ABNORMAL LOW (ref 39.0–52.0)
Hemoglobin: 9.9 g/dL — ABNORMAL LOW (ref 13.0–17.0)
MCH: 28.4 pg (ref 26.0–34.0)
MCHC: 33.1 g/dL (ref 30.0–36.0)
MCV: 85.7 fL (ref 80.0–100.0)
Platelets: 78 10*3/uL — ABNORMAL LOW (ref 150–400)
RBC: 3.49 MIL/uL — ABNORMAL LOW (ref 4.22–5.81)
RDW: 13.8 % (ref 11.5–15.5)
WBC: 7.5 10*3/uL (ref 4.0–10.5)
nRBC: 0 % (ref 0.0–0.2)

## 2021-12-05 LAB — GLUCOSE, CAPILLARY
Glucose-Capillary: 118 mg/dL — ABNORMAL HIGH (ref 70–99)
Glucose-Capillary: 99 mg/dL (ref 70–99)

## 2021-12-05 LAB — MAGNESIUM: Magnesium: 1.6 mg/dL — ABNORMAL LOW (ref 1.7–2.4)

## 2021-12-05 MED ORDER — AMOXICILLIN-POT CLAVULANATE 875-125 MG PO TABS
1.0000 | ORAL_TABLET | Freq: Two times a day (BID) | ORAL | 0 refills | Status: AC
  Filled 2021-12-05: qty 20, 10d supply, fill #0

## 2021-12-05 NOTE — Discharge Summary (Addendum)
Physician Discharge Summary  Donald Bender OEV:[id number] DOB: 1960/06/29 DOA: 11/30/2021  PCP: Charlott Rakes, MD  Admit date: 11/30/2021 Discharge date: 12/05/2021  Admitted From: Home  Discharge disposition: Home   Recommendations for Outpatient Follow-Up:   Follow up with your primary care provider in one week.  Check CBC, BMP, magnesium in the next visit Follow-up with general surgery as outpatient as scheduled by the clinic to discuss about elective colorectal surgery in the future.   Discharge Diagnosis:   Principal Problem:   Diverticulitis of colon with perforation Active Problems:   Polycythemia   Gout   Type 2 diabetes mellitus with hyperglycemia (HCC)   Hypertension   Hypokalemia   Hypomagnesemia   Discharge Condition: Improved.  Diet recommendation: Low sodium, heart healthy.  Carbohydrate-modified.    Wound care: None.  Code status: Full.   History of Present Illness:   Donald Bender is a 61 y.o. male with past medical history of type 2 diabetes, hypertension, gout and history of perforated diverticulitis which was managed conservatively in June, presented to the hospital with abdominal pain for several months which was persistent.  Of note,patient had a colonoscopy in September which showed diverticulosis and polyps and lipoma which was removed.  In the ED, CT scan abdomen was done which showed subacute perforated diverticulitis of the sigmoid colon with possible developing phlegmon and features concerning for colovesical fistula with perinephric stranding.  Patient also had leukocytosis and AKI with anemia.  Patient was started on IV empiric antibiotic after obtaining cultures.  Chest x-ray showed COPD.  General surgery was consulted and patient was admitted hospital for further evaluation and treatment.     Hospital Course:   Following conditions were addressed during hospitalization as listed below,  Perforated sigmoid diverticulitis with possible  developing phlegmon possible colovesical fistula. General surgery was consulted and patient was treated conservatively with IV Zosyn and supportive care.  Blood cultures were negative.  Patient had significant leukocytosis on presentation which has normalized at this time.  General surgery has seen the patient today and recommend Augmentin for next 10 days to complete 14-day course of antibiotic.  Plan is to follow-up with surgery as outpatient for elective colorectal surgery as outpatient.    Severe sepsis with organ dysfunction secondary to perforated sigmoid diverticulitis as evidenced by acute kidney injury and elevated lactate, tachycardia, tachypnea, leukocytosis.  Improved   Possible ascending infection involving the urinary tract with perinephric stranding on scan.  Blood cultures negative renal function improved.  Will need outpatient follow-up with surgery.   Hypokalemia.  Improved after replacement.  Potassium prior to discharge was 3.7.   Pulmonary centrilobular groundglass infiltrate in the right lung base.   Seen on CT scan of the abdomen.  Received empiric antibiotic during hospitalization.  COVID 19 was negative.  No pulmonary symptoms.  Nonhypoxic.   Acute renal failure  Improved.  Creatinine on presentation at 2.6.  Creatinine prior to discharge was 0.7.  CT abdomen mild left hydronephrosis and perinephric inflammatory changes.  Will need outpatient follow-up with general surgery.  mild hyponatremia  Improved after IV fluids.  Sodium prior to discharge was 137.   Hypomagnesemia.  Replenished while in the hospital.  We will continue oral magnesium on discharge.   Anemia  Had a colonoscopy in September with Diverticulosis and polyps were removed.  Vitamin B12 at 187 and borderline low.  Folic acid 82.9 within normal range.  Iron panel shows serum iron low at 13 with low TIBC.  Ferritin elevated at 879 but patient has acute infection.  Latest hemoglobin of 9.9.    Thrombocytopenia  Patient admitted for sepsis and previous history of alcoholism.  Continue to monitor.  Platelets at 78 K.   Prior history of alcoholism  Has not drank since June 2020    Diabetes mellitus type 2  Latest hemoglobin A1c of 8.0.  Will resume metformin from home.     History of hypertension  Resume hydralazine, losartan and spironolactone at home.   History of gout  Uric acid at 6.4.  No acute flares  Disposition.  At this time, patient is stable for disposition home with outpatient PCP and general surgery follow-up.  Spoke with patient's significant other at bedside.  Medical Consultants:   General surgery Procedures:    None Subjective:   Today, patient was seen and examined at bedside.  Denies any nausea vomiting abdominal pain fever or chills.  Seen by general surgery and okay for discharge home.  Discharge Exam:   Vitals:   12/05/21 0508 12/05/21 0735  BP: 134/75 133/83  Pulse: 70 85  Resp: 18 17  Temp: 98.5 F (36.9 C) 98.2 F (36.8 C)  SpO2: 97% 95%   Vitals:   12/04/21 1632 12/04/21 2034 12/05/21 0508 12/05/21 0735  BP: 136/86 (!) 142/70 134/75 133/83  Pulse: 79 84 70 85  Resp: '18 15 18 17  '$ Temp: 98.4 F (36.9 C) 98.1 F (36.7 C) 98.5 F (36.9 C) 98.2 F (36.8 C)  TempSrc: Oral Oral Oral Oral  SpO2: 96% 95% 97% 95%  Weight:      Height:       General: Alert awake, not in obvious distress HENT: pupils equally reacting to light,  No scleral pallor or icterus noted. Oral mucosa is moist.  Fungating lesions on the nasal area. Chest:  Clear breath sounds.  Diminished breath sounds bilaterally. No crackles or wheezes.  CVS: S1 &S2 heard. No murmur.  Regular rate and rhythm. Abdomen: Soft, nontender, nondistended.  Bowel sounds are heard.  Mild tenderness on deep palpation of the left lower quadrant. Extremities: No cyanosis, clubbing or edema.  Peripheral pulses are palpable. Psych: Alert, awake and oriented, normal mood CNS:  No cranial  nerve deficits.  Power equal in all extremities.   Skin: Warm and dry.  No rashes noted.  The results of significant diagnostics from this hospitalization (including imaging, microbiology, ancillary and laboratory) are listed below for reference.     Diagnostic Studies:   CT ABDOMEN PELVIS WO CONTRAST  Result Date: 11/30/2021 CLINICAL DATA:  Abdominal pain, acute, nonlocalized EXAM: CT ABDOMEN AND PELVIS WITHOUT CONTRAST TECHNIQUE: Multidetector CT imaging of the abdomen and pelvis was performed following the standard protocol without IV contrast. RADIATION DOSE REDUCTION: This exam was performed according to the departmental dose-optimization program which includes automated exposure control, adjustment of the mA and/or kV according to patient size and/or use of iterative reconstruction technique. COMPARISON:  07/25/2021 FINDINGS: Lower chest: Centrilobular ground-glass pulmonary infiltrate is seen within the visualized right lung base, possibly infectious or inflammatory in the acute setting. No pleural effusion. Moderate right coronary artery calcification. Hepatobiliary: Stable mild hepatomegaly. No intrahepatic mass identified on this noncontrast examination. No intra or extrahepatic biliary ductal dilation. Gallbladder unremarkable. Pancreas: Unremarkable Spleen: Unremarkable Adrenals/Urinary Tract: The adrenal glands are unremarkable. The kidneys are normal in size and position. Mild left hydronephrosis and perinephric inflammatory stranding has developed. No intrarenal or ureteral calculi. No hydronephrosis on the right. There is circumferential bladder  wall thickening and perivesicular inflammatory stranding as well as extensive gas within the bladder lumen. 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 The bladder is not distended. Stomach/Bowel:  Severe descending and sigmoid colonic diverticulosis again noted. Extensive pericolonic inflammatory changes are again seen involving the sigmoid colon, slightly improved since prior examination. There has developed, however, punctate foci of extraluminal gas both between the dome of the bladder and the affected region of sigmoid colon, described above, as well as within the a pericolonic inflammatory collection best seen on axial image # 61/3 and coronal image # 53/6 this phlegmonous collection appears relatively poorly defined no discrete drainable fluid component is identified. The stomach, small bowel, and large bowel are otherwise unremarkable. No evidence of obstruction. No free intraperitoneal gas or fluid. Appendix normal. Vascular/Lymphatic: Aortic atherosclerosis. No enlarged abdominal or pelvic lymph nodes. Reproductive: Prostate is unremarkable. Other: Moderate right and small left inguinal hernias are present with the right inguinal hernia sac containing a loop of unremarkable distal small bowel and the left hernia sac containing fat as well as perivesicular inflammatory fluid. Musculoskeletal: No acute bone abnormality. Degenerative changes are seen within the lumbar spine. No lytic or blastic bone lesion. IMPRESSION: 1. Extensive inflammatory changes involving the sigmoid colon in a background of severe diverticulosis, slightly improved since prior examination and in keeping with subacute perforated diverticulitis. Evolving pericolonic phlegmon without discrete drainable fluid collection identified. 2. Interval development of a colovesicular fistula with perivesicular inflammatory changes. Additionally, mild left hydronephrosis and left perinephric inflammatory changes have developed suggesting ascending left-sided infection. 3. Centrilobular ground-glass pulmonary infiltrate within the visualized right lung base, possibly infectious or inflammatory in the acute setting. 4. Stable mild hepatomegaly.  5. Moderate right coronary artery calcification. 6. Moderate right bowel containing and small left inguinal hernias. 7. Aortic atherosclerosis. Aortic Atherosclerosis (ICD10-I70.0). Electronically Signed   By: Fidela Salisbury M.D.   On: 11/30/2021 22:13   DG Chest 2 View  Result Date: 11/30/2021 CLINICAL DATA:  Dizziness, weakness EXAM: CHEST - 2 VIEW COMPARISON:  07/25/2021 FINDINGS: Cardiac size is within normal limits. Increase in AP diameter of chest suggests COPD. There are no signs of pulmonary edema or focal pulmonary consolidation. There is interval decrease in pulmonary vascular congestion. There is no pleural effusion or pneumothorax. IMPRESSION: COPD. There are no signs of pulmonary edema or focal pulmonary consolidation. Electronically Signed   By: Elmer Picker M.D.   On: 11/30/2021 19:53     Labs:   Basic Metabolic Panel: Recent Labs  Lab 12/01/21 0344 12/02/21 0220 12/03/21 0928 12/04/21 0321 12/05/21 0306  NA 134* 134* 130* 132* 137  K 3.8 3.3* 3.6 3.2* 3.7  CL 99 102 96* 99 102  CO2 '24 24 24 24 26  '$ GLUCOSE 127* 102* 110* 96 102*  BUN 37* 31* 11 6* 8  CREATININE 2.10* 1.20 0.78 0.76 0.71  CALCIUM 8.1* 8.1* 8.0* 8.2* 8.5*  MG  --  2.0  --  1.3* 1.6*   GFR Estimated Creatinine Clearance: 115.9 mL/min (by C-G formula based on SCr of 0.71 mg/dL). Liver Function Tests: Recent Labs  Lab 11/30/21 1858 12/01/21 0344 12/04/21 0321  AST '18 18 17  '$ ALT '9 10 12  '$ ALKPHOS 53 49 56  BILITOT 0.7 0.8 1.2  PROT 7.3 6.9 6.0*  ALBUMIN 2.7* 2.3* 1.9*   No results for input(s): "LIPASE", "AMYLASE" in the  last 168 hours. No results for input(s): "AMMONIA" in the last 168 hours. Coagulation profile No results for input(s): "INR", "PROTIME" in the last 168 hours.  CBC: Recent Labs  Lab 11/30/21 1858 12/01/21 0344 12/02/21 0220 12/03/21 0928 12/04/21 0321 12/05/21 0306  WBC 24.4* 18.8* 13.5* 7.3 7.7 7.5  NEUTROABS 20.9*  --   --   --   --   --   HGB 11.0* 9.9*  9.2* 9.6* 9.6* 9.9*  HCT 34.1* 31.6* 28.5* 29.1* 29.7* 29.9*  MCV 89.7 89.8 88.0 86.1 86.8 85.7  PLT 147* PLATELET CLUMPS NOTED ON SMEAR, UNABLE TO ESTIMATE PLATELET CLUMPS NOTED ON SMEAR, UNABLE TO ESTIMATE PLATELET CLUMPS NOTED ON SMEAR, UNABLE TO ESTIMATE 118* 78*   Cardiac Enzymes: No results for input(s): "CKTOTAL", "CKMB", "CKMBINDEX", "TROPONINI" in the last 168 hours. BNP: Invalid input(s): "POCBNP" CBG: Recent Labs  Lab 12/04/21 1631 12/04/21 2035 12/04/21 2347 12/05/21 0505 12/05/21 0736  GLUCAP 94 192* 112* 118* 99   D-Dimer No results for input(s): "DDIMER" in the last 72 hours. Hgb A1c No results for input(s): "HGBA1C" in the last 72 hours. Lipid Profile No results for input(s): "CHOL", "HDL", "LDLCALC", "TRIG", "CHOLHDL", "LDLDIRECT" in the last 72 hours. Thyroid function studies No results for input(s): "TSH", "T4TOTAL", "T3FREE", "THYROIDAB" in the last 72 hours.  Invalid input(s): "FREET3" Anemia work up No results for input(s): "VITAMINB12", "FOLATE", "FERRITIN", "TIBC", "IRON", "RETICCTPCT" in the last 72 hours. Microbiology Recent Results (from the past 240 hour(s))  Blood culture (routine x 2)     Status: None   Collection Time: 11/30/21  8:15 PM   Specimen: BLOOD RIGHT ARM  Result Value Ref Range Status   Specimen Description BLOOD RIGHT ARM  Final   Special Requests   Final    BOTTLES DRAWN AEROBIC AND ANAEROBIC Blood Culture results may not be optimal due to an excessive volume of blood received in culture bottles   Culture   Final    NO GROWTH 5 DAYS Performed at [facility], [place] [address], [place], [place] [address]    Report Status 12/05/2021 FINAL  Final  Blood culture (routine x 2)     Status: None   Collection Time: 11/30/21  8:38 PM   Specimen: BLOOD LEFT ARM  Result Value Ref Range Status   Specimen Description BLOOD LEFT ARM  Final   Special Requests   Final    BOTTLES DRAWN AEROBIC ONLY Blood Culture results may not be  optimal due to an inadequate volume of blood received in culture bottles   Culture   Final    NO GROWTH 5 DAYS Performed at [facility], [place] [address], [place],  [address]    Report Status 12/05/2021 FINAL  Final  SARS Coronavirus 2 by RT PCR (hospital order, performed in [facility] hospital lab) *cepheid single result test* Anterior Nasal Swab     Status: None   Collection Time: 12/01/21  5:00 AM   Specimen: Anterior Nasal Swab  Result Value Ref Range Status   SARS Coronavirus 2 by RT PCR NEGATIVE NEGATIVE Final    Comment: (NOTE) SARS-CoV-2 target nucleic acids are NOT DETECTED.  The SARS-CoV-2 RNA is generally detectable in upper and lower respiratory specimens during the acute phase of infection. The lowest concentration of SARS-CoV-2 viral copies this assay can detect is 250 copies / mL. A negative result does not preclude SARS-CoV-2 infection and should not be used as the sole basis for treatment or other  patient management decisions.  A negative result may occur with improper specimen collection / handling, submission of specimen other than nasopharyngeal swab, presence of viral mutation(s) within the areas targeted by this assay, and inadequate number of viral copies (<250 copies / mL). A negative result must be combined with clinical observations, patient history, and epidemiological information.  Fact Sheet for Patients:   https://www.patel.info/  Fact Sheet for Healthcare Providers: https://hall.com/  This test is not yet approved or  cleared by the [place] FDA and has been authorized for detection and/or diagnosis of SARS-CoV-2 by FDA under an Emergency Use Authorization (EUA).  This EUA will remain in effect (meaning this test can be used) for the duration of the COVID-19 declaration under Section 564(b)(1) of the Act, 21 U.S.C. section 360bbb-3(b)(1), unless the authorization is terminated or revoked  sooner.  Performed at [facility], [place] [address] [place], [place] [address]      Discharge Instructions:   Discharge Instructions     Call MD for:  persistant nausea and vomiting   Complete by: As directed    Call MD for:  severe uncontrolled pain   Complete by: As directed    Call MD for:  temperature >100.4   Complete by: As directed    Diet - low sodium heart healthy   Complete by: As directed    Discharge instructions   Complete by: As directed    Follow-up with your primary care provider in 1 week.  Check blood work at that time.  Follow-up with general surgery as outpatient as scheduled by the clinic.  Seek medical attention for worsening symptoms.  Complete the course of antibiotic as prescribed.   Increase activity slowly   Complete by: As directed       Allergies as of 12/05/2021   No Known Allergies      Medication List     STOP taking these medications    amLODipine 10 MG tablet Commonly known as: NORVASC   hydrALAZINE 25 MG tablet Commonly known as: APRESOLINE   hydroxypropyl methylcellulose / hypromellose 2.5 % ophthalmic solution Commonly known as: ISOPTO TEARS / GONIOVISC   ibuprofen 200 MG tablet Commonly known as: ADVIL   nicotine 14 mg/24hr patch Commonly known as: Nicoderm CQ   nicotine 21 mg/24hr patch Commonly known as: Nicoderm CQ   nicotine 7 mg/24hr patch Commonly known as: Nicoderm CQ   spironolactone 50 MG tablet Commonly known as: ALDACTONE       TAKE these medications    amoxicillin-clavulanate 875-125 MG tablet Commonly known as: AUGMENTIN Take 1 tablet by mouth 2 (two) times daily for 10 days.   aspirin EC 81 MG tablet Take 1 tablet (81 mg total) by mouth daily.   atorvastatin 40 MG tablet Commonly known as: LIPITOR Take 1 tablet (40 mg total) by mouth daily. For cholesterol   colchicine 0.6 MG tablet TAKE 2 TABLETS BY MOUTH AT THE ONSET OF A GOUT ATTACK, MAY REPEAT 1 TABLET IN 2 HOURS IF SYMPTOMS  PERSIST. What changed:  how much to take how to take this when to take this   Jardiance 25 MG Tabs tablet Generic drug: empagliflozin Take 1 tablet (25 mg total) by mouth daily before breakfast. What changed: when to take this   losartan 100 MG tablet Commonly known as: COZAAR Take 1 tablet (100 mg total) by mouth daily.   magnesium gluconate 500 MG tablet Commonly known as: MAGONATE Take 1,500 mg by mouth daily.  metFORMIN 1000 MG tablet Commonly known as: GLUCOPHAGE Take 1 tablet (1,000 mg total) by mouth 2 (two) times daily with a meal.   naproxen sodium 220 MG tablet Commonly known as: ALEVE Take 220 mg by mouth daily as needed (pain).        Follow-up Information     [facility] [place] Follow up.   Specialty: General Surgery Why: Please call to confirm your appointment date/time. We are working hard to make this for you. Please bring a copy of your photo ID & insurance card. Please arrive 30 minutes prior to your appointment for paperwork Contact information: [address] [place] [address] [phone]         Charlott Rakes, MD Follow up in 1 week(s).   Specialty: Family Medicine Why: regular followup, blood work Contact information: [place] [address] [phone]                  Time coordinating discharge: 39 minutes  Signed:  Rakeya Glab  [facility] 12/05/2021, 9:53 AM

## 2021-12-05 NOTE — Progress Notes (Signed)
Mobility Specialist - Progress Note   12/05/21 0931  Mobility  Activity Ambulated independently in hallway  Level of Assistance Independent  Assistive Device None  Distance Ambulated (ft) 250 ft  Activity Response Tolerated well  $Mobility charge 1 Mobility    Pt received in bed agreeable to mobility. No complaints throughout, no physical assistance needed. Left in bed w/ call bell in reach and all needs met.   Paulla Dolly Mobility Specialist

## 2021-12-05 NOTE — Plan of Care (Signed)

## 2021-12-05 NOTE — Progress Notes (Signed)
Discharge instructions reviewed with pt and his significant other. Questions answered.  Copy of instructions given to pt. Informed script sent to his pharmacy for pick up.  Pt d/c'd via wheelchair with belongings, with  significant other.            Escorted by hospital volunteer.

## 2021-12-05 NOTE — Progress Notes (Signed)
Subjective: CC: No abdominal pain. Not requiring prn pain meds. Tolerating diet without n/v. Passing flatus. BM yesterday. Voiding, having pneumaturia. No flecks of stool in urine.   Objective: Vital signs in last 24 hours: Temp:  [98.1 F (36.7 C)-98.5 F (36.9 C)] 98.2 F (36.8 C) (11/07 0735) Pulse Rate:  [70-85] 85 (11/07 0735) Resp:  [15-19] 17 (11/07 0735) BP: (128-142)/(69-86) 133/83 (11/07 0735) SpO2:  [95 %-97 %] 95 % (11/07 0735) Last BM Date : 12/01/21  Intake/Output from previous day: 11/06 0701 - 11/07 0700 In: 842.4 [IV Piggyback:842.4] Out: 1950 [Urine:1950] Intake/Output this shift: No intake/output data recorded.  PE: Gen:  Alert, NAD, pleasant Pulm:  Rate and effort normal Abd: Soft, ND, NT. +BS Psych: A&Ox3   Lab Results:  Recent Labs    12/04/21 0321 12/05/21 0306  WBC 7.7 7.5  HGB 9.6* 9.9*  HCT 29.7* 29.9*  PLT 118* 78*    BMET Recent Labs    12/04/21 0321 12/05/21 0306  NA 132* 137  K 3.2* 3.7  CL 99 102  CO2 24 26  GLUCOSE 96 102*  BUN 6* 8  CREATININE 0.76 0.71  CALCIUM 8.2* 8.5*    PT/INR No results for input(s): "LABPROT", "INR" in the last 72 hours. CMP     Component Value Date/Time   NA 137 12/05/2021 0306   NA 137 06/02/2020 1450   K 3.7 12/05/2021 0306   CL 102 12/05/2021 0306   CO2 26 12/05/2021 0306   GLUCOSE 102 (H) 12/05/2021 0306   BUN 8 12/05/2021 0306   BUN 48 (H) 06/02/2020 1450   CREATININE 0.71 12/05/2021 0306   CREATININE 0.64 (L) 11/21/2015 0915   CALCIUM 8.5 (L) 12/05/2021 0306   PROT 6.0 (L) 12/04/2021 0321   PROT 7.3 06/02/2020 1450   ALBUMIN 1.9 (L) 12/04/2021 0321   ALBUMIN 4.3 06/02/2020 1450   AST 17 12/04/2021 0321   ALT 12 12/04/2021 0321   ALKPHOS 56 12/04/2021 0321   BILITOT 1.2 12/04/2021 0321   BILITOT 0.9 06/02/2020 1450   GFRNONAA >60 12/05/2021 0306   GFRNONAA >89 11/21/2015 0915   GFRAA 136 01/24/2017 1512   GFRAA >89 11/21/2015 0915   Lipase     Component Value  Date/Time   LIPASE 49 07/24/2021 1929    Studies/Results: No results found.  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, patient was admitted in June 2023 and managed conservatively - outpatient colonoscopy 10/17/21 which revealed multiple polyps that were tubular adenomas, a lipoma in the transverse colon, diverticulosis in the sigmoid colon, and congested mucosa in the sigmoid colon; he was advised to follow up in 3 years for repeat colonoscopy - returns 11/2, CT with extensive inflammatory changes involving the sigmoid colon in a background of severe diverticulosis; evolving pericolonic phlegmon without discrete drainable fluid collection; interval development of a colovesicular fistula with perivesicular inflammatory changes; mild left hydronephrosis and left perinephric inflammatory changes  have developed suggesting ascending left-sided infection - I do not think he needs emergency surgery at this time. - Patient appears to be clinically improving. WBC wnl. Pain resolved, tolerating diet, exam reassuring. Okay for d/c on course of oral abx (would recommend Augmentin - 14d course of abx total) and colorectal surgery f/u. Will message [facility]   ID - Zosyn. Afebrile. No tachycardia or hypotension. WBC 7.5 VTE - SCDs, ok for chemical dvt ppx from surgical standpoint FEN - Soft diet. IVF per TR[facility] Foley - none   AKI - resolved Left hydronephrosis Pulmonary centrilobular groundglass infiltrate in the right lung base   Anemia Thrombocytopenia DM HTN Hx gout Tobacco abuse  Prior alcohol abuse  Bilateral inguinal hernias   LOS: 5 days    Jillyn Ledger , [facility] 12/05/2021, 7:58 AM Please see Amion for pager number during day hours 7:00am-4:30pm

## 2021-12-06 ENCOUNTER — Telehealth: Payer: Self-pay

## 2021-12-06 NOTE — Telephone Encounter (Signed)
Transition Care Management Unsuccessful Follow-up Telephone Call  Date of discharge and from where:  [facility] 12/05/2021  Attempts:  1st Attempt  Reason for unsuccessful TCM follow-up call:  Left voice message Juanda Crumble, [place] Direct Dial [phone]

## 2021-12-11 ENCOUNTER — Ambulatory Visit: Payer: Self-pay | Attending: Family Medicine | Admitting: Family Medicine

## 2021-12-11 ENCOUNTER — Encounter: Payer: Self-pay | Admitting: Family Medicine

## 2021-12-11 ENCOUNTER — Other Ambulatory Visit: Payer: Self-pay

## 2021-12-11 VITALS — BP 130/85 | HR 87 | Wt 205.8 lb

## 2021-12-11 DIAGNOSIS — M1A0710 Idiopathic chronic gout, right ankle and foot, without tophus (tophi): Secondary | ICD-10-CM

## 2021-12-11 DIAGNOSIS — E1159 Type 2 diabetes mellitus with other circulatory complications: Secondary | ICD-10-CM

## 2021-12-11 DIAGNOSIS — F17210 Nicotine dependence, cigarettes, uncomplicated: Secondary | ICD-10-CM

## 2021-12-11 DIAGNOSIS — I152 Hypertension secondary to endocrine disorders: Secondary | ICD-10-CM

## 2021-12-11 DIAGNOSIS — E1169 Type 2 diabetes mellitus with other specified complication: Secondary | ICD-10-CM

## 2021-12-11 DIAGNOSIS — Z23 Encounter for immunization: Secondary | ICD-10-CM

## 2021-12-11 DIAGNOSIS — E7849 Other hyperlipidemia: Secondary | ICD-10-CM

## 2021-12-11 DIAGNOSIS — K5720 Diverticulitis of large intestine with perforation and abscess without bleeding: Secondary | ICD-10-CM

## 2021-12-11 LAB — POCT GLYCOSYLATED HEMOGLOBIN (HGB A1C): HbA1c, POC (controlled diabetic range): 5.5 % (ref 0.0–7.0)

## 2021-12-11 LAB — GLUCOSE, POCT (MANUAL RESULT ENTRY): POC Glucose: 98 mg/dl (ref 70–99)

## 2021-12-11 MED ORDER — LOSARTAN POTASSIUM 100 MG PO TABS
100.0000 mg | ORAL_TABLET | Freq: Every day | ORAL | 6 refills | Status: DC
  Filled 2021-12-11 – 2021-12-19 (×2): qty 30, 30d supply, fill #0
  Filled 2022-01-23: qty 30, 30d supply, fill #1
  Filled 2022-03-07: qty 30, 30d supply, fill #2
  Filled 2022-04-09: qty 30, 30d supply, fill #3
  Filled 2022-05-14: qty 30, 30d supply, fill #4
  Filled 2022-06-18: qty 30, 30d supply, fill #5
  Filled 2022-07-26: qty 30, 30d supply, fill #6

## 2021-12-11 MED ORDER — ALLOPURINOL 300 MG PO TABS
300.0000 mg | ORAL_TABLET | Freq: Every day | ORAL | 6 refills | Status: DC
  Filled 2021-12-11 – 2021-12-19 (×3): qty 30, 30d supply, fill #0
  Filled 2022-01-23: qty 30, 30d supply, fill #1
  Filled 2022-03-07: qty 30, 30d supply, fill #2
  Filled 2022-04-09: qty 30, 30d supply, fill #3
  Filled 2022-05-14: qty 30, 30d supply, fill #4
  Filled 2022-06-18: qty 30, 30d supply, fill #5
  Filled 2022-07-25: qty 30, 30d supply, fill #6

## 2021-12-11 MED ORDER — PREDNISONE 20 MG PO TABS
20.0000 mg | ORAL_TABLET | Freq: Every day | ORAL | 0 refills | Status: DC
  Filled 2021-12-11 – 2021-12-19 (×2): qty 5, 5d supply, fill #0

## 2021-12-11 MED ORDER — ATORVASTATIN CALCIUM 40 MG PO TABS
40.0000 mg | ORAL_TABLET | Freq: Every day | ORAL | 6 refills | Status: DC
  Filled 2021-12-11: qty 14, 14d supply, fill #0
  Filled 2021-12-11: qty 16, 16d supply, fill #0
  Filled 2021-12-19: qty 30, 30d supply, fill #0
  Filled 2022-01-23: qty 30, 30d supply, fill #1
  Filled 2022-03-07: qty 30, 30d supply, fill #2
  Filled 2022-04-09: qty 30, 30d supply, fill #3
  Filled 2022-05-14: qty 30, 30d supply, fill #4

## 2021-12-11 NOTE — Patient Instructions (Signed)
Low-Purine Eating Plan A low-purine eating plan involves making food choices to limit your purine intake. Purine is a kind of uric acid. Too much uric acid in your blood can cause certain conditions, such as gout and kidney stones. Eating a low-purine diet may help control these conditions. What are tips for following this plan? Shopping Avoid buying products that contain high-fructose corn syrup. Check for this on food labels. It is commonly found in many processed foods and soft drinks. Be sure to check for it in baked goods such as cookies, canned fruits, and cereals and cereal bars. Avoid buying veal, chicken breast with skin, lamb, and organ meats such as liver. These types of meats tend to have the highest purine content. Choose dairy products. These may lower uric acid levels. Avoid certain types of fish. Not all fish and seafood have high purine content. Examples with high purine content include anchovies, trout, tuna, sardines, and salmon. Avoid buying beverages that contain alcohol, particularly beer and hard liquor. Alcohol can affect the way your body gets rid of uric acid. Meal planning  Learn which foods do or do not affect you. If you find out that a food tends to cause your gout symptoms to flare up, avoid eating that food. You can enjoy foods that do not cause problems. If you have any questions about a food item, talk with your dietitian or health care provider. Reduce the overall amount of meat in your diet. When you do eat meat, choose ones with lower purine content. Include plenty of fruits and vegetables. Although some vegetables may have a high purine content--such as asparagus, mushrooms, spinach, or cauliflower--it has been shown that these do not contribute to uric acid blood levels as much. Consume at least 1 dairy serving a day. This has been shown to decrease uric acid levels. General information If you drink alcohol: Limit how much you have to: 0-1 drink a day for  women who are not pregnant. 0-2 drinks a day for men. Know how much alcohol is in a drink. In the [place] one drink equals one 12 oz bottle of beer (355 mL), one 5 oz glass of wine (148 mL), or one 1 oz glass of hard liquor (44 mL). Drink plenty of water. Try to drink enough to keep your urine pale yellow. Fluids can help remove uric acid from your body. Work with your health care provider and dietitian to develop a plan to achieve or maintain a healthy weight. Losing weight may help reduce uric acid in your blood. What foods are recommended? The following are some types of foods that are good choices when limiting purine intake: Fresh or frozen fruits and vegetables. Whole grains, breads, cereals, and pasta. Rice. Beans, peas, legumes. Nuts and seeds. Dairy products. Fats and oils. The items listed above may not be a complete list. Talk with a dietitian about what dietary choices are best for you. What foods are not recommended? Limit your intake of foods high in purines, including: Beer and other alcohol. Meat-based gravy or sauce. Canned or fresh fish, such as: Anchovies, sardines, herring, salmon, and tuna. Mussels and scallops. Codfish, trout, and haddock. Bacon, veal, chicken breast with skin, and lamb. Organ meats, such as: Liver or kidney. Tripe. Sweetbreads (thymus gland or pancreas). Wild game or goose. Yeast or yeast extract supplements. Drinks sweetened with high-fructose corn syrup, such as soda. Processed foods made with high-fructose corn syrup. The items listed above may not be a complete list of foods   and beverages you should limit. Contact a dietitian for more information. Summary Eating a low-purine diet may help control conditions caused by too much uric acid in the body, such as gout or kidney stones. Choose low-purine foods, limit alcohol, and limit high-fructose corn syrup. You will learn over time which foods do or do not affect you. If you find out that a  food tends to cause your gout symptoms to flare up, avoid eating that food. This information is not intended to replace advice given to you by your health care provider. Make sure you discuss any questions you have with your health care provider. Document Revised: 12/29/2020 Document Reviewed: 12/29/2020 Elsevier Patient Education  2023 Elsevier Inc.  

## 2021-12-11 NOTE — Progress Notes (Signed)
Subjective:  Patient ID: Donald Bender, male    DOB: 1960-03-05  Age: 61 y.o. MRN: [id number]  CC: Hospitalization Follow-up   HPI Donald Bender is a 61 y.o. year old male with a history of  type 2 diabetes mellitus (A1c 6.7), hypertension, polycythemia, tobacco abuse (2-3 packs of cigarettes/day for close to 50 years) here for transition of care visit. He was hospitalized 11/30/2021 through 12/05/2021 for diverticulitis of the colon with perforation. CT scan abdomen and pelvis with contrast revealed:  IMPRESSION: 1. Extensive inflammatory changes involving the sigmoid colon in a background of severe diverticulosis, slightly improved since prior examination and in keeping with subacute perforated diverticulitis. Evolving pericolonic phlegmon without discrete drainable fluid collection identified. 2. Interval development of a colovesicular fistula with perivesicular inflammatory changes. Additionally, mild left hydronephrosis and left perinephric inflammatory changes have developed suggesting ascending left-sided infection. 3. Centrilobular ground-glass pulmonary infiltrate within the visualized right lung base, possibly infectious or inflammatory in the acute setting. 4. Stable mild hepatomegaly. 5. Moderate right coronary artery calcification. 6. Moderate right bowel containing and small left inguinal hernias. 7. Aortic atherosclerosis.    He was treated conservatively with IV antibiotics by general surgery with plans to follow-up outpatient for elective colorectal surgery. He did have magnesium, potassium and sodium derangement which were repleted and AKI with a creatinine of 2.6 on presentation which improved to 0.7 at discharge.  Interval History:  He still feels weak, has slight abdominal pain and some diarrhea and is still on his antibiotics. He sees his Dietitian.  Hie has had Gout in his hands and feet a lot. He does not drink alcohol but ingests red meat.  He has noticed his flares are more frequent and has Colchicine for acute flare.Marland Kitchen He currently has a flare in his right foot  He now smokes 1 pack Cigarettes  in a couple of days down from 2-3 packs/ day (smoked for close to 50 years) Past Medical History:  Diagnosis Date   Diabetes mellitus without complication ([place])    Diverticulitis    Hypertension     Past Surgical History:  Procedure Laterality Date   NO PAST SURGERIES      Family History  Problem Relation Age of Onset   Leukemia Mother 73   Diverticulitis Brother    Colon cancer Neg Hx    Stomach cancer Neg Hx    Esophageal cancer Neg Hx    Colon polyps Neg Hx     Social History   Socioeconomic History   Marital status: Married    Spouse name: Not on file   Number of children: 0   Years of education: Not on file   Highest education level: Not on file  Occupational History   Occupation: Architect  Tobacco Use   Smoking status: Every Day    Packs/day: 1.00    Types: Cigarettes   Smokeless tobacco: Never   Tobacco comments:    Smoking 1 ppd  Vaping Use   Vaping Use: Never used  Substance and Sexual Activity   Alcohol use: Not Currently    Alcohol/week: 6.0 standard drinks of alcohol    Types: 6 Cans of beer per week    Comment: daily drinker   Drug use: No   Sexual activity: Not on file  Other Topics Concern   Not on file  Social History Narrative   Not on file   Social Determinants of Health   Financial Resource Strain: Not on file  Food Insecurity: Not on file  Transportation Needs: Not on file  Physical Activity: Not on file  Stress: Not on file  Social Connections: Not on file    No Known Allergies  Outpatient Medications Prior to Visit  Medication Sig Dispense Refill   amoxicillin-clavulanate (AUGMENTIN) 875-125 MG tablet Take 1 tablet by mouth 2 (two) times daily for 10 days. 20 tablet 0   aspirin EC 81 MG tablet Take 1 tablet (81 mg total) by mouth daily. 30 tablet 5   colchicine 0.6  MG tablet TAKE 2 TABLETS BY MOUTH AT THE ONSET OF A GOUT ATTACK, MAY REPEAT 1 TABLET IN 2 HOURS IF SYMPTOMS PERSIST. (Patient taking differently: Take 1.2 mg by mouth See admin instructions. TAKE 2 TABLETS BY MOUTH AT THE ONSET OF A GOUT ATTACK, MAY REPEAT 1 TABLET IN 2 HOURS IF SYMPTOMS PERSIST.) 90 tablet 0   empagliflozin (JARDIANCE) 25 MG TABS tablet Take 1 tablet (25 mg total) by mouth daily before breakfast. (Patient taking differently: Take 25 mg by mouth at bedtime.) 90 tablet 1   magnesium gluconate (MAGONATE) 500 MG tablet Take 1,500 mg by mouth daily.     metFORMIN (GLUCOPHAGE) 1000 MG tablet Take 1 tablet (1,000 mg total) by mouth 2 (two) times daily with a meal. 180 tablet 3   naproxen sodium (ALEVE) 220 MG tablet Take 220 mg by mouth daily as needed (pain).     atorvastatin (LIPITOR) 40 MG tablet Take 1 tablet (40 mg total) by mouth daily. For cholesterol 30 tablet 6   losartan (COZAAR) 100 MG tablet Take 1 tablet (100 mg total) by mouth daily. 30 tablet 0   Facility-Administered Medications Prior to Visit  Medication Dose Route Frequency Provider Last Rate Last Admin   0.9 %  sodium chloride infusion  500 mL Intravenous Once Cirigliano, Vito V, DO         ROS Review of Systems  Constitutional:  Negative for activity change and appetite change.  HENT:  Negative for sinus pressure and sore throat.   Respiratory:  Negative for chest tightness, shortness of breath and wheezing.   Cardiovascular:  Negative for chest pain and palpitations.  Gastrointestinal:  Positive for abdominal pain and diarrhea. Negative for abdominal distention and constipation.  Genitourinary: Negative.   Musculoskeletal:        See HPI  Psychiatric/Behavioral:  Negative for behavioral problems and dysphoric mood.     Objective:  BP 130/85 (BP Location: Left Arm, Patient Position: Sitting, Cuff Size: Normal)   Pulse 87   Wt 205 lb 12.8 oz (93.4 kg)   SpO2 95%   BMI 25.72 kg/m      12/11/2021     9:17 AM 12/05/2021    7:35 AM 12/05/2021    5:08 AM  BP/Weight  Systolic BP 657 846 962  Diastolic BP 85 83 75  Wt. (Lbs) 205.8    BMI 25.72 kg/m2        Physical Exam Constitutional:      Appearance: He is well-developed.  Cardiovascular:     Rate and Rhythm: Normal rate.     Heart sounds: Normal heart sounds. No murmur heard. Pulmonary:     Effort: Pulmonary effort is normal.     Breath sounds: Normal breath sounds. No wheezing or rales.  Chest:     Chest wall: No tenderness.  Abdominal:     General: Bowel sounds are normal. There is no distension.     Palpations: Abdomen is soft. There is  no mass.     Tenderness: There is no abdominal tenderness.  Musculoskeletal:        General: Normal range of motion.     Right lower leg: No edema.     Left lower leg: No edema.     Comments: Erythema of dorsum of right foot with slight tenderness  Neurological:     Mental Status: He is alert and oriented to person, place, and time.  Psychiatric:        Mood and Affect: Mood normal.        Latest Ref Rng & Units 12/05/2021    3:06 AM 12/04/2021    3:21 AM 12/03/2021    9:28 AM  CMP  Glucose 70 - 99 mg/dL 102  96  110   BUN 8 - 23 mg/dL '8  6  11   '$ Creatinine 0.61 - 1.24 mg/dL 0.71  0.76  0.78   Sodium 135 - 145 mmol/L 137  132  130   Potassium 3.5 - 5.1 mmol/L 3.7  3.2  3.6   Chloride 98 - 111 mmol/L 102  99  96   CO2 22 - 32 mmol/L '26  24  24   '$ Calcium 8.9 - 10.3 mg/dL 8.5  8.2  8.0   Total Protein 6.5 - 8.1 g/dL  6.0    Total Bilirubin 0.3 - 1.2 mg/dL  1.2    Alkaline Phos 38 - 126 U/L  56    AST 15 - 41 U/L  17    ALT 0 - 44 U/L  12      Lipid Panel     Component Value Date/Time   CHOL 186 07/24/2021 1929   CHOL 215 (H) 01/24/2017 1512   TRIG 158 (H) 07/24/2021 1929   HDL 33 (L) 07/24/2021 1929   HDL 53 01/24/2017 1512   CHOLHDL 5.6 07/24/2021 1929   VLDL 32 07/24/2021 1929   LDLCALC 121 (H) 07/24/2021 1929   LDLCALC Comment 01/24/2017 1512    CBC     Component Value Date/Time   WBC 7.5 12/05/2021 0306   RBC 3.49 (L) 12/05/2021 0306   HGB 9.9 (L) 12/05/2021 0306   HCT 29.9 (L) 12/05/2021 0306   PLT 78 (L) 12/05/2021 0306   MCV 85.7 12/05/2021 0306   MCH 28.4 12/05/2021 0306   MCHC 33.1 12/05/2021 0306   RDW 13.8 12/05/2021 0306   LYMPHSABS 1.6 11/30/2021 1858   MONOABS 1.6 (H) 11/30/2021 1858   EOSABS 0.0 11/30/2021 1858   BASOSABS 0.1 11/30/2021 1858    Lab Results  Component Value Date   HGBA1C 5.5 12/11/2021    Assessment & Plan:  1. Type 2 diabetes mellitus with other specified complication, without long-term current use of insulin (HCC) Controlled with A1c of 5.5 Continue metformin Counseled on Diabetic diet, my plate method, 388 minutes of moderate intensity exercise/week Blood sugar logs with fasting goals of 80-120 mg/dl, random of less than 180 and in the event of sugars less than 60 mg/dl or greater than 400 mg/dl encouraged to notify the clinic. Advised on the need for annual eye exams, annual foot exams, Pneumonia vaccine. - POCT glucose (manual entry) - POCT glycosylated hemoglobin (Hb A1C) - Microalbumin/Creatinine Ratio, Urine  2. Other hyperlipidemia Uncontrolled He states he has been out of his statin hence I will hold off on lipid panel till next visit Low-cholesterol diet - atorvastatin (LIPITOR) 40 MG tablet; Take 1 tablet (40 mg total) by mouth daily. For cholesterol  Dispense:  30 tablet; Refill: 6  3. Smoking greater than 20 pack years Spent 3 minutes on smoking cessation but he is not ready to quit at this time - CT CHEST LUNG CANCER SCREENING LOW DOSE WO CONTRAST; Future  4. Diverticulitis of large intestine with perforation and abscess, unspecified bleeding status Currently on antibiotics Plan is for elective procedure He will be following up with general surgery tomorrow  5. Need for immunization against influenza - Flu Vaccine QUAD 54moIM (Fluarix, Fluzone & Alfiuria Quad PF)  6.  Idiopathic chronic gout of right foot without tophus Acute flare Counseled on low purine eating plan We will initiate prednisone which she will take with colchicine for acute flare and once flare has resolved he will begin allopurinol for prophylaxis - allopurinol (ZYLOPRIM) 300 MG tablet; Take 1 tablet (300 mg total) by mouth daily.  Dispense: 30 tablet; Refill: 6 - predniSONE (DELTASONE) 20 MG tablet; Take 1 tablet (20 mg total) by mouth daily with breakfast.  Dispense: 5 tablet; Refill: 0  7. Hypertension associated with diabetes ([place] Controlled Counseled on blood pressure goal of less than 130/80, low-sodium, DASH diet, medication compliance, 150 minutes of moderate intensity exercise per week. Discussed medication compliance, adverse effects. - losartan (COZAAR) 100 MG tablet; Take 1 tablet (100 mg total) by mouth daily.  Dispense: 30 tablet; Refill: 6    Meds ordered this encounter  Medications   allopurinol (ZYLOPRIM) 300 MG tablet    Sig: Take 1 tablet (300 mg total) by mouth daily.    Dispense:  30 tablet    Refill:  6   atorvastatin (LIPITOR) 40 MG tablet    Sig: Take 1 tablet (40 mg total) by mouth daily. For cholesterol    Dispense:  30 tablet    Refill:  6   losartan (COZAAR) 100 MG tablet    Sig: Take 1 tablet (100 mg total) by mouth daily.    Dispense:  30 tablet    Refill:  6   predniSONE (DELTASONE) 20 MG tablet    Sig: Take 1 tablet (20 mg total) by mouth daily with breakfast.    Dispense:  5 tablet    Refill:  0    Follow-up: Return in about 3 months (around 03/13/2022) for Chronic medical conditions.       ECharlott Rakes MD, FAAFP. CSelect [facility] WL[facility]  12/11/2021, 11:24 AM

## 2021-12-12 LAB — MICROALBUMIN / CREATININE URINE RATIO
Creatinine, Urine: 178.2 mg/dL
Microalb/Creat Ratio: 208 mg/g creat — ABNORMAL HIGH (ref 0–29)
Microalbumin, Urine: 371 ug/mL

## 2021-12-15 ENCOUNTER — Other Ambulatory Visit: Payer: Self-pay

## 2021-12-18 ENCOUNTER — Other Ambulatory Visit: Payer: Self-pay

## 2021-12-19 ENCOUNTER — Other Ambulatory Visit: Payer: Self-pay

## 2021-12-20 ENCOUNTER — Other Ambulatory Visit: Payer: Self-pay

## 2022-01-01 ENCOUNTER — Telehealth: Payer: Self-pay | Admitting: Family Medicine

## 2022-01-01 ENCOUNTER — Ambulatory Visit: Payer: Self-pay | Admitting: Surgery

## 2022-01-01 ENCOUNTER — Encounter: Payer: Self-pay | Admitting: Surgery

## 2022-01-01 ENCOUNTER — Other Ambulatory Visit (HOSPITAL_COMMUNITY): Payer: Self-pay

## 2022-01-01 DIAGNOSIS — N433 Hydrocele, unspecified: Secondary | ICD-10-CM | POA: Insufficient documentation

## 2022-01-01 DIAGNOSIS — M6208 Separation of muscle (nontraumatic), other site: Secondary | ICD-10-CM | POA: Diagnosis not present

## 2022-01-01 DIAGNOSIS — M1000 Idiopathic gout, unspecified site: Secondary | ICD-10-CM | POA: Diagnosis not present

## 2022-01-01 DIAGNOSIS — Z720 Tobacco use: Secondary | ICD-10-CM | POA: Diagnosis not present

## 2022-01-01 DIAGNOSIS — K4020 Bilateral inguinal hernia, without obstruction or gangrene, not specified as recurrent: Secondary | ICD-10-CM | POA: Insufficient documentation

## 2022-01-01 DIAGNOSIS — R739 Hyperglycemia, unspecified: Secondary | ICD-10-CM

## 2022-01-01 DIAGNOSIS — K429 Umbilical hernia without obstruction or gangrene: Secondary | ICD-10-CM | POA: Insufficient documentation

## 2022-01-01 DIAGNOSIS — Z92241 Personal history of systemic steroid therapy: Secondary | ICD-10-CM | POA: Diagnosis not present

## 2022-01-01 DIAGNOSIS — N321 Vesicointestinal fistula: Secondary | ICD-10-CM | POA: Diagnosis not present

## 2022-01-01 DIAGNOSIS — K5720 Diverticulitis of large intestine with perforation and abscess without bleeding: Secondary | ICD-10-CM | POA: Diagnosis not present

## 2022-01-01 DIAGNOSIS — D175 Benign lipomatous neoplasm of intra-abdominal organs: Secondary | ICD-10-CM | POA: Insufficient documentation

## 2022-01-01 DIAGNOSIS — J3489 Other specified disorders of nose and nasal sinuses: Secondary | ICD-10-CM | POA: Diagnosis present

## 2022-01-01 DIAGNOSIS — Z86010 Personal history of colonic polyps: Secondary | ICD-10-CM | POA: Diagnosis not present

## 2022-01-01 MED ORDER — POLYETHYLENE GLYCOL 3350 17 GM/SCOOP PO POWD
ORAL | 0 refills | Status: DC
  Filled 2022-01-01: qty 238, 1d supply, fill #0

## 2022-01-01 MED ORDER — METRONIDAZOLE 500 MG PO TABS
ORAL_TABLET | ORAL | 0 refills | Status: DC
  Filled 2022-01-01: qty 6, 1d supply, fill #0

## 2022-01-01 MED ORDER — BISACODYL 5 MG PO TBEC
DELAYED_RELEASE_TABLET | ORAL | 0 refills | Status: DC
  Filled 2022-01-01: qty 4, 1d supply, fill #0

## 2022-01-01 MED ORDER — NEOMYCIN SULFATE 500 MG PO TABS
ORAL_TABLET | ORAL | 0 refills | Status: DC
  Filled 2022-01-01 – 2022-01-02 (×2): qty 6, 1d supply, fill #0

## 2022-01-01 NOTE — Telephone Encounter (Signed)
Pt. Called to review lab work, verbalizes understanding.

## 2022-01-02 ENCOUNTER — Other Ambulatory Visit: Payer: Self-pay

## 2022-01-02 ENCOUNTER — Other Ambulatory Visit (HOSPITAL_COMMUNITY): Payer: Self-pay

## 2022-01-03 NOTE — Progress Notes (Unsigned)
Cardiology Office Note:    Date:  01/04/2022   ID:  Donald Bender, DOB [birth year], MRN [id number]  PCP:  Charlott Rakes, MD   [facility] Providers Cardiologist:  Lenna Sciara, MD Referring MD: Michael Boston, MD   Chief Complaint/Reason for Referral: Preoperative cardiovascular evaluation  ASSESSMENT:    1. Preoperative cardiovascular examination   2. Type 2 diabetes mellitus without complication, without long-term current use of insulin ([place])   3. Hypertension associated with diabetes ([place])   4. Hyperlipidemia associated with type 2 diabetes mellitus ([place])   5. Coronary artery calcification seen on CAT scan   6. Aortic atherosclerosis (HCC)     PLAN:    In order of problems listed above: 1.  Preoperative cardiovascular examination: He can do 4 METS of activity without angina or dyspnea.  He exhibits no signs or symptoms of heart failure, murmurs, significant arrhythmias and denies any presyncope or syncope.  He is at low risk for perioperative cardiovascular complication for noncardiac surgery. 2.  Type 2 diabetes: Continue aspirin, Lipitor, losartan, and Jardiance. 3.  Hypertension: Will start amlodipine 10 mg daily.  The patient had been on this medication previously. 4.  Hyperlipidemia: Goal LDL is less than 70.  This is being followed by patient's primary care provider.  5.  Coronary artery calcification: Continue aspirin, Lipitor and strict blood pressure control. 6.  Aortic atherosclerosis: Continue aspirin, Lipitor, and strict blood pressure control.      Dispo:  Return if symptoms worsen or fail to improve.   Medication Adjustments/Labs and Tests Ordered: Current medicines are reviewed at length with the patient today.  Concerns regarding medicines are outlined above.  The following changes have been made:     Labs/tests ordered: No orders of the defined types were placed in this encounter.   Medication Changes: Meds ordered this encounter  Medications    amLODipine (NORVASC) 10 MG tablet    Sig: Take 1 tablet (10 mg total) by mouth daily.    Dispense:  90 tablet    Refill:  3     Current medicines are reviewed at length with the patient today.  The patient does not have concerns regarding medicines.   History of Present Illness:    FOCUSED PROBLEM LIST:   1.  Type 2 diabetes on metformin 2.  Hyperlipidemia 3.  Hypertension 4.  Aortic atherosclerosis and coronary artery calcification November 2023 CT abdomen pelvis  5.  Tobacco abuse 6.  Diverticulitis requiring surgery; surgeon Dr. Michael Boston  The patient is a 61 y.o. male with the indicated medical history here for recommendations regarding preoperative cardiovascular evaluation prior to noncardiac surgery.  Apparently the patient was admitted to the hospital in November 2023 with diverticulitis of the colon with perforation.  This was treated conservatively.  He was seen by his general surgeon and will require surgery.  The patient is here with his wife.  He denies any cardiovascular complaints.  Prior to being admitted to the hospital in November with his abdominal issues he was able to do all of his activities of daily living including yard work without any angina or dyspnea.  He denies any presyncope, palpitations, frank syncope, signs or symptoms of stroke, or severe bleeding.  He does smoke and has been doing so for a long time.  He has not required any emergency room visits or hospitalizations for cardiac issues.      Current Medications: Current Meds  Medication Sig   allopurinol (ZYLOPRIM) 300 MG tablet  Take 1 tablet (300 mg total) by mouth daily.   amLODipine (NORVASC) 10 MG tablet Take 1 tablet (10 mg total) by mouth daily.   aspirin EC 81 MG tablet Take 1 tablet (81 mg total) by mouth daily.   atorvastatin (LIPITOR) 40 MG tablet Take 1 tablet (40 mg total) by mouth daily. For cholesterol   empagliflozin (JARDIANCE) 25 MG TABS tablet Take 1 tablet (25 mg total) by mouth  daily before breakfast.   losartan (COZAAR) 100 MG tablet Take 1 tablet (100 mg total) by mouth daily.   metFORMIN (GLUCOPHAGE) 1000 MG tablet Take 1 tablet (1,000 mg total) by mouth 2 (two) times daily with a meal.   metroNIDAZOLE (FLAGYL) 500 MG tablet SEE BOWEL PREP INSTRUCTIONS: Take 2 tablets by mouth at 2pm, 3pm, and 10pm the day prior to your colon operation.   naproxen sodium (ALEVE) 220 MG tablet Take 220 mg by mouth daily as needed (pain).   neomycin (MYCIFRADIN) 500 MG tablet SEE BOWEL PREP INSTRUCTIONS: Take 2 tablets by mouth at 2pm, 3pm, and 10pm the day prior to your colon operation.   polyethylene glycol powder (GLYCOLAX/MIRALAX) 17 GM/SCOOP powder Take 238 g by mouth once for 1 dose. Take according to your procedure prep instructions.   Current Facility-Administered Medications for the 01/04/22 encounter (Office Visit) with Early Osmond, MD  Medication   0.9 %  sodium chloride infusion     Allergies:    Patient has no known allergies.   Social History:   Social History   Tobacco Use   Smoking status: Every Day    Packs/day: 1.00    Types: Cigarettes   Smokeless tobacco: Never   Tobacco comments:    Smoking 1 ppd  Vaping Use   Vaping Use: Never used  Substance Use Topics   Alcohol use: Not Currently    Alcohol/week: 6.0 standard drinks of alcohol    Types: 6 Cans of beer per week    Comment: daily drinker   Drug use: No     Family Hx: Family History  Problem Relation Age of Onset   Leukemia Mother 73   Diverticulitis Brother    Colon cancer Neg Hx    Stomach cancer Neg Hx    Esophageal cancer Neg Hx    Colon polyps Neg Hx      Review of Systems:   Please see the history of present illness.    All other systems reviewed and are negative.     EKGs/Labs/Other Test Reviewed:    EKG:  EKG performed November 2023 that I personally reviewed demonstrates sinus tachycardia.  Prior CV studies: None available  Other studies Reviewed: Review of the  additional studies/records demonstrates: CT abdomen pelvis demonstrates coronary artery calcification and aortic atherosclerosis November 2023  Recent Labs: 07/25/2021: TSH 3.937 12/04/2021: ALT 12 12/05/2021: BUN 8; Creatinine, Ser 0.71; Hemoglobin 9.9; Magnesium 1.6; Platelets 78; Potassium 3.7; Sodium 137   Recent Lipid Panel Lab Results  Component Value Date/Time   CHOL 186 07/24/2021 07:29 PM   CHOL 215 (H) 01/24/2017 03:12 PM   TRIG 158 (H) 07/24/2021 07:29 PM   HDL 33 (L) 07/24/2021 07:29 PM   HDL 53 01/24/2017 03:12 PM   LDLCALC 121 (H) 07/24/2021 07:29 PM   LDLCALC Comment 01/24/2017 03:12 PM    Risk Assessment/Calculations:           HYPERTENSION CONTROL Vitals:   01/04/22 1436 01/04/22 1453  BP: (!) 140/72 (!) 140/90    The patient's  blood pressure is elevated above target today.  In order to address the patient's elevated BP: A new medication was prescribed today.       Physical Exam:    VS:  BP (!) 140/90   Pulse 96   Ht '6\' 3"'$  (1.905 m)   Wt 204 lb (92.5 kg)   SpO2 96%   BMI 25.50 kg/m    Wt Readings from Last 3 Encounters:  01/04/22 204 lb (92.5 kg)  12/11/21 205 lb 12.8 oz (93.4 kg)  12/02/21 214 lb 15.2 oz (97.5 kg)    GENERAL:  No apparent distress, AOx3 HEENT:  No carotid bruits, +2 carotid impulses, no scleral icterus CAR: RRR no murmurs, gallops, rubs, or thrills RES:  Clear to auscultation bilaterally ABD:  Soft, nontender, nondistended, positive bowel sounds x 4 VASC:  +2 radial pulses, +2 carotid pulses, palpable pedal pulses NEURO:  CN 2-12 grossly intact; motor and sensory grossly intact PSYCH:  No active depression or anxiety EXT:  No edema, ecchymosis, or cyanosis  Signed, Early Osmond, MD  01/04/2022 2:56 PM    [address], [place], [place]  [address] Phone: [phone]; Fax: [phone]   Note:  This document was prepared using Dragon voice recognition software and may include  unintentional dictation errors.

## 2022-01-04 ENCOUNTER — Ambulatory Visit: Payer: Medicaid Other | Attending: Internal Medicine | Admitting: Internal Medicine

## 2022-01-04 ENCOUNTER — Telehealth: Payer: Self-pay

## 2022-01-04 ENCOUNTER — Other Ambulatory Visit: Payer: Self-pay

## 2022-01-04 ENCOUNTER — Other Ambulatory Visit (HOSPITAL_COMMUNITY): Payer: Self-pay

## 2022-01-04 ENCOUNTER — Encounter: Payer: Self-pay | Admitting: Internal Medicine

## 2022-01-04 VITALS — BP 140/90 | HR 96 | Ht 75.0 in | Wt 204.0 lb

## 2022-01-04 DIAGNOSIS — E1169 Type 2 diabetes mellitus with other specified complication: Secondary | ICD-10-CM

## 2022-01-04 DIAGNOSIS — I152 Hypertension secondary to endocrine disorders: Secondary | ICD-10-CM

## 2022-01-04 DIAGNOSIS — E785 Hyperlipidemia, unspecified: Secondary | ICD-10-CM

## 2022-01-04 DIAGNOSIS — I2510 Atherosclerotic heart disease of native coronary artery without angina pectoris: Secondary | ICD-10-CM | POA: Diagnosis not present

## 2022-01-04 DIAGNOSIS — E119 Type 2 diabetes mellitus without complications: Secondary | ICD-10-CM | POA: Diagnosis not present

## 2022-01-04 DIAGNOSIS — E1159 Type 2 diabetes mellitus with other circulatory complications: Secondary | ICD-10-CM | POA: Diagnosis not present

## 2022-01-04 DIAGNOSIS — I700 Atherosclerosis of aorta: Secondary | ICD-10-CM

## 2022-01-04 DIAGNOSIS — Z01810 Encounter for preprocedural cardiovascular examination: Secondary | ICD-10-CM

## 2022-01-04 MED ORDER — AMLODIPINE BESYLATE 10 MG PO TABS
10.0000 mg | ORAL_TABLET | Freq: Every day | ORAL | 3 refills | Status: DC
  Filled 2022-01-04: qty 90, 90d supply, fill #0
  Filled 2022-04-09: qty 90, 90d supply, fill #1
  Filled 2022-08-10: qty 90, 90d supply, fill #2
  Filled 2022-12-05: qty 90, 90d supply, fill #3

## 2022-01-04 NOTE — Patient Instructions (Signed)
Medication Instructions:  Your physician has recommended you make the following change in your medication:  1.) start amlodipine (Norvasc) 10 mg - take one tablet daily for blood pressure  *If you need a refill on your cardiac medications before your next appointment, please call your pharmacy*   Lab Work: none   Testing/Procedures: none   Follow-Up:  As needed with Dr. Ali Lowe   Important Information About Sugar

## 2022-01-04 NOTE — Telephone Encounter (Signed)
   Name: Donald Bender  DOB: [birth year]  MRN: [id number]  Primary Cardiologist: None  Chart reviewed as part of pre-operative protocol coverage. The patient has an upcoming visit scheduled with Dr. Ali Lowe on 01/04/2022 at which time clearance can be addressed in case there are any issues that would impact surgical recommendations.  Robotic colon resection is not scheduled until TBD as below. I added preop FYI to appointment note so that provider is aware to address at time of outpatient visit.  Per office protocol the cardiology provider should forward their finalized clearance decision and recommendations regarding antiplatelet therapy to the requesting party below.    I will route this message as FYI to requesting party and remove this message from the preop box as separate preop APP input not needed at this time.   Please call with any questions.  Lenna Sciara, NP  01/04/2022, 11:50 AM

## 2022-01-04 NOTE — Telephone Encounter (Signed)
..     Pre-operative Risk Assessment    Patient Name: Donald Bender  DOB: 1960-05-28 MRN: [id number]      Request for Surgical Clearance    Procedure:   ROBOTIC COLON RESECTION  Date of Surgery:  Clearance TBD                                 Surgeon:  DR Michael Boston Surgeon's Group or Practice Name:  [facility] Phone number:  [phone] Fax number:  [phone]   Type of Clearance Requested:   [name]   Type of Anesthesia:  General    Additional requests/questions:    Gwenlyn Found   01/04/2022, 11:39 AM

## 2022-01-09 ENCOUNTER — Other Ambulatory Visit (HOSPITAL_COMMUNITY): Payer: Self-pay

## 2022-01-09 MED ORDER — METRONIDAZOLE 500 MG PO TABS
1000.0000 mg | ORAL_TABLET | Freq: Three times a day (TID) | ORAL | 0 refills | Status: DC
  Filled 2022-01-09: qty 6, 1d supply, fill #0

## 2022-01-09 MED ORDER — NEOMYCIN SULFATE 500 MG PO TABS
1000.0000 mg | ORAL_TABLET | Freq: Three times a day (TID) | ORAL | 0 refills | Status: DC
  Filled 2022-01-09: qty 6, 1d supply, fill #0

## 2022-01-18 ENCOUNTER — Other Ambulatory Visit: Payer: Self-pay | Admitting: Urology

## 2022-01-24 ENCOUNTER — Other Ambulatory Visit: Payer: Self-pay

## 2022-02-09 ENCOUNTER — Other Ambulatory Visit (HOSPITAL_COMMUNITY): Payer: Self-pay

## 2022-02-13 NOTE — Patient Instructions (Addendum)
SURGICAL WAITING ROOM VISITATION Patients having surgery or a procedure may have no more than 2 support people in the waiting area - these visitors may rotate in the visitor waiting room.   Due to an increase in RSV and influenza rates and associated hospitalizations, children ages 83 and under may not visit patients in [place]. If the patient needs to stay at the hospital during part of their recovery, the visitor guidelines for inpatient rooms apply.  PRE-OP VISITATION  Pre-op nurse will coordinate an appropriate time for 1 support person to accompany the patient in pre-op.  This support person may not rotate.  This visitor will be contacted when the time is appropriate for the visitor to come back in the pre-op area.  Please refer to the [facility] website for the visitor guidelines for Inpatients (after your surgery is over and you are in a regular room).  You are not required to quarantine at this time prior to your surgery. However, you must do this: Hand Hygiene often Do NOT share personal items Notify your provider if you are in close contact with someone who has COVID or you develop fever 100.4 or greater, new onset of sneezing, cough, sore throat, shortness of breath or body aches.  If you test positive for Covid or have been in contact with anyone that has tested positive in the last 10 days please notify you surgeon.    Your procedure is scheduled on:  Thursday   February 22, 2022  Report to [facility] Main Entrance: [place] entrance where the [employer] is available.   Report to admitting at:05:15   AM  +++++Call this number if you have any questions or problems the morning of surgery [phone]  Do not eat food after Midnight the night prior to your surgery/procedure.  After Midnight you may have the following liquids until  04:30  AM DAY OF SURGERY  Clear Liquid Diet Water Black Coffee (sugar ok, NO MILK/CREAM OR CREAMERS)  Tea (sugar ok,  NO MILK/CREAM OR CREAMERS) regular and decaf                             Plain Jell-O  with no fruit (NO RED)                                           Fruit ices (not with fruit pulp, NO RED)                                     Popsicles (NO RED)                                                                  Juice: apple, WHITE grape, WHITE cranberry Sports drinks like Gatorade or Powerade (NO RED)              DRINK two (2) bottles of Pre-Surgery G2 starting at 6:00 pm the evening prior to your surgery to help prevent dehydration. Increase drinking clear fluids (see list above)  The day of surgery:  Drink ONE (1) Pre-Surgery G2 at   04:30  AM the morning of surgery. Drink in one sitting. Do not sip.  This drink was given to you during your hospital pre-op appointment visit. Nothing else to drink after completing the Pre-Surgery G2 : No candy, chewing gum or throat lozenges.    FOLLOW BOWEL PREP AND ANY ADDITIONAL PRE OP INSTRUCTIONS YOU RECEIVED FROM YOUR SURGEON'S OFFICE!!! Dulcolax 20 mg - Take 4 of the 5 mg tablets (equals '20mg'$ ) with water at 07:00 am the day prior to surgery.  Miralax 255 g - Mix with 64 oz Gatorade/Powerade.  Starting at 10:00 am ,Drink this gradually over the next few hours (8 oz glass every 15-30 minutes) until gone the day prior to surgery You should finish in 4 hours-6 hours.    Neomycin 1000 mg - At 2 pm, 3 pm and 10 pm after Miralax  bowel prep the day prior to surgery.  Metronidazole 1000 mg - At 2 pm, 3 pm and 10 pm after Miralax bowel prep the day prior to surgery.   Drink plenty of clear liquids all evening to avoid getting dehydrated.   Oral Hygiene is also important to reduce your risk of infection.        Remember - BRUSH YOUR TEETH THE MORNING OF SURGERY WITH YOUR REGULAR TOOTHPASTE  Do NOT smoke after Midnight the night before surgery.  Take ONLY these medicines the morning of surgery with A SIP OF WATER: Amlodipine,  Allopurinol  Diabetic Medication:  Jardiance - Do NOT take this medication on the Day Before surgery (02-21-22) or the [place] 02-22-22) Metformin - Take your USUAL dose of this medication the Day Before Surgery (02-21-22). DO NOT TAKE this medication on the DAY OF SURGERY (02-22-22)                    You may not have any metal on your body including jewelry, and body piercing  Do not wear lotions, powders, cologne, or deodorant  Men may shave face and neck.  Contacts, Hearing Aids, dentures or bridgework may not be worn into surgery. DENTURES WILL BE REMOVED PRIOR TO SURGERY PLEASE DO NOT APPLY "Poly grip" OR ADHESIVES!!!  You may bring a small overnight bag with you on the day of surgery, only pack items that are not valuable. [place] [facility] IS NOT RESPONSIBLE   FOR VALUABLES THAT ARE LOST OR STOLEN.   Do not bring your home medications to the hospital. The Pharmacy will dispense medications listed on your medication list to you during your admission in the Hospital.  Special Instructions: Bring a copy of your healthcare power of attorney and living will documents the day of surgery, if you wish to have them scanned into your [place] Medical Records- EPIC  Please read over the following fact sheets you were given: IF YOU HAVE QUESTIONS ABOUT YOUR PRE-OP INSTRUCTIONS, PLEASE CALL [phone]  ([place])   [place] - Preparing for Surgery Before surgery, you can play an important role.  Because skin is not sterile, your skin needs to be as free of germs as possible.  You can reduce the number of germs on your skin by washing with CHG (chlorahexidine gluconate) soap before surgery.  CHG is an antiseptic cleaner which kills germs and bonds with the skin to continue killing germs even after washing. Please DO NOT use if you have an allergy to CHG or antibacterial soaps.  If your skin  becomes reddened/irritated stop using the CHG and inform your nurse when you arrive at Short Stay. Do  not shave (including legs and underarms) for at least 48 hours prior to the first CHG shower.  You may shave your face/neck.  Please follow these instructions carefully:  1.  Shower with CHG Soap the night before surgery and the  morning of surgery.  2.  If you choose to wash your hair, wash your hair first as usual with your normal  shampoo.  3.  After you shampoo, rinse your hair and body thoroughly to remove the shampoo.                             4.  Use CHG as you would any other liquid soap.  You can apply chg directly to the skin and wash.  Gently with a scrungie or clean washcloth.  5.  Apply the CHG Soap to your body ONLY FROM THE NECK DOWN.   Do not use on face/ open                           Wound or open sores. Avoid contact with eyes, ears mouth and genitals (private parts).                       Wash face,  Genitals (private parts) with your normal soap.             6.  Wash thoroughly, paying special attention to the area where your  surgery  will be performed.  7.  Thoroughly rinse your body with warm water from the neck down.  8.  DO NOT shower/wash with your normal soap after using and rinsing off the CHG Soap.            9.  Pat yourself dry with a clean towel.            10.  Wear clean pajamas.            11.  Place clean sheets on your bed the night of your first shower and do not  sleep with pets.  ON THE DAY OF SURGERY : Do not apply any lotions/deodorants the morning of surgery.  Please wear clean clothes to the hospital/surgery center.    FAILURE TO FOLLOW THESE INSTRUCTIONS MAY RESULT IN THE CANCELLATION OF YOUR SURGERY  PATIENT SIGNATURE_________________________________  NURSE SIGNATURE__________________________________  ________________________________________________________________________          Adam Phenix    An incentive spirometer is a tool that can help keep your lungs clear and active. This tool measures how well you are  filling your lungs with each breath. Taking long deep breaths may help reverse or decrease the chance of developing breathing (pulmonary) problems (especially infection) following: A long period of time when you are unable to move or be active. BEFORE THE PROCEDURE  If the spirometer includes an indicator to show your best effort, your nurse or respiratory therapist will set it to a desired goal. If possible, sit up straight or lean slightly forward. Try not to slouch. Hold the incentive spirometer in an upright position. INSTRUCTIONS FOR USE  Sit on the edge of your bed if possible, or sit up as far as you can in bed or on a chair. Hold the incentive spirometer in an upright position. Breathe out normally. Place the mouthpiece in your  mouth and seal your lips tightly around it. Breathe in slowly and as deeply as possible, raising the piston or the ball toward the top of the column. Hold your breath for 3-5 seconds or for as long as possible. Allow the piston or ball to fall to the bottom of the column. Remove the mouthpiece from your mouth and breathe out normally. Rest for a few seconds and repeat Steps 1 through 7 at least 10 times every 1-2 hours when you are awake. Take your time and take a few normal breaths between deep breaths. The spirometer may include an indicator to show your best effort. Use the indicator as a goal to work toward during each repetition. After each set of 10 deep breaths, practice coughing to be sure your lungs are clear. If you have an incision (the cut made at the time of surgery), support your incision when coughing by placing a pillow or rolled up towels firmly against it. Once you are able to get out of bed, walk around indoors and cough well. You may stop using the incentive spirometer when instructed by your caregiver.  RISKS AND COMPLICATIONS Take your time so you do not get dizzy or light-headed. If you are in pain, you may need to take or ask for pain  medication before doing incentive spirometry. It is harder to take a deep breath if you are having pain. AFTER USE Rest and breathe slowly and easily. It can be helpful to keep track of a log of your progress. Your caregiver can provide you with a simple table to help with this. If you are using the spirometer at home, follow these instructions: [place] IF:  You are having difficultly using the spirometer. You have trouble using the spirometer as often as instructed. Your pain medication is not giving enough relief while using the spirometer. You develop fever of 100.5 F (38.1 C) or higher.                                                                                                    SEEK IMMEDIATE MEDICAL CARE IF:  You cough up bloody sputum that had not been present before. You develop fever of 102 F (38.9 C) or greater. You develop worsening pain at or near the incision site. MAKE SURE YOU:  Understand these instructions. Will watch your condition. Will get help right away if you are not doing well or get worse. Document Released: 05/28/2006 Document Revised: 04/09/2011 Document Reviewed: 07/29/2006 [facility] Patient Information 2014 [place], [place].

## 2022-02-13 NOTE — Progress Notes (Addendum)
COVID Vaccine received:  _0  No _1  Yes Date of any COVID positive Test in last 90 days:  None  PCP - Charlott Rakes, MD at [facility]  Cardiologist - Lenna Sciara, MD Cardiac clearance 01-04-22  Epic note GI- Gerrit Heck, DO  Chest x-ray - 11-30-21 Epic EKG - 11-30-21  Epic  Stress Test -  ECHO -  Cardiac Cath -   PCR screen: _2  Ordered & Completed                      _3   No Order but Needs PROFEND                      _4   N/A for this surgery  Surgery Plan:  _5  Ambulatory                            _6  Outpatient in bed                            _7  Admit  Anesthesia:    _8  General  _9  Spinal                           _10   Choice _11   MAC  Bowel Prep - _12  No  _13   Yes _CCS Bowel prep Miralax, dulcolax, neomycin etc.  Patient is aware. Donald Bender was given three G2 (Pt is diabetic) drinks with the instructions of when to drink them.   Pacemaker / ICD device _14  No _15  Yes        Device order form faxed _16  No    _17   Yes      Faxed to:  Spinal Cord Stimulator:_18  No _19  Yes      (Remind patient to bring remote DOS) Other Implants:   History of Sleep Apnea? _20  No _21  Yes   CPAP used?- _22  No _23  Yes    DM2  Does the patient monitor blood sugar? _24  No _25  Yes  _26  N/A Does patient have a [employer] or Dexacom? _27  No _28  Yes   Fasting Blood Sugar Ranges-  Checks Blood Sugar __2_times a month  Last dose of GLP1 agonist- none GLP1 instructions:  Last dose of SGLT-2 inhibitors-  Jardiance 25 mg q am.  SGLT-2 instructions: Do not take either the day before surgery or the DOS.   Metformin 1024m bid- You may take the day before surgery as usual but do not take the DOS,  Blood Thinner / Instructions: none Aspirin Instructions:ASA 81 mg  stopping tomorrow 02-15-22  ERAS Protocol Ordered: _29  No  _30  Yes PRE-SURGERY _31  ENSURE  _32  G2 X3 Patient is to be NPO after: 04:30 am  Comments: The Ostomy nurse, KEstrellita Ludwigsaw the patient for consult  and markings at the PST appt. All questions were asked and answered. Patient voiced understanding of the need to mark his abdomen.   Activity level: Patient can climb a flight of stairs without difficulty; _33  No CP  _34  No SOB. Patient can perform ADLs without assistance.   Anesthesia review: DM2, HTN, Coronary calcification on CT scan, smokes, Polycythemia, Mild hepatomegaly, ETOH Abuse.   Patient denies shortness of breath, fever, cough and chest pain at PAT appointment.  Patient verbalized understanding and agreement to the Pre-Surgical Instructions that were given to them at this  PAT appointment. Patient was also educated of the need to review these PAT instructions again prior to his/her surgery.I reviewed the appropriate phone numbers to call if they have any and questions or concerns.

## 2022-02-14 ENCOUNTER — Encounter (HOSPITAL_COMMUNITY)
Admission: RE | Admit: 2022-02-14 | Discharge: 2022-02-14 | Disposition: A | Discharge status: Home / Self Care | Payer: Medicaid Other | Source: Ambulatory Visit | Attending: Surgery | Admitting: Surgery

## 2022-02-14 ENCOUNTER — Encounter (HOSPITAL_COMMUNITY): Payer: Self-pay

## 2022-02-14 ENCOUNTER — Other Ambulatory Visit: Payer: Self-pay

## 2022-02-14 VITALS — BP 148/88 | HR 88 | Temp 97.7°F | Resp 20 | Ht 75.0 in | Wt 203.0 lb

## 2022-02-14 DIAGNOSIS — I10 Essential (primary) hypertension: Secondary | ICD-10-CM | POA: Insufficient documentation

## 2022-02-14 DIAGNOSIS — K429 Umbilical hernia without obstruction or gangrene: Secondary | ICD-10-CM | POA: Insufficient documentation

## 2022-02-14 DIAGNOSIS — E119 Type 2 diabetes mellitus without complications: Secondary | ICD-10-CM

## 2022-02-14 DIAGNOSIS — E1165 Type 2 diabetes mellitus with hyperglycemia: Secondary | ICD-10-CM | POA: Diagnosis not present

## 2022-02-14 DIAGNOSIS — R739 Hyperglycemia, unspecified: Secondary | ICD-10-CM

## 2022-02-14 DIAGNOSIS — Z01812 Encounter for preprocedural laboratory examination: Secondary | ICD-10-CM | POA: Insufficient documentation

## 2022-02-14 DIAGNOSIS — M6208 Separation of muscle (nontraumatic), other site: Secondary | ICD-10-CM | POA: Insufficient documentation

## 2022-02-14 DIAGNOSIS — Z789 Other specified health status: Secondary | ICD-10-CM

## 2022-02-14 HISTORY — DX: Unspecified osteoarthritis, unspecified site: M19.90

## 2022-02-14 LAB — CBC
HCT: 44.7 % (ref 39.0–52.0)
Hemoglobin: 13.8 g/dL (ref 13.0–17.0)
MCH: 28.2 pg (ref 26.0–34.0)
MCHC: 30.9 g/dL (ref 30.0–36.0)
MCV: 91.2 fL (ref 80.0–100.0)
Platelets: 133 10*3/uL — ABNORMAL LOW (ref 150–400)
RBC: 4.9 MIL/uL (ref 4.22–5.81)
RDW: 16.8 % — ABNORMAL HIGH (ref 11.5–15.5)
WBC: 10.7 10*3/uL — ABNORMAL HIGH (ref 4.0–10.5)
nRBC: 0 % (ref 0.0–0.2)

## 2022-02-14 LAB — COMPREHENSIVE METABOLIC PANEL
ALT: 11 U/L (ref 0–44)
AST: 11 U/L — ABNORMAL LOW (ref 15–41)
Albumin: 3.8 g/dL (ref 3.5–5.0)
Alkaline Phosphatase: 51 U/L (ref 38–126)
Anion gap: 8 (ref 5–15)
BUN: 13 mg/dL (ref 8–23)
CO2: 26 mmol/L (ref 22–32)
Calcium: 9.4 mg/dL (ref 8.9–10.3)
Chloride: 103 mmol/L (ref 98–111)
Creatinine, Ser: 0.66 mg/dL (ref 0.61–1.24)
GFR, Estimated: >60 mL/min (ref >60)
Glucose, Bld: 101 mg/dL — ABNORMAL HIGH (ref 70–99)
Potassium: 4.3 mmol/L (ref 3.5–5.1)
Sodium: 137 mmol/L (ref 135–145)
Total Bilirubin: 0.2 mg/dL — ABNORMAL LOW (ref 0.3–1.2)
Total Protein: 8 g/dL (ref 6.5–8.1)

## 2022-02-14 LAB — HEMOGLOBIN A1C
Hgb A1c MFr Bld: 5.6 % (ref 4.8–5.6)
Mean Plasma Glucose: 114.02 mg/dL

## 2022-02-14 LAB — GLUCOSE, CAPILLARY: Glucose-Capillary: 96 mg/dL (ref 70–99)

## 2022-02-14 NOTE — Consult Note (Signed)
[facility] Nurse requested for preoperative stoma site marking  Discussed surgical procedure and stoma creation with patient and family.  Explained role of the [facility] nurse team.  Provided the patient with educational booklet and provided samples of pouching options.  Answered patient and family questions. Patient and wife not clear on ostomy.  I discussed that Dr Johney [place] has indicated that an ostomy was possible and the purpose of the marking is to place the stoma in the best possible place.    Examined patient sitting, and standing in order to place the marking in the patient's visual field, away from any creases or abdominal contour issues and within the rectus muscle.  Diastasis Recti noted.   Noted umbilical hernia with protrusion prominent in RMQ obscuring view of lower quadrant.  Marked for colostomy in the LLQ  4 cm to the left of the umbilicus and 2 cm above the umbilicus.  Marked for ileostomy in the RLQ 4 cm to the right of the umbilicus and  4 cm above the umbilicus.  Patient's abdomen cleansed with CHG wipes at site markings, allowed to air dry prior to marking.Covered mark with thin film transparent dressing to preserve mark until date of surgery.   [facility] Nurse team will follow up with patient after surgery for continue ostomy care and teaching.  Donald Ludwig MSN, RN, FNP-BC CWON Wound, Ostomy, Continence Nurse [facility] [phone] Pager [phone]

## 2022-02-20 ENCOUNTER — Other Ambulatory Visit: Payer: Self-pay

## 2022-02-21 NOTE — H&P (Signed)
H&P  Chief Complaint: Colovesical fistula  History of Present Illness: 62 yo male presents for surgical mgmt of a colovesical fistula by Dr Clyda Greener. GU involvement needed for placement of firefly in (B) ureters to assist with robotic surgery.  Past Medical History:  Diagnosis Date   Arthritis    osteoarthritis and gout   Diabetes mellitus without complication ([place])    Diverticulitis    Hypertension     Past Surgical History:  Procedure Laterality Date   COLONOSCOPY     HYDROCELE EXCISION / REPAIR     NO PAST SURGERIES      Home Medications:  Allergies as of 02/21/2022   No Known Allergies      Medication List      Notice   Cannot display discharge medications because the patient has not yet been admitted.     Allergies: No Known Allergies  Family History  Problem Relation Age of Onset   Leukemia Mother 23   Diverticulitis Brother    Colon cancer Neg Hx    Stomach cancer Neg Hx    Esophageal cancer Neg Hx    Colon polyps Neg Hx     Social History:  reports that he has been smoking cigarettes. He has been smoking an average of 1 pack per day. He has never used smokeless tobacco. He reports that he does not currently use alcohol after a past usage of about 6.0 standard drinks of alcohol per week. He reports that he does not use drugs.  ROS: A complete review of systems was performed.  All systems are negative except for pertinent findings as noted.  Physical Exam:  Vital signs in last 24 hours: There were no vitals taken for this visit. Constitutional:  Alert and oriented, No acute distress Cardiovascular: Regular rate  Psychiatric: Normal mood and affect    I have reviewed notes from referring/previous physicians  I have reviewed urinalysis results  I have independently reviewed prior imaging    Impression/Assessment:  Colovesical fistula  Plan:  Cysto, retrograde injection of firefly bilaterally in ureters

## 2022-02-22 ENCOUNTER — Encounter (HOSPITAL_COMMUNITY)
Admission: RE | Disposition: A | Discharge status: Home / Self Care | Payer: Self-pay | Source: Ambulatory Visit | Attending: Surgery

## 2022-02-22 ENCOUNTER — Encounter (HOSPITAL_COMMUNITY): Payer: Self-pay | Admitting: Surgery

## 2022-02-22 ENCOUNTER — Other Ambulatory Visit: Payer: Self-pay

## 2022-02-22 ENCOUNTER — Inpatient Hospital Stay (HOSPITAL_COMMUNITY): Payer: Medicaid Other | Admitting: Certified Registered Nurse Anesthetist

## 2022-02-22 ENCOUNTER — Inpatient Hospital Stay (HOSPITAL_COMMUNITY)
Admission: RE | Admit: 2022-02-22 | Discharge: 2022-02-24 | DRG: 654 | Disposition: A | Discharge status: Home / Self Care | Payer: Medicaid Other | Source: Ambulatory Visit | Attending: Surgery | Admitting: Surgery

## 2022-02-22 DIAGNOSIS — K651 Peritoneal abscess: Secondary | ICD-10-CM | POA: Diagnosis not present

## 2022-02-22 DIAGNOSIS — K630 Abscess of intestine: Secondary | ICD-10-CM

## 2022-02-22 DIAGNOSIS — I10 Essential (primary) hypertension: Secondary | ICD-10-CM | POA: Diagnosis present

## 2022-02-22 DIAGNOSIS — Z92241 Personal history of systemic steroid therapy: Secondary | ICD-10-CM

## 2022-02-22 DIAGNOSIS — M6208 Separation of muscle (nontraumatic), other site: Secondary | ICD-10-CM | POA: Diagnosis not present

## 2022-02-22 DIAGNOSIS — L711 Rhinophyma: Secondary | ICD-10-CM | POA: Diagnosis not present

## 2022-02-22 DIAGNOSIS — Z79899 Other long term (current) drug therapy: Secondary | ICD-10-CM | POA: Diagnosis not present

## 2022-02-22 DIAGNOSIS — K5720 Diverticulitis of large intestine with perforation and abscess without bleeding: Secondary | ICD-10-CM | POA: Diagnosis present

## 2022-02-22 DIAGNOSIS — Z806 Family history of leukemia: Secondary | ICD-10-CM

## 2022-02-22 DIAGNOSIS — B079 Viral wart, unspecified: Secondary | ICD-10-CM | POA: Diagnosis present

## 2022-02-22 DIAGNOSIS — E781 Pure hyperglyceridemia: Secondary | ICD-10-CM | POA: Diagnosis present

## 2022-02-22 DIAGNOSIS — M1990 Unspecified osteoarthritis, unspecified site: Secondary | ICD-10-CM | POA: Diagnosis present

## 2022-02-22 DIAGNOSIS — Z720 Tobacco use: Secondary | ICD-10-CM | POA: Diagnosis present

## 2022-02-22 DIAGNOSIS — K4020 Bilateral inguinal hernia, without obstruction or gangrene, not specified as recurrent: Secondary | ICD-10-CM | POA: Diagnosis not present

## 2022-02-22 DIAGNOSIS — E119 Type 2 diabetes mellitus without complications: Secondary | ICD-10-CM | POA: Diagnosis not present

## 2022-02-22 DIAGNOSIS — Z86010 Personal history of colonic polyps: Secondary | ICD-10-CM

## 2022-02-22 DIAGNOSIS — M1000 Idiopathic gout, unspecified site: Secondary | ICD-10-CM | POA: Diagnosis not present

## 2022-02-22 DIAGNOSIS — N321 Vesicointestinal fistula: Secondary | ICD-10-CM

## 2022-02-22 DIAGNOSIS — D751 Secondary polycythemia: Secondary | ICD-10-CM | POA: Diagnosis not present

## 2022-02-22 DIAGNOSIS — K429 Umbilical hernia without obstruction or gangrene: Secondary | ICD-10-CM | POA: Diagnosis present

## 2022-02-22 DIAGNOSIS — Z7984 Long term (current) use of oral hypoglycemic drugs: Secondary | ICD-10-CM | POA: Diagnosis not present

## 2022-02-22 DIAGNOSIS — F17210 Nicotine dependence, cigarettes, uncomplicated: Secondary | ICD-10-CM | POA: Diagnosis present

## 2022-02-22 DIAGNOSIS — J3489 Other specified disorders of nose and nasal sinuses: Secondary | ICD-10-CM | POA: Diagnosis present

## 2022-02-22 DIAGNOSIS — K658 Other peritonitis: Secondary | ICD-10-CM | POA: Diagnosis not present

## 2022-02-22 DIAGNOSIS — Z716 Tobacco abuse counseling: Secondary | ICD-10-CM | POA: Diagnosis not present

## 2022-02-22 DIAGNOSIS — K388 Other specified diseases of appendix: Secondary | ICD-10-CM | POA: Diagnosis not present

## 2022-02-22 HISTORY — PX: XI ROBOTIC LAPAROSCOPIC ASSISTED APPENDECTOMY: SHX6877

## 2022-02-22 HISTORY — PX: XI ROBOTIC ASSISTED LOWER ANTERIOR RESECTION: SHX6558

## 2022-02-22 HISTORY — PX: PROCTOSCOPY: SHX2266

## 2022-02-22 LAB — GLUCOSE, CAPILLARY
Glucose-Capillary: 150 mg/dL — ABNORMAL HIGH (ref 70–99)
Glucose-Capillary: 156 mg/dL — ABNORMAL HIGH (ref 70–99)

## 2022-02-22 LAB — ABO/RH: ABO/RH(D): AB NEG

## 2022-02-22 LAB — TYPE AND SCREEN
ABO/RH(D): AB NEG
Antibody Screen: NEGATIVE

## 2022-02-22 SURGERY — COLECTOMY, SIGMOID, ROBOT-ASSISTED
Anesthesia: General

## 2022-02-22 MED ORDER — SODIUM CHLORIDE (PF) 0.9 % IJ SOLN
INTRAMUSCULAR | Status: DC | PRN
  Administered 2022-02-22: 30 mL

## 2022-02-22 MED ORDER — ALVIMOPAN 12 MG PO CAPS
12.0000 mg | ORAL_CAPSULE | ORAL | Status: AC
  Administered 2022-02-22: 12 mg via ORAL
  Filled 2022-02-22: qty 1

## 2022-02-22 MED ORDER — METHOCARBAMOL 500 MG PO TABS
1000.0000 mg | ORAL_TABLET | Freq: Four times a day (QID) | ORAL | Status: DC | PRN
  Administered 2022-02-23: 1000 mg via ORAL
  Filled 2022-02-22: qty 2

## 2022-02-22 MED ORDER — TRAMADOL HCL 50 MG PO TABS
50.0000 mg | ORAL_TABLET | Freq: Four times a day (QID) | ORAL | 0 refills | Status: DC | PRN
  Filled 2022-02-22: qty 20, 3d supply, fill #0

## 2022-02-22 MED ORDER — METHYLENE BLUE 1 % INJ SOLN
INTRAVENOUS | Status: AC
  Filled 2022-02-22: qty 10

## 2022-02-22 MED ORDER — OXYCODONE HCL 5 MG PO TABS: 5.0000 mg | ORAL_TABLET | Freq: Once | ORAL | Status: DC | PRN

## 2022-02-22 MED ORDER — ONDANSETRON HCL 4 MG PO TABS: 4.0000 mg | ORAL_TABLET | Freq: Four times a day (QID) | ORAL | Status: DC | PRN

## 2022-02-22 MED ORDER — ONDANSETRON HCL 4 MG/2ML IJ SOLN
INTRAMUSCULAR | Status: DC | PRN
  Administered 2022-02-22 (×2): 4 mg via INTRAVENOUS

## 2022-02-22 MED ORDER — FENTANYL CITRATE (PF) 100 MCG/2ML IJ SOLN
INTRAMUSCULAR | Status: DC | PRN
  Administered 2022-02-22: 50 ug via INTRAVENOUS
  Administered 2022-02-22: 100 ug via INTRAVENOUS

## 2022-02-22 MED ORDER — BUPIVACAINE HCL (PF) 0.5 % IJ SOLN
INTRAMUSCULAR | Status: AC
  Filled 2022-02-22: qty 30

## 2022-02-22 MED ORDER — DIPHENHYDRAMINE HCL 12.5 MG/5ML PO ELIX: 12.5000 mg | ORAL_SOLUTION | Freq: Four times a day (QID) | ORAL | Status: DC | PRN

## 2022-02-22 MED ORDER — ONDANSETRON HCL 4 MG/2ML IJ SOLN: 4.0000 mg | Freq: Four times a day (QID) | INTRAMUSCULAR | Status: DC | PRN

## 2022-02-22 MED ORDER — PHENYLEPHRINE HCL (PRESSORS) 10 MG/ML IV SOLN
INTRAVENOUS | Status: AC
  Filled 2022-02-22: qty 1

## 2022-02-22 MED ORDER — EMPAGLIFLOZIN 25 MG PO TABS
25.0000 mg | ORAL_TABLET | Freq: Every day | ORAL | Status: DC
  Administered 2022-02-23 – 2022-02-24 (×2): 25 mg via ORAL
  Filled 2022-02-22 (×2): qty 1

## 2022-02-22 MED ORDER — CELECOXIB 200 MG PO CAPS
200.0000 mg | ORAL_CAPSULE | ORAL | Status: AC
  Administered 2022-02-22: 200 mg via ORAL
  Filled 2022-02-22: qty 1

## 2022-02-22 MED ORDER — ENSURE PRE-SURGERY PO LIQD
592.0000 mL | Freq: Once | ORAL | Status: DC
  Filled 2022-02-22: qty 592

## 2022-02-22 MED ORDER — EPHEDRINE SULFATE-NACL 50-0.9 MG/10ML-% IV SOSY
PREFILLED_SYRINGE | INTRAVENOUS | Status: DC | PRN
  Administered 2022-02-22: 5 mg via INTRAVENOUS

## 2022-02-22 MED ORDER — STERILE WATER FOR IRRIGATION IR SOLN
Status: DC | PRN
  Administered 2022-02-22: 3000 mL

## 2022-02-22 MED ORDER — ACETAMINOPHEN 500 MG PO TABS
1000.0000 mg | ORAL_TABLET | Freq: Four times a day (QID) | ORAL | Status: DC
  Administered 2022-02-22 – 2022-02-24 (×6): 1000 mg via ORAL
  Filled 2022-02-22 (×5): qty 2

## 2022-02-22 MED ORDER — ONDANSETRON HCL 4 MG/2ML IJ SOLN
INTRAMUSCULAR | Status: AC
  Filled 2022-02-22: qty 2

## 2022-02-22 MED ORDER — ROCURONIUM BROMIDE 10 MG/ML (PF) SYRINGE
PREFILLED_SYRINGE | INTRAVENOUS | Status: DC | PRN
  Administered 2022-02-22: 10 mg via INTRAVENOUS
  Administered 2022-02-22: 60 mg via INTRAVENOUS
  Administered 2022-02-22: 40 mg via INTRAVENOUS

## 2022-02-22 MED ORDER — LACTATED RINGERS IV BOLUS: 1000.0000 mL | Freq: Three times a day (TID) | INTRAVENOUS | Status: DC | PRN

## 2022-02-22 MED ORDER — ALBUTEROL SULFATE (2.5 MG/3ML) 0.083% IN NEBU
INHALATION_SOLUTION | RESPIRATORY_TRACT | Status: AC
  Filled 2022-02-22: qty 3

## 2022-02-22 MED ORDER — LIP MEDEX EX OINT
TOPICAL_OINTMENT | Freq: Two times a day (BID) | CUTANEOUS | Status: DC
  Administered 2022-02-24: 75 via TOPICAL
  Filled 2022-02-22 (×3): qty 7

## 2022-02-22 MED ORDER — ACETAMINOPHEN 500 MG PO TABS
ORAL_TABLET | ORAL | Status: AC
  Filled 2022-02-22: qty 2

## 2022-02-22 MED ORDER — STERILE WATER FOR IRRIGATION IR SOLN
Status: DC | PRN
  Administered 2022-02-22: 300 mL via INTRAVESICAL

## 2022-02-22 MED ORDER — LACTATED RINGERS IV SOLN: INTRAVENOUS | Status: DC | PRN

## 2022-02-22 MED ORDER — ALUM & MAG HYDROXIDE-SIMETH 200-200-20 MG/5ML PO SUSP: 30.0000 mL | Freq: Four times a day (QID) | ORAL | Status: DC | PRN

## 2022-02-22 MED ORDER — HYDROMORPHONE HCL 1 MG/ML IJ SOLN
0.5000 mg | INTRAMUSCULAR | Status: DC | PRN
  Administered 2022-02-23 (×2): 1 mg via INTRAVENOUS
  Filled 2022-02-22 (×2): qty 1

## 2022-02-22 MED ORDER — SODIUM CHLORIDE 0.9% FLUSH: 3.0000 mL | INTRAVENOUS | Status: DC | PRN

## 2022-02-22 MED ORDER — BUPIVACAINE LIPOSOME 1.3 % IJ SUSP: 20.0000 mL | Freq: Once | INTRAMUSCULAR | Status: DC

## 2022-02-22 MED ORDER — BUPIVACAINE HCL (PF) 0.5 % IJ SOLN
INTRAMUSCULAR | Status: DC | PRN
  Administered 2022-02-22: 30 mL

## 2022-02-22 MED ORDER — ENOXAPARIN SODIUM 40 MG/0.4ML IJ SOSY
40.0000 mg | PREFILLED_SYRINGE | Freq: Once | INTRAMUSCULAR | Status: AC
  Administered 2022-02-22: 40 mg via SUBCUTANEOUS
  Filled 2022-02-22: qty 0.4

## 2022-02-22 MED ORDER — SODIUM CHLORIDE 0.9 % IV SOLN
2.0000 g | INTRAVENOUS | Status: AC
  Administered 2022-02-22: 2 g via INTRAVENOUS
  Filled 2022-02-22: qty 2

## 2022-02-22 MED ORDER — PROCHLORPERAZINE EDISYLATE 10 MG/2ML IJ SOLN: 5.0000 mg | Freq: Four times a day (QID) | INTRAMUSCULAR | Status: DC | PRN

## 2022-02-22 MED ORDER — STERILE WATER FOR INJECTION IJ SOLN
INTRAMUSCULAR | Status: AC
  Filled 2022-02-22: qty 10

## 2022-02-22 MED ORDER — POLYETHYLENE GLYCOL 3350 17 GM/SCOOP PO POWD: 1.0000 | Freq: Once | ORAL | Status: DC

## 2022-02-22 MED ORDER — HYDROMORPHONE HCL 1 MG/ML IJ SOLN
0.2500 mg | INTRAMUSCULAR | Status: DC | PRN
  Administered 2022-02-22: 0.5 mg via INTRAVENOUS

## 2022-02-22 MED ORDER — HYDRALAZINE HCL 20 MG/ML IJ SOLN: 10.0000 mg | INTRAMUSCULAR | Status: DC | PRN

## 2022-02-22 MED ORDER — ENSURE SURGERY PO LIQD
237.0000 mL | Freq: Two times a day (BID) | ORAL | Status: DC
  Administered 2022-02-23 – 2022-02-24 (×3): 237 mL via ORAL

## 2022-02-22 MED ORDER — NEOMYCIN SULFATE 500 MG PO TABS: 1000.0000 mg | ORAL_TABLET | ORAL | Status: DC

## 2022-02-22 MED ORDER — PHENOL 1.4 % MT LIQD: 2.0000 | OROMUCOSAL | Status: DC | PRN

## 2022-02-22 MED ORDER — SUCCINYLCHOLINE CHLORIDE 200 MG/10ML IV SOSY
PREFILLED_SYRINGE | INTRAVENOUS | Status: DC | PRN
  Administered 2022-02-22: 140 mg via INTRAVENOUS

## 2022-02-22 MED ORDER — PROCHLORPERAZINE MALEATE 10 MG PO TABS: 10.0000 mg | ORAL_TABLET | Freq: Four times a day (QID) | ORAL | Status: DC | PRN

## 2022-02-22 MED ORDER — SODIUM CHLORIDE 0.9 % IV SOLN: 250.0000 mL | INTRAVENOUS | Status: DC | PRN

## 2022-02-22 MED ORDER — METOPROLOL TARTRATE 5 MG/5ML IV SOLN: 5.0000 mg | Freq: Four times a day (QID) | INTRAVENOUS | Status: DC | PRN

## 2022-02-22 MED ORDER — LACTATED RINGERS IV SOLN: INTRAVENOUS | Status: DC

## 2022-02-22 MED ORDER — ENOXAPARIN SODIUM 40 MG/0.4ML IJ SOSY
40.0000 mg | PREFILLED_SYRINGE | INTRAMUSCULAR | Status: DC
  Administered 2022-02-23 – 2022-02-24 (×2): 40 mg via SUBCUTANEOUS
  Filled 2022-02-22 (×2): qty 0.4

## 2022-02-22 MED ORDER — TRAMADOL HCL 50 MG PO TABS
50.0000 mg | ORAL_TABLET | Freq: Four times a day (QID) | ORAL | Status: DC | PRN
  Administered 2022-02-23 – 2022-02-24 (×3): 100 mg via ORAL
  Filled 2022-02-22 (×3): qty 2

## 2022-02-22 MED ORDER — METRONIDAZOLE 500 MG PO TABS: 1000.0000 mg | ORAL_TABLET | ORAL | Status: DC

## 2022-02-22 MED ORDER — ALLOPURINOL 300 MG PO TABS
300.0000 mg | ORAL_TABLET | Freq: Every day | ORAL | Status: DC
  Administered 2022-02-22 – 2022-02-24 (×3): 300 mg via ORAL
  Filled 2022-02-22 (×3): qty 1

## 2022-02-22 MED ORDER — FENTANYL CITRATE (PF) 100 MCG/2ML IJ SOLN
INTRAMUSCULAR | Status: AC
  Filled 2022-02-22: qty 2

## 2022-02-22 MED ORDER — ENSURE PRE-SURGERY PO LIQD
296.0000 mL | Freq: Once | ORAL | Status: DC
  Filled 2022-02-22: qty 296

## 2022-02-22 MED ORDER — SODIUM CHLORIDE 0.9% FLUSH
3.0000 mL | Freq: Two times a day (BID) | INTRAVENOUS | Status: DC
  Administered 2022-02-22 – 2022-02-24 (×4): 3 mL via INTRAVENOUS

## 2022-02-22 MED ORDER — OXYCODONE HCL 5 MG/5ML PO SOLN: 5.0000 mg | Freq: Once | ORAL | Status: DC | PRN

## 2022-02-22 MED ORDER — ALVIMOPAN 12 MG PO CAPS
12.0000 mg | ORAL_CAPSULE | Freq: Two times a day (BID) | ORAL | Status: DC
  Administered 2022-02-23 (×2): 12 mg via ORAL
  Filled 2022-02-22 (×2): qty 1

## 2022-02-22 MED ORDER — METHOCARBAMOL 1000 MG/10ML IJ SOLN
1000.0000 mg | Freq: Four times a day (QID) | INTRAVENOUS | Status: DC | PRN
  Filled 2022-02-22 (×2): qty 10

## 2022-02-22 MED ORDER — BUPIVACAINE HCL (PF) 0.25 % IJ SOLN
INTRAMUSCULAR | Status: AC
  Filled 2022-02-22: qty 30

## 2022-02-22 MED ORDER — 0.9 % SODIUM CHLORIDE (POUR BTL) OPTIME
TOPICAL | Status: DC | PRN
  Administered 2022-02-22: 1000 mL
  Administered 2022-02-22: 2000 mL

## 2022-02-22 MED ORDER — BUPIVACAINE LIPOSOME 1.3 % IJ SUSP
INTRAMUSCULAR | Status: DC | PRN
  Administered 2022-02-22: 20 mL

## 2022-02-22 MED ORDER — SIMETHICONE 80 MG PO CHEW: 40.0000 mg | CHEWABLE_TABLET | Freq: Four times a day (QID) | ORAL | Status: DC | PRN

## 2022-02-22 MED ORDER — ENALAPRILAT 1.25 MG/ML IV SOLN: 0.6250 mg | Freq: Four times a day (QID) | INTRAVENOUS | Status: DC | PRN

## 2022-02-22 MED ORDER — SODIUM CHLORIDE (PF) 0.9 % IJ SOLN
INTRAMUSCULAR | Status: AC
  Filled 2022-02-22: qty 20

## 2022-02-22 MED ORDER — CHLORHEXIDINE GLUCONATE 0.12 % MT SOLN
15.0000 mL | Freq: Once | OROMUCOSAL | Status: AC
  Administered 2022-02-22: 15 mL via OROMUCOSAL

## 2022-02-22 MED ORDER — KETAMINE HCL 10 MG/ML IJ SOLN
INTRAMUSCULAR | Status: AC
  Filled 2022-02-22: qty 1

## 2022-02-22 MED ORDER — ACETAMINOPHEN 500 MG PO TABS
1000.0000 mg | ORAL_TABLET | ORAL | Status: AC
  Administered 2022-02-22: 1000 mg via ORAL
  Filled 2022-02-22: qty 2

## 2022-02-22 MED ORDER — SODIUM CHLORIDE 0.9 % IV SOLN
2.0000 g | Freq: Two times a day (BID) | INTRAVENOUS | Status: AC
  Administered 2022-02-22: 2 g via INTRAVENOUS
  Filled 2022-02-22: qty 2

## 2022-02-22 MED ORDER — DEXAMETHASONE SODIUM PHOSPHATE 10 MG/ML IJ SOLN
INTRAMUSCULAR | Status: DC | PRN
  Administered 2022-02-22 (×2): 10 mg via INTRAVENOUS

## 2022-02-22 MED ORDER — MELATONIN 3 MG PO TABS: 3.0000 mg | ORAL_TABLET | Freq: Every evening | ORAL | Status: DC | PRN

## 2022-02-22 MED ORDER — EPHEDRINE 5 MG/ML INJ
INTRAVENOUS | Status: AC
  Filled 2022-02-22: qty 5

## 2022-02-22 MED ORDER — MAGIC MOUTHWASH: 15.0000 mL | Freq: Four times a day (QID) | ORAL | Status: DC | PRN

## 2022-02-22 MED ORDER — MIDAZOLAM HCL 2 MG/2ML IJ SOLN
INTRAMUSCULAR | Status: AC
  Filled 2022-02-22: qty 2

## 2022-02-22 MED ORDER — MIDAZOLAM HCL 5 MG/5ML IJ SOLN
INTRAMUSCULAR | Status: DC | PRN
  Administered 2022-02-22: 2 mg via INTRAVENOUS

## 2022-02-22 MED ORDER — ONDANSETRON HCL 4 MG/2ML IJ SOLN: 4.0000 mg | Freq: Once | INTRAMUSCULAR | Status: DC | PRN

## 2022-02-22 MED ORDER — CALCIUM POLYCARBOPHIL 625 MG PO TABS
625.0000 mg | ORAL_TABLET | Freq: Two times a day (BID) | ORAL | Status: DC
  Administered 2022-02-22 – 2022-02-24 (×4): 625 mg via ORAL
  Filled 2022-02-22 (×4): qty 1

## 2022-02-22 MED ORDER — FENTANYL CITRATE (PF) 250 MCG/5ML IJ SOLN
INTRAMUSCULAR | Status: AC
  Filled 2022-02-22: qty 5

## 2022-02-22 MED ORDER — HYDROMORPHONE HCL 1 MG/ML IJ SOLN
INTRAMUSCULAR | Status: AC
  Filled 2022-02-22: qty 1

## 2022-02-22 MED ORDER — ORAL CARE MOUTH RINSE: 15.0000 mL | Freq: Once | OROMUCOSAL | Status: AC

## 2022-02-22 MED ORDER — ALBUTEROL SULFATE (2.5 MG/3ML) 0.083% IN NEBU
2.5000 mg | INHALATION_SOLUTION | Freq: Once | RESPIRATORY_TRACT | Status: AC
  Administered 2022-02-22: 2.5 mg via RESPIRATORY_TRACT

## 2022-02-22 MED ORDER — AMLODIPINE BESYLATE 10 MG PO TABS
10.0000 mg | ORAL_TABLET | Freq: Every day | ORAL | Status: DC
  Administered 2022-02-22 – 2022-02-24 (×3): 10 mg via ORAL
  Filled 2022-02-22 (×3): qty 1

## 2022-02-22 MED ORDER — BISACODYL 5 MG PO TBEC: 20.0000 mg | DELAYED_RELEASE_TABLET | Freq: Once | ORAL | Status: DC

## 2022-02-22 MED ORDER — GABAPENTIN 300 MG PO CAPS
300.0000 mg | ORAL_CAPSULE | ORAL | Status: AC
  Administered 2022-02-22: 300 mg via ORAL
  Filled 2022-02-22: qty 1

## 2022-02-22 MED ORDER — DIPHENHYDRAMINE HCL 50 MG/ML IJ SOLN: 12.5000 mg | Freq: Four times a day (QID) | INTRAMUSCULAR | Status: DC | PRN

## 2022-02-22 MED ORDER — BUPIVACAINE LIPOSOME 1.3 % IJ SUSP
INTRAMUSCULAR | Status: AC
  Filled 2022-02-22: qty 20

## 2022-02-22 MED ORDER — SODIUM CHLORIDE (PF) 0.9 % IJ SOLN
INTRAMUSCULAR | Status: AC
  Filled 2022-02-22: qty 10

## 2022-02-22 MED ORDER — ASPIRIN 81 MG PO TBEC
81.0000 mg | DELAYED_RELEASE_TABLET | Freq: Every day | ORAL | Status: DC
  Administered 2022-02-22 – 2022-02-24 (×3): 81 mg via ORAL
  Filled 2022-02-22 (×3): qty 1

## 2022-02-22 MED ORDER — LIDOCAINE 2% (20 MG/ML) 5 ML SYRINGE
INTRAMUSCULAR | Status: DC | PRN
  Administered 2022-02-22: 1.5 mg/kg/h via INTRAVENOUS
  Administered 2022-02-22: 80 mg via INTRAVENOUS

## 2022-02-22 MED ORDER — KETAMINE HCL 10 MG/ML IJ SOLN
INTRAMUSCULAR | Status: DC | PRN
  Administered 2022-02-22: 50 mg via INTRAVENOUS

## 2022-02-22 MED ORDER — PROPOFOL 10 MG/ML IV BOLUS
INTRAVENOUS | Status: DC | PRN
  Administered 2022-02-22: 140 mg via INTRAVENOUS

## 2022-02-22 MED ORDER — SPY AGENT GREEN - (INDOCYANINE FOR INJECTION)
INTRAMUSCULAR | Status: DC | PRN
  Administered 2022-02-22: 7 mL via INTRAVENOUS

## 2022-02-22 MED ORDER — ATORVASTATIN CALCIUM 20 MG PO TABS
40.0000 mg | ORAL_TABLET | Freq: Every day | ORAL | Status: DC
  Administered 2022-02-22 – 2022-02-24 (×3): 40 mg via ORAL
  Filled 2022-02-22 (×3): qty 2

## 2022-02-22 MED ORDER — PROPOFOL 10 MG/ML IV BOLUS
INTRAVENOUS | Status: AC
  Filled 2022-02-22: qty 20

## 2022-02-22 MED ORDER — MENTHOL 3 MG MT LOZG: 1.0000 | LOZENGE | OROMUCOSAL | Status: DC | PRN

## 2022-02-22 SURGICAL SUPPLY — 129 items
ADAPTER GOLDBERG URETERAL (ADAPTER) IMPLANT
ADPR CATH 15X14FR FL DRN BG (ADAPTER)
APL PRP STRL LF DISP 70% ISPRP (MISCELLANEOUS)
APPLIER CLIP 5 13 M/L LIGAMAX5 (MISCELLANEOUS)
APPLIER CLIP ROT 10 11.4 M/L (STAPLE)
APR CLP MED LRG 11.4X10 (STAPLE)
APR CLP MED LRG 5 ANG JAW (MISCELLANEOUS)
BAG COUNTER SPONGE SURGICOUNT (BAG) ×1 IMPLANT
BAG SPNG CNTER NS LX DISP (BAG) ×1
BAG URO CATCHER STRL LF (MISCELLANEOUS) ×1 IMPLANT
BLADE EXTENDED COATED 6.5IN (ELECTRODE) IMPLANT
CANNULA REDUC XI 12-8 STAPL (CANNULA)
CANNULA REDUCER 12-8 DVNC XI (CANNULA) IMPLANT
CATH URETL OPEN END 6FR 70 (CATHETERS) IMPLANT
CELLS DAT CNTRL 66122 CELL SVR (MISCELLANEOUS) IMPLANT
CHLORAPREP W/TINT 26 (MISCELLANEOUS) IMPLANT
CLIP APPLIE 5 13 M/L LIGAMAX5 (MISCELLANEOUS) IMPLANT
CLIP APPLIE ROT 10 11.4 M/L (STAPLE) IMPLANT
CLOTH BEACON ORANGE TIMEOUT ST (SAFETY) ×1 IMPLANT
COVER SURGICAL LIGHT HANDLE (MISCELLANEOUS) ×2 IMPLANT
COVER TIP SHEARS 8 DVNC (MISCELLANEOUS) ×1 IMPLANT
COVER TIP SHEARS 8MM DA VINCI (MISCELLANEOUS) ×2
DEVICE TROCAR PUNCTURE CLOSURE (ENDOMECHANICALS) IMPLANT
DRAIN CHANNEL 19F RND (DRAIN) IMPLANT
DRAPE ARM DVNC X/XI (DISPOSABLE) ×4 IMPLANT
DRAPE COLUMN DVNC XI (DISPOSABLE) ×1 IMPLANT
DRAPE DA VINCI XI ARM (DISPOSABLE) ×4
DRAPE DA VINCI XI COLUMN (DISPOSABLE) ×1
DRAPE SURG IRRIG POUCH 19X23 (DRAPES) ×1 IMPLANT
DRSG OPSITE POSTOP 4X10 (GAUZE/BANDAGES/DRESSINGS) IMPLANT
DRSG OPSITE POSTOP 4X6 (GAUZE/BANDAGES/DRESSINGS) IMPLANT
DRSG OPSITE POSTOP 4X8 (GAUZE/BANDAGES/DRESSINGS) IMPLANT
DRSG TEGADERM 2-3/8X2-3/4 SM (GAUZE/BANDAGES/DRESSINGS) ×5 IMPLANT
DRSG TEGADERM 4X4.75 (GAUZE/BANDAGES/DRESSINGS) IMPLANT
ELECT PENCIL ROCKER SW 15FT (MISCELLANEOUS) ×1 IMPLANT
ELECT REM PT RETURN 15FT ADLT (MISCELLANEOUS) ×1 IMPLANT
ENDOLOOP SUT PDS II  0 18 (SUTURE)
ENDOLOOP SUT PDS II 0 18 (SUTURE) IMPLANT
EVACUATOR SILICONE 100CC (DRAIN) IMPLANT
GAUZE SPONGE 2X2 STRL 8-PLY (GAUZE/BANDAGES/DRESSINGS) ×1 IMPLANT
GLOVE ECLIPSE 8.0 STRL XLNG CF (GLOVE) ×3 IMPLANT
GLOVE INDICATOR 8.0 STRL GRN (GLOVE) ×3 IMPLANT
GLOVE SURG LX STRL 7.5 STRW (GLOVE) ×1 IMPLANT
GOWN SRG XL LVL 4 BRTHBL STRL (GOWNS) ×1 IMPLANT
GOWN STRL NON-REIN XL LVL4 (GOWNS) ×1
GOWN STRL REUS W/ TWL XL LVL3 (GOWN DISPOSABLE) ×4 IMPLANT
GOWN STRL REUS W/TWL XL LVL3 (GOWN DISPOSABLE) ×4
GRASPER SUT TROCAR 14GX15 (MISCELLANEOUS) IMPLANT
GUIDEWIRE ANG ZIPWIRE 038X150 (WIRE) IMPLANT
GUIDEWIRE STR DUAL SENSOR (WIRE) IMPLANT
HOLDER FOLEY CATH W/STRAP (MISCELLANEOUS) ×1 IMPLANT
IRRIG SUCT STRYKERFLOW 2 WTIP (MISCELLANEOUS) ×1
IRRIGATION SUCT STRKRFLW 2 WTP (MISCELLANEOUS) ×1 IMPLANT
KIT PROCEDURE DA VINCI SI (MISCELLANEOUS) ×1
KIT PROCEDURE DVNC SI (MISCELLANEOUS) ×1 IMPLANT
KIT SIGMOIDOSCOPE (SET/KITS/TRAYS/PACK) IMPLANT
KIT TURNOVER KIT A (KITS) IMPLANT
MANIFOLD NEPTUNE II (INSTRUMENTS) ×1 IMPLANT
NDL INSUFFLATION 14GA 120MM (NEEDLE) ×1 IMPLANT
NEEDLE INSUFFLATION 14GA 120MM (NEEDLE) ×1 IMPLANT
PACK CARDIOVASCULAR III (CUSTOM PROCEDURE TRAY) ×1 IMPLANT
PACK COLON (CUSTOM PROCEDURE TRAY) ×1 IMPLANT
PACK CYSTO (CUSTOM PROCEDURE TRAY) ×1 IMPLANT
PAD POSITIONING PINK XL (MISCELLANEOUS) ×1 IMPLANT
PROTECTOR NERVE ULNAR (MISCELLANEOUS) ×2 IMPLANT
RELOAD STAPLE 45 3.5 BLU DVNC (STAPLE) IMPLANT
RELOAD STAPLE 45 4.3 GRN DVNC (STAPLE) IMPLANT
RELOAD STAPLE 60 3.5 BLU DVNC (STAPLE) IMPLANT
RELOAD STAPLE 60 4.3 GRN DVNC (STAPLE) IMPLANT
RELOAD STAPLER 3.5X45 BLU DVNC (STAPLE) IMPLANT
RELOAD STAPLER 3.5X60 BLU DVNC (STAPLE) ×1 IMPLANT
RELOAD STAPLER 4.3X45 GRN DVNC (STAPLE) IMPLANT
RELOAD STAPLER 4.3X60 GRN DVNC (STAPLE) ×1 IMPLANT
RETRACTOR WND ALEXIS 18 MED (MISCELLANEOUS) IMPLANT
RTRCTR WOUND ALEXIS 18CM MED (MISCELLANEOUS)
SCISSORS LAP 5X35 DISP (ENDOMECHANICALS) ×1 IMPLANT
SEAL CANN UNIV 5-8 DVNC XI (MISCELLANEOUS) ×3 IMPLANT
SEAL XI 5MM-8MM UNIVERSAL (MISCELLANEOUS) ×4
SEALER VESSEL DA VINCI XI (MISCELLANEOUS) ×1
SEALER VESSEL EXT DVNC XI (MISCELLANEOUS) ×1 IMPLANT
SET IRRIG Y TYPE TUR BLADDER L (SET/KITS/TRAYS/PACK) IMPLANT
SOLUTION ELECTROLUBE (MISCELLANEOUS) ×1 IMPLANT
SPIKE FLUID TRANSFER (MISCELLANEOUS) ×1 IMPLANT
STAPLER 45 DA VINCI SURE FORM (STAPLE)
STAPLER 45 SUREFORM DVNC (STAPLE) IMPLANT
STAPLER 60 DA VINCI SURE FORM (STAPLE) ×1
STAPLER 60 SUREFORM DVNC (STAPLE) IMPLANT
STAPLER CANNULA SEAL DVNC XI (STAPLE) ×1 IMPLANT
STAPLER CANNULA SEAL XI (STAPLE) ×1
STAPLER ECHELON POWER CIR 29 (STAPLE) IMPLANT
STAPLER ECHELON POWER CIR 31 (STAPLE) IMPLANT
STAPLER RELOAD 3.5X45 BLU DVNC (STAPLE)
STAPLER RELOAD 3.5X45 BLUE (STAPLE)
STAPLER RELOAD 3.5X60 BLU DVNC (STAPLE) ×1
STAPLER RELOAD 3.5X60 BLUE (STAPLE) ×1
STAPLER RELOAD 4.3X45 GREEN (STAPLE)
STAPLER RELOAD 4.3X45 GRN DVNC (STAPLE)
STAPLER RELOAD 4.3X60 GREEN (STAPLE) ×1
STAPLER RELOAD 4.3X60 GRN DVNC (STAPLE) ×1
STOPCOCK 4 WAY LG BORE MALE ST (IV SETS) ×2 IMPLANT
SURGILUBE 2OZ TUBE FLIPTOP (MISCELLANEOUS) IMPLANT
SUT MNCRL AB 4-0 PS2 18 (SUTURE) ×1 IMPLANT
SUT PDS AB 1 CT1 27 (SUTURE) ×2 IMPLANT
SUT PROLENE 0 CT 2 (SUTURE) IMPLANT
SUT PROLENE 2 0 KS (SUTURE) IMPLANT
SUT PROLENE 2 0 SH DA (SUTURE) IMPLANT
SUT SILK 2 0 (SUTURE)
SUT SILK 2 0 SH CR/8 (SUTURE) IMPLANT
SUT SILK 2-0 18XBRD TIE 12 (SUTURE) IMPLANT
SUT SILK 3 0 (SUTURE)
SUT SILK 3 0 SH CR/8 (SUTURE) ×1 IMPLANT
SUT SILK 3-0 18XBRD TIE 12 (SUTURE) IMPLANT
SUT V-LOC BARB 180 2/0GR6 GS22 (SUTURE)
SUT VIC AB 3-0 SH 18 (SUTURE) IMPLANT
SUT VIC AB 3-0 SH 27 (SUTURE)
SUT VIC AB 3-0 SH 27XBRD (SUTURE) IMPLANT
SUT VICRYL 0 UR6 27IN ABS (SUTURE) ×1 IMPLANT
SUTURE V-LC BRB 180 2/0GR6GS22 (SUTURE) IMPLANT
SYR 20ML ECCENTRIC (SYRINGE) ×1 IMPLANT
SYS LAPSCP GELPORT 120MM (MISCELLANEOUS)
SYS WOUND ALEXIS 18CM MED (MISCELLANEOUS) ×1
SYSTEM LAPSCP GELPORT 120MM (MISCELLANEOUS) IMPLANT
SYSTEM WOUND ALEXIS 18CM MED (MISCELLANEOUS) ×1 IMPLANT
TOWEL OR NON WOVEN STRL DISP B (DISPOSABLE) ×1 IMPLANT
TRAY FOLEY MTR SLVR 16FR STAT (SET/KITS/TRAYS/PACK) ×1 IMPLANT
TROCAR ADV FIXATION 5X100MM (TROCAR) ×1 IMPLANT
TUBING CONNECTING 10 (TUBING) ×3 IMPLANT
TUBING INSUFFLATION 10FT LAP (TUBING) ×1 IMPLANT
TUBING UROLOGY SET (TUBING) IMPLANT

## 2022-02-22 NOTE — Anesthesia Procedure Notes (Signed)
Procedure Name: Intubation Date/Time: 02/22/2022 7:39 AM  Performed by: Gean Maidens, CRNAPre-anesthesia Checklist: Patient identified, Emergency Drugs available, Suction available, Patient being monitored and Timeout performed Patient Re-evaluated:Patient Re-evaluated prior to induction Oxygen Delivery Method: Circle system utilized Preoxygenation: Pre-oxygenation with 100% oxygen Induction Type: IV induction Ventilation: Mask ventilation with difficulty and Two handed mask ventilation required Laryngoscope Size: Mac and 4 Grade View: Grade I Tube type: Oral Tube size: 7.5 mm Number of attempts: 1 Airway Equipment and Method: Stylet Placement Confirmation: ETT inserted through vocal cords under direct vision, positive ETCO2 and breath sounds checked- equal and bilateral Secured at: 23 cm Tube secured with: Tape Dental Injury: Teeth and Oropharynx as per pre-operative assessment

## 2022-02-22 NOTE — Discharge Instructions (Signed)
SURGERY: POST OP INSTRUCTIONS (Surgery for small bowel obstruction, colon resection, etc)   ######################################################################  EAT Gradually transition to a high fiber diet with a fiber supplement over the next few days after discharge  WALK Walk an hour a day.  Control your pain to do that.    CONTROL PAIN Control pain so that you can walk, sleep, tolerate sneezing/coughing, go up/down stairs.  HAVE A BOWEL MOVEMENT DAILY Keep your bowels regular to avoid problems.  OK to try a laxative to override constipation.  OK to use an antidairrheal to slow down diarrhea.  Call if not better after 2 tries  CALL IF YOU HAVE PROBLEMS/CONCERNS Call if you are still struggling despite following these instructions. Call if you have concerns not answered by these instructions  ######################################################################   DIET Follow a light diet the first few days at home.  Start with a bland diet such as soups, liquids, starchy foods, low fat foods, etc.  If you feel full, bloated, or constipated, stay on a ful liquid or pureed/blenderized diet for a few days until you feel better and no longer constipated. Be sure to drink plenty of fluids every day to avoid getting dehydrated (feeling dizzy, not urinating, etc.). Gradually add a fiber supplement to your diet over the next week.  Gradually get back to a regular solid diet.  Avoid fast food or heavy meals the first week as you are more likely to get nauseated. It is expected for your digestive tract to need a few months to get back to normal.  It is common for your bowel movements and stools to be irregular.  You will have occasional bloating and cramping that should eventually fade away.  Until you are eating solid food normally, off all pain medications, and back to regular activities; your bowels will not be normal. Focus on eating a low-fat, high fiber diet the rest of your life  (See Getting to [place], below).  CARE of your INCISION or WOUND  It is good for closed incisions and even open wounds to be washed every day.  Shower every day.  Short baths are fine.  Wash the incisions and wounds clean with soap & water.    You may leave closed incisions open to air if it is dry.   You may cover the incision with clean gauze & replace it after your daily shower for comfort.  TEGADERM:  You have clear gauze band-aid dressings over your closed incision(s).  Remove the dressings 3 days after surgery.= 1/28    If you have an open wound with a wound vac, see wound vac care instructions.    ACTIVITIES as tolerated Start light daily activities --- self-care, walking, climbing stairs-- beginning the day after surgery.  Gradually increase activities as tolerated.  Control your pain to be active.  Stop when you are tired.  Ideally, walk several times a day, eventually an hour a day.   Most people are back to most day-to-day activities in a few weeks.  It takes 4-8 weeks to get back to unrestricted, intense activity. If you can walk 30 minutes without difficulty, it is safe to try more intense activity such as jogging, treadmill, bicycling, low-impact aerobics, swimming, etc. Save the most intensive and strenuous activity for last (Usually 4-8 weeks after surgery) such as sit-ups, heavy lifting, contact sports, etc.  Refrain from any intense heavy lifting or straining until you are off narcotics for pain control.  You will have off days,  but things should improve week-by-week. DO NOT PUSH THROUGH PAIN.  Let pain be your guide: If it hurts to do something, don't do it.  Pain is your body warning you to avoid that activity for another week until the pain goes down. You may drive when you are no longer taking narcotic prescription pain medication, you can comfortably wear a seatbelt, and you can safely make sudden turns/stops to protect yourself without hesitating due to  pain. You may have sexual intercourse when it is comfortable. If it hurts to do something, stop.  MEDICATIONS Take your usually prescribed home medications unless otherwise directed.   Blood thinners:  Usually you can restart any strong blood thinners after the second postoperative day.  It is OK to take aspirin right away.     If you are on strong blood thinners (warfarin/Coumadin, Plavix, Xerelto, Eliquis, Pradaxa, etc), discuss with your surgeon, medicine PCP, and/or cardiologist for instructions on when to restart the blood thinner & if blood monitoring is needed (PT/INR blood check, etc).     PAIN CONTROL Pain after surgery or related to activity is often due to strain/injury to muscle, tendon, nerves and/or incisions.  This pain is usually short-term and will improve in a few months.  To help speed the process of healing and to get back to regular activity more quickly, DO THE FOLLOWING THINGS TOGETHER: Increase activity gradually.  DO NOT PUSH THROUGH PAIN Use Ice and/or Heat Try Gentle Massage and/or Stretching Take over the counter pain medication Take Narcotic prescription pain medication for more severe pain  Good pain control = faster recovery.  It is better to take more medicine to be more active than to stay in bed all day to avoid medications.  Increase activity gradually Avoid heavy lifting at first, then increase to lifting as tolerated over the next 6 weeks. Do not "push through" the pain.  Listen to your body and avoid positions and maneuvers than reproduce the pain.  Wait a few days before trying something more intense Walking an hour a day is encouraged to help your body recover faster and more safely.  Start slowly and stop when getting sore.  If you can walk 30 minutes without stopping or pain, you can try more intense activity (running, jogging, aerobics, cycling, swimming, treadmill, sex, sports, weightlifting, etc.) Remember: If it hurts to do it, then don't do  it! Use Ice and/or Heat You will have swelling and bruising around the incisions.  This will take several weeks to resolve. Ice packs or heating pads (6-8 times a day, 30-60 minutes at a time) will help sooth soreness & bruising. Some people prefer to use ice alone, heat alone, or alternate between ice & heat.  Experiment and see what works best for you.  Consider trying ice for the first few days to help decrease swelling and bruising; then, switch to heat to help relax sore spots and speed recovery. Shower every day.  Short baths are fine.  It feels good!  Keep the incisions and wounds clean with soap & water.   Try Gentle Massage and/or Stretching Massage at the area of pain many times a day Stop if you feel pain - do not overdo it Take over the counter pain medication This helps the muscle and nerve tissues become less irritable and calm down faster Choose ONE of the following over-the-counter anti-inflammatory medications: Acetaminophen '500mg'$  tabs (Tylenol) 1-2 pills with every meal and just before bedtime (avoid if you have liver problems  or if you have acetaminophen in you narcotic prescription) Naproxen '220mg'$  tabs (ex. Aleve, Naprosyn) 1-2 pills twice a day (avoid if you have kidney, stomach, IBD, or bleeding problems) Ibuprofen '200mg'$  tabs (ex. Advil, Motrin) 3-4 pills with every meal and just before bedtime (avoid if you have kidney, stomach, IBD, or bleeding problems) Take with food/snack several times a day as directed for at least 2 weeks to help keep pain / soreness down & more manageable. Take Narcotic prescription pain medication for more severe pain A prescription for strong pain control is often given to you upon discharge (for example: oxycodone/Percocet, hydrocodone/Norco/Vicodin, or tramadol/Ultram) Take your pain medication as prescribed. Be mindful that most narcotic prescriptions contain Tylenol (acetaminophen) as well - avoid taking too much Tylenol. If you are having  problems/concerns with the prescription medicine (does not control pain, nausea, vomiting, rash, itching, etc.), please call us [phone] to see if we need to switch you to a different pain medicine that will work better for you and/or control your side effects better. If you need a refill on your pain medication, you must call the office before 4 pm and on weekdays only.  By federal law, prescriptions for narcotics cannot be called into a pharmacy.  They must be filled out on paper & picked up from our office by the patient or authorized caretaker.  Prescriptions cannot be filled after 4 pm nor on weekends.    WHEN TO CALL us [phone] Severe uncontrolled or worsening pain  Fever over 101 F (38.5 C) Concerns with the incision: Worsening pain, redness, rash/hives, swelling, bleeding, or drainage Reactions / problems with new medications (itching, rash, hives, nausea, etc.) Nausea and/or vomiting Difficulty urinating Difficulty breathing Worsening fatigue, dizziness, lightheadedness, blurred vision Other concerns If you are not getting better after two weeks or are noticing you are getting worse, contact our office [phone] for further advice.  We may need to adjust your medications, re-evaluate you in the office, send you to the emergency room, or see what other things we can do to help. The clinic staff is available to answer your questions during regular business hours (8:30am-5pm).  Please don't hesitate to call and ask to speak to one of our nurses for clinical concerns.    A surgeon from [facility] is always on call at the hospitals 24 hours/day If you have a medical emergency, go to the nearest emergency room or call 911.  FOLLOW UP in our office One the day of your discharge from the hospital (or the next business weekday), please call [facility] to set up or confirm an appointment to see your surgeon in the office for a follow-up appointment.   Usually it is 2-3 weeks after your surgery.   If you have skin staples at your incision(s), let the office know so we can set up a time in the office for the nurse to remove them (usually around 10 days after surgery). Make sure that you call for appointments the day of discharge (or the next business weekday) from the hospital to ensure a convenient appointment time. IF YOU HAVE DISABILITY OR FAMILY LEAVE FORMS, BRING THEM TO THE OFFICE FOR PROCESSING.  DO NOT GIVE THEM TO YOUR DOCTOR.  [facility] [address], [place], [place], [place]  [address] ? [phone] - Main [phone] - [place],  [phone] - Fax [email or web address]    GETTING TO GOOD BOWEL HEALTH. It is expected for  your digestive tract to need a few months to get back to normal.  It is common for your bowel movements and stools to be irregular.  You will have occasional bloating and cramping that should eventually fade away.  Until you are eating solid food normally, off all pain medications, and back to regular activities; your bowels will not be normal.   Avoiding constipation The goal: ONE SOFT BOWEL MOVEMENT A DAY!    Drink plenty of fluids.  Choose water first. TAKE A FIBER SUPPLEMENT EVERY DAY THE REST OF YOUR LIFE During your first week back home, gradually add back a fiber supplement every day Experiment which form you can tolerate.   There are many forms such as powders, tablets, wafers, gummies, etc Psyllium bran (Metamucil), methylcellulose (Citrucel), Miralax or Glycolax, Benefiber, Flax Seed.  Adjust the dose week-by-week (1/2 dose/day to 6 doses a day) until you are moving your bowels 1-2 times a day.  Cut back the dose or try a different fiber product if it is giving you problems such as diarrhea or bloating. Sometimes a laxative is needed to help jump-start bowels if constipated until the fiber supplement can help regulate your bowels.  If you are tolerating eating  & you are farting, it is okay to try a gentle laxative such as double dose MiraLax, prune juice, or Milk of Magnesia.  Avoid using laxatives too often. Stool softeners can sometimes help counteract the constipating effects of narcotic pain medicines.  It can also cause diarrhea, so avoid using for too long. If you are still constipated despite taking fiber daily, eating solids, and a few doses of laxatives, call our office. Controlling diarrhea Try drinking liquids and eating bland foods for a few days to avoid stressing your intestines further. Avoid dairy products (especially milk & ice cream) for a short time.  The intestines often can lose the ability to digest lactose when stressed. Avoid foods that cause gassiness or bloating.  Typical foods include beans and other legumes, cabbage, broccoli, and dairy foods.  Avoid greasy, spicy, fast foods.  Every person has some sensitivity to other foods, so listen to your body and avoid those foods that trigger problems for you. Probiotics (such as active yogurt, Align, etc) may help repopulate the intestines and colon with normal bacteria and calm down a sensitive digestive tract Adding a fiber supplement gradually can help thicken stools by absorbing excess fluid and retrain the intestines to act more normally.  Slowly increase the dose over a few weeks.  Too much fiber too soon can backfire and cause cramping & bloating. It is okay to try and slow down diarrhea with a few doses of antidiarrheal medicines.   Bismuth subsalicylate (ex. Kayopectate, Pepto Bismol) for a few doses can help control diarrhea.  Avoid if pregnant.   Loperamide (Imodium) can slow down diarrhea.  Start with one tablet ('2mg'$ ) first.  Avoid if you are having fevers or severe pain.  ILEOSTOMY PATIENTS WILL HAVE CHRONIC DIARRHEA since their colon is not in use.    Drink plenty of liquids.  You will need to drink even more glasses of water/liquid a day to avoid getting dehydrated. Record  output from your ileostomy.  Expect to empty the bag every 3-4 hours at first.  Most people with a permanent ileostomy empty their bag 4-6 times at the least.   Use antidiarrheal medicine (especially Imodium) several times a day to avoid getting dehydrated.  Start with a dose at bedtime & breakfast.  Adjust up or down as needed.  Increase antidiarrheal medications as directed to avoid emptying the bag more than 8 times a day (every 3 hours). Work with your wound ostomy nurse to learn care for your ostomy.  See ostomy care instructions. TROUBLESHOOTING IRREGULAR BOWELS 1) Start with a soft & bland diet. No spicy, greasy, or fried foods.  2) Avoid gluten/wheat or dairy products from diet to see if symptoms improve. 3) Miralax 17gm or flax seed mixed in [place]. water or juice-daily. May use 2-4 times a day as needed. 4) Gas-X, Phazyme, etc. as needed for gas & bloating.  5) Prilosec (omeprazole) over-the-counter as needed 6)  Consider probiotics (Align, Activa, etc) to help calm the bowels down  Call your doctor if you are getting worse or not getting better.  Sometimes further testing (cultures, endoscopy, X-ray studies, CT scans, bloodwork, etc.) may be needed to help diagnose and treat the cause of the diarrhea. [facility], [place], [place], [place], [place]  [address] [phone] - Main.    [phone]  - Toll Free.   [phone] - Fax [email or web address]

## 2022-02-22 NOTE — Interval H&P Note (Signed)
History and Physical Interval Note:  02/22/2022 6:54 AM  Donald Bender  has presented today for surgery, with the diagnosis of COLOVESICAL FISTULA.  The various methods of treatment have been discussed with the patient and family. After consideration of risks, benefits and other options for treatment, the patient has consented to  Procedure(s): ROBOTIC RESECTION OF COLON RECTOSIGMOID, POSSIBLE BLADDER REPAIR (N/A) POSSIBLE OSTOMY (N/A) RIGID PROCTOSCOPY (N/A) CYSTOSCOPY with FIREFLY INJECTION (N/A) as a surgical intervention.  The patient's history has been reviewed, patient examined, no change in status, stable for surgery.  I have reviewed the patient's chart and labs.  Questions were answered to the patient's satisfaction.    I have re-reviewed the the patient's records, history, medications, and allergies.  I have re-examined the patient.  I again discussed intraoperative plans and goals of post-operative recovery.  The patient agrees to proceed.  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   Lipoma of transverse colon 01/01/2022   Hypokalemia 12/04/2021   Hypomagnesemia 12/04/2021   Type 2 diabetes mellitus with hyperglycemia ([place]) 12/01/2021   Hypertension 12/01/2021   Diverticulitis of colon with perforation 11/30/2021   Diverticulitis of large intestine with perforation 07/25/2021   Hypertensive urgency 07/25/2021   SIRS (systemic inflammatory response syndrome) ([place]) 07/25/2021   Leukocytosis 07/25/2021   Tobacco abuse 07/25/2021   Alcohol abuse 07/25/2021   Does not have health insurance 07/25/2021   Inguinal hernia 07/25/2021   Obesity (BMI 30-39.9) 07/25/2021   Acute respiratory failure with hypoxia ([place]) 07/25/2021   Polycythemia    Cellulitis 03/18/2020   Hypertriglyceridemia  07/06/2016   Gout 11/16/2015   Essential hypertension 09/11/2013   Type 2 diabetes mellitus without complication, without long-term current use of insulin ([place]) 09/11/2013    Past Medical History:  Diagnosis Date   Arthritis    osteoarthritis and gout   Diabetes mellitus without complication ([place])    Diverticulitis    Hypertension     Past Surgical History:  Procedure Laterality Date   COLONOSCOPY     HYDROCELE EXCISION / REPAIR     NO PAST SURGERIES      Social History   Socioeconomic History   Marital status: Married    Spouse name: Not on file   Number of children: 0   Years of education: Not on file   Highest education level: Not on file  Occupational History   Occupation: Architect  Tobacco Use   Smoking status: Every Day    Packs/day: 1.00    Types: Cigarettes   Smokeless tobacco: Never   Tobacco comments:    Smoking 1 ppd  Vaping Use   Vaping Use: Never used  Substance and Sexual Activity   Alcohol use: Not Currently    Alcohol/week: 6.0 standard drinks of alcohol    Types: 6 Cans of beer per week    Comment: quit drinking   Drug use: No   Sexual activity: Yes  Other Topics Concern   Not on file  Social History Narrative   Not on file   Social Determinants of Health   Financial Resource Strain: Not on file  Food Insecurity: Not on file  Transportation Needs: Not on file  Physical Activity: Not on file  Stress: Not on file  Social Connections: Not on file  Intimate Partner Violence: Not  on file    Family History  Problem Relation Age of Onset   Leukemia Mother 40   Diverticulitis Brother    Colon cancer Neg Hx    Stomach cancer Neg Hx    Esophageal cancer Neg Hx    Colon polyps Neg Hx     Facility-Administered Medications Prior to Admission  Medication Dose Route Frequency Provider Last Rate Last Admin   0.9 %  sodium chloride infusion  500 mL Intravenous Once Cirigliano, Vito V, DO       Medications Prior to Admission   Medication Sig Dispense Refill Last Dose   allopurinol (ZYLOPRIM) 300 MG tablet Take 1 tablet (300 mg total) by mouth daily. 30 tablet 6 02/20/2022   amLODipine (NORVASC) 10 MG tablet Take 1 tablet (10 mg total) by mouth daily. 90 tablet 3 02/20/2022   aspirin EC 81 MG tablet Take 1 tablet (81 mg total) by mouth daily. 30 tablet 5 02/20/2022   atorvastatin (LIPITOR) 40 MG tablet Take 1 tablet (40 mg total) by mouth daily. For cholesterol 30 tablet 6 02/20/2022   empagliflozin (JARDIANCE) 25 MG TABS tablet Take 1 tablet (25 mg total) by mouth daily before breakfast. 90 tablet 1 02/20/2022   losartan (COZAAR) 100 MG tablet Take 1 tablet (100 mg total) by mouth daily. 30 tablet 6 02/20/2022   metFORMIN (GLUCOPHAGE) 1000 MG tablet Take 1 tablet (1,000 mg total) by mouth 2 (two) times daily with a meal. 180 tablet 3 02/20/2022   metroNIDAZOLE (FLAGYL) 500 MG tablet Take 2 tablets (1,000 mg total) by mouth 3 (three) times daily for 3 doses according to  your procedure colon prep instructions 6 tablet 0    neomycin (MYCIFRADIN) 500 MG tablet SEE BOWEL PREP INSTRUCTIONS: Take 2 tablets by mouth at 2pm, 3pm, and 10pm the day prior to your colon operation. 6 tablet 0    neomycin (MYCIFRADIN) 500 MG tablet Take 2 tablets (1,000 mg total) by mouth 3 (three) times daily for 3 doses according to your procedure colon prep instructions 6 tablet 0    polyethylene glycol powder (GLYCOLAX/MIRALAX) 17 GM/SCOOP powder Take 238 g by mouth once for 1 dose. Take according to your procedure prep instructions. 238 g 0     Current Facility-Administered Medications  Medication Dose Route Frequency Provider Last Rate Last Admin   bisacodyl (DULCOLAX) EC tablet 20 mg  20 mg Oral Once Michael Boston, MD       bupivacaine liposome (EXPAREL) 1.3 % injection 266 mg  20 mL Infiltration Once Michael Boston, MD       cefoTEtan (CEFOTAN) 2 g in sodium chloride 0.9 % 100 mL IVPB  2 g Intravenous On Call to OR Michael Boston, MD       Derrill Memo ON  02/23/2022] feeding supplement (ENSURE PRE-SURGERY) liquid 296 mL  296 mL Oral Once Michael Boston, MD       feeding supplement (ENSURE PRE-SURGERY) liquid 592 mL  592 mL Oral Once Michael Boston, MD       lactated ringers infusion   Intravenous Continuous Hulan Fray L, MD 10 mL/hr at 02/22/22 0615 New Bag at 02/22/22 0615     No Known Allergies  BP 136/73   Pulse 88   Temp 98 F (36.7 C) (Oral)   Resp 18   Ht '6\' 3"'$  (1.905 m)   Wt 92.1 kg   SpO2 94%   BMI 25.37 kg/m   Labs: Results for orders placed or performed during the hospital encounter of  02/22/22 (from the past 48 hour(s))  Glucose, capillary     Status: Abnormal   Collection Time: 02/22/22  5:56 AM  Result Value Ref Range   Glucose-Capillary 150 (H) 70 - 99 mg/dL    Comment: Glucose reference range applies only to samples taken after fasting for at least 8 hours.    Imaging / Studies: No results found.   Adin Hector, M.D., F.A.C.S. Gastrointestinal and Minimally Invasive Surgery [facility] [address] [place], [place] [address] [phone] Main / Paging  02/22/2022 6:55 AM    Adin Hector

## 2022-02-22 NOTE — Op Note (Signed)
02/22/2022  10:54 AM  PATIENT:  Donald Bender  62 y.o. 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  This assistant was needed for exposure, dissection, suction, tissue approximation, retraction, perception, etc.    ANESTHESIA:     General  Regional TRANSVERSUS ABDOMINIS PLANE (TAP) nerve block for perioperative & postoperative pain control provided with liposomal bupivacaine (Experel) mixed with 0.25% bupivacaine as a Bilateral TAP block x 13m each side at the level of the transverse abdominis & preperitoneal spaces along the flank at the anterior axillary line, from subcostal ridge to iliac crest under laparoscopic guidance   Local field block at port sites & extraction wound  EBL:  Total I/O In: 1600 [I.V.:1500; IV Piggyback:100] Out: 400 [Urine:300; Blood:100]  Delay start of Pharmacological VTE agent (>24hrs) due to surgical blood loss or risk of bleeding:  no  DRAINS: 19 Fr Blake drain goes to the pelvis  SPECIMEN:   RECTOSIGMOID COLON (open end proximal) DISTAL ANASTOMOTIC RING (final distal margin)  DISPOSITION OF SPECIMEN:  PATHOLOGY  COUNTS:  YES  PLAN OF CARE: Admit to inpatient   PATIENT  DISPOSITION:  PACU - hemodynamically stable.  INDICATION:    62year old male with pain phlegmon and perforation strongly suspicious of diverticulitis.  No malignancy by endoscopy.  Episodes of pneumaturia and recurrent UTIs and infection strongly suspicious for colovesical fistula.  Recommendation made for minimally invasive rectosigmoid resection takedown colovesical fistula:  The anatomy & physiology of the digestive tract was discussed.  The pathophysiology of  fistula between the bowel and bladder was discussed.  Natural history risks without surgery was discussed. I worked to give an overview of the disease and the frequent need to have multispecialty involvement.   I feel the risks of no intervention will lead to serious problems that outweigh the operative risks; therefore, I recommended surgery to treat the pathology.  Laparoscopic & open techniques for partial proctocolectomy with bladder repair were discussed.  Possible fecal diversion by ostomy was discussed.  We will work to preserve anal & pelvic floor function without sacrificing cure.  Need for prolonged bladder catheterization was discussed.  Risks such as bleeding, infection, abscess, leak, injury to other organs, need for repair of tissues / organs, recurrence with reoperation, possible ostomy, hernia, heart attack, death, and other risks were discussed.  I noted a good likelihood this will help address the problem.   Goals of post-operative recovery were discussed as well.  We will work to minimize complications.  An educational handout on the pathology was given as well.  Questions were answered.    The patient expresses understanding & wishes to proceed with surgery.  OR FINDINGS:   Patient had significantly inflamed rectosigmoid colon densely adherent to the left posterior dome of the bladder.  No obvious metastatic disease on visceral parietal peritoneum or liver.  It is a 373m  EEA anastomosis ( distal descending colon   connected to proximal/mid rectum junction.)  It rests 12 cm from the anal verge by rigid proctoscopy.  CASE DATA:  Type of patient?: Elective [facility] Private Case  Status of Case? Elective Scheduled  Infection Present At Time Of Surgery (PATOS)?  ABSCESS  Patient had large right direct space inguinal hernia and smaller definite left-sided inguinal hernia as well.  These were left alone since was not safe to repair at the time of colectomy with abscess.  DESCRIPTION:   Informed consent was confirmed.  The patient underwent general anaesthesia without difficulty.  The patient was positioned appropriately.  VTE prevention in place.  Patient underwent cystoscopy with ICG firefly injection into both ureters by Urology.  Please see Dr. Diona Fanti operative note.  The patient was clipped, prepped, & draped in a sterile fashion.  Surgical timeout confirmed our plan.  The patient was positioned in reverse Trendelenburg.  Abdominal entry was gained using Varess technique at the left subcostal ridge on the anterior abdominal wall.  No elevated EtCO2 noted.  Port placed.  Camera inspection revealed no injury.  Extra ports were carefully placed under direct laparoscopic visualization.  Upon entering the abdomen (organ space),we encountered a phlegmon involving the rectosigmoid colon.  Later during dissection an obvious abscess was encountered between the rectosigmoid colon and bladder. .   I reflected the greater omentum and the upper abdomen the small bowel in the upper abdomen.  The patient was carefully positioned.  The [employer] daVinci robot was docked with camera & instruments carefully placed.  The patient had significant phlegmon and appendix densely adherent to the bladder.  Appendix was strongly incorporated and did not feel it could be safely spared.  I therefore transected the mesoappendix.  I mobilized the rectosigmoid colon & elevated it to put the main pedicle on tension.  I scored the base of  peritoneum of the medial side of the mesentery of the elevated left colon from the ligament of Treitz to the mid rectum.   I elevated the sigmoid mesentery and entered into the retro-mesenteric plane. We were able to identify the left ureter and gonadal vessels. We kept those posterior within the retroperitoneum and elevated the left colon mesentery off that. I did isolate the inferior mesenteric artery (IMA) pedicle but did not ligate it yet.  I continued distally and got into the avascular plane posterior to the mesorectum, sparing the nervi ergentes.. This allowed me to help mobilize the rectum as well by freeing the mesorectum off the sacrum.  I stayed away from the right and left ureters.  I kept the lateral vascular pedicles to the rectum intact.  I skeletonized the lymph nodes off the inferior mesenteric artery pedicle.  I went down to its takeoff from the aorta.  I isolated the inferior mesenteric vein off of the ligament of Treitz just cephalad to that as well.  After confirming the left ureter was out of the way, I went ahead and ligated the inferior mesenteric artery pedicle just near its takeoff from the aorta.  I did ligate the inferior mesenteric vein in a similar fashion.  We ensured hemostasis.  I continued medial to lateral dissection to free the left colon mesentery off the retroperitoneum going up towards the splenic flexure to allow good mobility and protect the colon mesentery.  I mobilized the left colon in a lateral to medial fashion off the retroperitoneum and sidewall attachments along the line of Toldt up towards the splenic  flexure to ensure good mobilization of the remaining left colon to reach into the pelvis.  Did partial splenic flexure mobilization to takedown the splenocolic retroperitoneal attachments to get some mobility.  We then focused on mesorectal dissection.  Freed the mesorectum off the presacral plane until I was distal to the concerning region.  Stapled the appendix  taking a healthy cuff of the cecum using a robotic stapler.  The and focused on dissection to free the very dense concrete adhesions of the rectosigmoid colon with appendiceal tip off the bladder.  This took some time.  Had to use focus cautery and sharp scissors.  Took time to make sure vital structures including ureters were spared.  Encountered cluster of posterior abscesses 4 x 3 cm region that were able to aspirate immediately and avoid contamination.  Eventually was able to free the torturous inflamed rectosigmoid colon off the bladder and anterior pelvis.  Freed off peritoneum on the lateral sidewalls as well and transected the mesentery of the lateral pedicles to get distal to the area of concern.    Had to the OR team insufflate the bladder with sterile fluid stain with methylene blue.  Got distention to 300 mL with good bladder distention.  The fistula had been on the left posterior dome of the bladder but no leaking.  No drainage elsewhere.  No evidence of any major fistulous opening remaining, so I did not suture the region since it was extremely thickened and inflamed.    Came around anteriorly such that I had good circumferential mesorectal excision and a good margin distal to the area of concern.  I chose a region at the descending/sigmoid junction that was soft and easily reached down to the rectal stump.  I skeletonized the mesorectum at the proximal/mid rectal junction..  We then chose a region for the proximal margin that would reach well for our planned anastomosis (distal descending colon).  Transected the colon mesentery radially to preserve good collateral and marginal artery blood supply.    To access vascular perfusion of tissues, we asked anesthesia use intravenous  indocyanine green (ICG) with IV flush.  I switched to the NIR fluorescence (Firefly mode) imaging window on the daVinci robot platform.  We were able to see good light green visualization of blood vessels with good  vascular perfusion of small intestine and rectum.  Proximal colon was rather bright as well.  Took some time for the left colon to fill out but eventually it did fill up down to the mid descending colon.  While there was some contrast at the prechosen area of transection it was rather poor.  We decided to transect the mesocolon a little more proximally 10 cm to an area that was better perfused more at the mid descending colon.  I then transected at the proximal/mid rectum distal margin with a robotic stapler.  We created an extraction incision through a small Pfannenstiel incision in the suprapubic region.  Placed a wound protector.  I was able to eviscerate the rectosigmoid and descending colon out the wound.   I clamped the colon proximal to this area using a reusable pursestringer device.  Passed a 2-0 Keith needle. I transected at the descending/sigmoid junction with a scalpel. I got healthy bleeding mucosa.  We sent the rectosigmoid colon specimen off to go to pathology.  We sized the colon orifice.  I chose a 58m EEA anvil stapler system.  I reinforced the prolene pursestring with interrupted silk "belt loop" sutures.  I  placed the anvil to the open end of the proximal remaining colon and closed around it using the pursestring.    We did copious irrigation with crystalloid solution.  Hemostasis was good.  The distal end of the remaining colon easily reached down to the rectal stump, therefore, further splenic flexure mobilization was not needed.      Dr Marcello Moores scrubbed down and did gentle anal dilation and advanced the EEA stapler up the rectal stump. The spike was brought out at the provimal end of the rectal stump under direct visualization.  I  attached the anvil of the proximal colon the spike of the stapler. Anvil was tightened down and held clamped for 60 seconds.  Orientation was confirmed such that there is no twisting of the colon nor small bowel underneath the mesenteric defect. No concerning  tension.  The EEA stapler was fired and held clamped for 30 seconds. The stapler was released & removed. Blue stitch is in the proximal ring.  Care was taken to ensure no other structures were incorporated within this either.  We noted 2 excellent anastomotic rings.   The colon proximal to the anastomosis was then gently occluded. The pelvis was filled with sterile irrigation.  Dr Marcello Moores  did rigid proctoscopy noted the anastomosis was at 12 cm from the anal verge consistent with the proximal rectum.  There was a negative air leak test. There was no tension of mesentery or bowel at the anastomosis.   Tissues looked viable.  Ureters & bowel uninjured.  The anastomosis looked healthy.  Given the small abscess and fair tissue quality and the smoker decided to place a [facility] surgical drain.  Dr. Marcello Moores agreed.  Form the patient had plenty of greater omentum that could reach down the pelvis.  And that was laid between the anastomosis and the bladder for further protection.    Endoluminal gas was evacuated.  Ports & wound protector removed.  We changed gloves & redraped the patient per colon SSI prevention protocol.  We aspirated the sterile irrigation.  Hemostasis was good.  Sterile unused instruments were used from this point.  I closed the skin at the port sites using Monocryl stitch and sterile dressing.  We assured hemostasis and the former ostomy wound.  Wound irrigated.  I closed the posterior rectus fascia with 0 Vicryl suture.  Anterior rectus fascia was closed using #1 PDS transversely.  Sterile dressing placed.   Patient is being extubated go to recovery room. I had discussed postop care with the patient in detail the office & in the holding area. Instructions are written. I discussed operative findings, updated the patient's status, discussed probable steps to recovery, and gave postoperative recommendations to the patient's significant other, Cecile Sheerer .  Recommendations were made.  Questions were  answered.  She expressed understanding & appreciation.  Adin Hector, M.D., F.A.C.S. Gastrointestinal and Minimally Invasive Surgery [facility] [address], [place], [place] [address] [phone] Main / Paging

## 2022-02-22 NOTE — H&P (Signed)
02/22/2022   REFERRING PHYSICIAN: Hollace Kinnier, *  Patient Care Team: Arnoldo Morale, MD as PCP - General (Family Medicine) Johney Maine, Adrian Saran, MD as Consulting Provider (Colon and Rectal Surgery) Gerrit Heck, DO (Gastroenterology)  PROVIDER: Hollace Kinnier, MD  [facility] MRN: [id number] DOB: Apr 07, 1960  SUBJECTIVE   Chief Complaint: Diverticulitis   Donald Bender is a 62 y.o. 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 Heavy smoker. [place] with no insurance was admitted in June 2023 with diverticulitis with microperforation. Followed up with gastroenterology. Colonoscopy done. Lipoma transverse colon noted. A few adenomatous polyps removed. Some thickening and persistent irritation in sigmoid colon consistent with diverticulitis. Patient had recurrent symptoms and was admitted in December 01, 2021. Now had evidence of colovesical fistula with some ascending pyelonephritis.. Stabilized and discharged 4 days later. Recommendation for follow-up. [facility] appointment was for 12/12/2021. Looks like patient rescheduled it to today.  Patient is here today with his wife. Patient is established with primary care who is trying to help manage his diabetes hypertension and polycythemia. Initially his diabetes poorly controlled but this year he is on medication. He has cut back on his smoking to less than a pack a day. He had complained of gout on his colchicine. Was given prednisone 20 mg x 5 days a few weeks ago. He has an obvious large mass on his nose. He notes he has had that for a decade gradually getting bigger. Some bleeding and discomfort but breathes normally fine. He claims he can walk a few blocks before he feels tired and stops. No chest pain or pressure necessarily. No prior cardiac or pulmonary issues that he is aware of. He  has noticed a couple moments of passing gas "spitting" when he tries to urinate. No major flecks. No hematuria. He has noticed he is gotten more constipated. He has been taking MiraLAX and that has helped. No sleep apnea.  Medical History:  Past Medical History:  Diagnosis Date  Diabetes mellitus without complication (CMS-HCC)  Hypertension   Patient Active Problem List  Diagnosis  Colovesical fistula  Diverticulitis of colon with perforation  Tobacco abuse  History of adenomatous polyp of colon  Non-recurrent bilateral inguinal hernia without obstruction or gangrene  Umbilical hernia without obstruction and without gangrene  Nasal mass  Acute idiopathic gout  History of recent steroid use  Diastasis recti   History reviewed. No pertinent surgical history.   No Known Allergies  Current Outpatient Medications on File Prior to Visit  Medication Sig Dispense Refill  allopurinoL (ZYLOPRIM) 300 MG tablet Take 1 tablet by mouth once daily  atorvastatin (LIPITOR) 40 MG tablet Take by mouth  colchicine (COLCRYS) 0.6 mg tablet TAKE 2 TABLETS BY MOUTH AT THE ONSET OF A GOUT ATTACK, MAY REPEAT 1 TABLET IN 2 HOURS IF SYMPTOMS PERSIST.  empagliflozin (JARDIANCE) 25 mg tablet Take by mouth  losartan (COZAAR) 100 MG tablet Take 100 mg by mouth once daily  magnesium gluconate (MAGONATE) 27.5 mg magne- sium (500 mg) tablet Take by mouth  metFORMIN (GLUCOPHAGE) 1000 MG tablet Take by mouth  predniSONE (DELTASONE) 20 MG tablet Take 20 mg by mouth daily with breakfast   No current facility-administered medications on file prior to visit.   History reviewed. No pertinent family history.   Social History   Tobacco Use  Smoking Status  Every Day  Types: Cigarettes  Smokeless Tobacco Not on file    Social History   Socioeconomic History  Marital status: Single  Tobacco Use  Smoking status: Every Day  Types: Cigarettes  Substance and Sexual Activity  Alcohol use: Defer  Drug use: Not  Currently   ############################################################  Review of Systems: A complete review of systems (ROS) was obtained from the patient.  We have reviewed this information and discussed as appropriate with the patient.  See HPI as well for other pertinent ROS.  Constitutional: No fevers, chills, sweats. Weight stable Eyes: No vision changes, No discharge HENT: No sore throats, nasal drainage Lymph: No neck swelling, No bruising easily Pulmonary: No cough, productive sputum CV: No orthopnea, PND . No exertional chest/neck/shoulder/arm pain. Patient can walk 1/4 mile gradually.   GI: No personal nor family history of GI/colon cancer, inflammatory bowel disease, irritable bowel syndrome, allergy such as Celiac Sprue, dietary/dairy problems, colitis, ulcers nor gastritis. No recent sick contacts/gastroenteritis. No travel outside the country. No changes in diet.  Renal: No UTIs, No hematuria Genital: No drainage, bleeding, masses Musculoskeletal: No severe joint pain. Good ROM major joints Skin: No sores or lesions Heme/Lymph: No easy bleeding. No swollen lymph nodes Neuro: No active seizures. No facial droop Psych: No hallucinations. No agitation  OBJECTIVE   Vitals:  01/01/22 1441  BP: 136/82  Pulse: 101  Weight: 93.6 kg (206 lb 6.4 oz)  Height: 190.5 cm ('6\' 3"'$ )   Body mass index is 25.8 kg/m.  PHYSICAL EXAM:  Constitutional: Not cachectic. Hygeine adequate. Smells of tobacco. Vitals signs as above.  Eyes: No glasses. Vision adequate,Pupils reactive, normal extraocular movements. Sclera nonicteric Neuro: CN II-XII intact. No major focal sensory defects. No major motor deficits. Lymph: No head/neck/groin lymphadenopathy Psych: No severe agitation. No severe anxiety. Judgment & insight Adequate, Oriented x4, HENT: Large lobulated verrucous non-ulcerative mass covering most of his nose. Normocephalic, Mucus membranes moist. No thrush. Hearing:  adequate Neck: Supple, No tracheal deviation. No obvious thyromegaly Chest: No pain to chest wall compression. Good respiratory excursion. No audible wheezing CV: Pulses intact. regular. No major extremity edema Ext: No obvious deformity or contracture. Edema: Not present. No cyanosis Skin: No major subcutaneous nodules. Warm and dry Musculoskeletal: Severe joint rigidity not present. No obvious clubbing. No digital petechiae. Mobility: no assist device moving easily without restrictions  Abdomen: Flat Soft. Nondistended. Nontender. Hernia: Present at: Umbilicus, size [id number]. Diastasis recti: Mild supraumbilical midline. No hepatomegaly. No splenomegaly.  Genital/Pelvic: Inguinal hernia: Right greater than left inguinal hernias. Soft reducible.. Inguinal lymph nodes: .   Rectal: (Deferred)    ###################################################################  Labs, Imaging and Diagnostic Testing:  Located in 'Care Everywhere' section of Epic EMR chart  PRIOR [facility] CLINIC NOTES:  Not applicable  SURGERY NOTES:  Not applicable  PATHOLOGY:  Located in [place]' section of Epic EMR chart  Assessment and Plan:  DIAGNOSES:  Diagnoses and all orders for this visit:  Colovesical fistula  Diverticulitis of colon with perforation  Tobacco abuse  History of adenomatous polyp of colon  Non-recurrent bilateral inguinal hernia without obstruction or gangrene  Umbilical hernia without obstruction and without gangrene  Diastasis recti  Nasal mass  Acute idiopathic gout, unspecified site  History of recent steroid use  Other orders - polyethylene glycol (MIRALAX) powder; Take 233.75 g by mouth once for 1 dose Take according to your procedure prep instructions. - bisacodyL (DULCOLAX) 5 mg EC tablet; Take 4 tablets (20 mg total) by mouth once  daily as needed for Constipation for up to 1 dose - metroNIDAZOLE (FLAGYL) 500 MG tablet; Take 2 tablets (1,000 mg total) by  mouth 3 (three) times daily for 3 doses SEE BOWEL PREP INSTRUCTIONS: Take 2 tablets at 2pm, 3pm, and 10pm the day prior to your colon operation. - neomycin 500 mg tablet; Take 2 tablets (1,000 mg total) by mouth 3 (three) times daily for 3 doses SEE BOWEL PREP INSTRUCTIONS: Take 2 tablets at 2pm, 3pm, and 10pm the day prior to your colon operation.    ASSESSMENT/PLAN  Pleasant gentleman with evidence of recurrent diverticulitis and evidence of colovesical fistula by pneumaturia and gas in bladder. Prior colonoscopy September 2023 with some inflammation and stricturing but no mass or tumor. Few adenomatous polyps removed.  Standard of care is segmental colonic resection. Reasonable candidate for robotic rectosigmoid resection. Urology to firefly ICG on bladder and ureters to see and protect them. Use three-way Foley to pull up the bladder. He had large pocket of gas which makes him wonder if he has a persistent fistula that can require suture repair. We will see.  The anatomy & physiology of the digestive tract was discussed. The pathophysiology of fistula between the bowel and bladder was discussed. Natural history risks without surgery was discussed. I worked to give an overview of the disease and the frequent need to have multispecialty involvement.   I feel the risks of no intervention will lead to serious problems that outweigh the operative risks; therefore, I recommended surgery to treat the pathology. Laparoscopic & open techniques for partial proctocolectomy with bladder repair were discussed. Possible fecal diversion by ostomy was discussed. We will work to preserve anal & pelvic floor function without sacrificing cure. Need for prolonged bladder catheterization was discussed.  Risks such as bleeding, infection, abscess, leak, injury to other organs, need for repair of tissues / organs, recurrence with reoperation, possible ostomy, hernia, heart attack, death, and other risks were discussed.  I noted a good likelihood this will help address the problem. Goals of post-operative recovery were discussed as well. We will work to minimize complications. An educational handout on the pathology was given as well. Questions were answered.   The patient expresses understanding & wishes to proceed with surgery.  It is fair exercise tolerance with heavy smoking, I recommended obtaining preoperative cardiac clearance. That has been done - he is cleared.   STOP SMOKING! We talked to the patient about the dangers of smoking. We stressed that tobacco use dramatically increases the risk of peri-operative complications such as infection, tissue necrosis leaving to problems with incision/wound and organ healing, hernia, chronic pain, heart attack, stroke, DVT, pulmonary embolism, and death. We noted there are programs in our community to help stop smoking. Information was available.  He has bilateral inguinal hernias. They are minimally sensitive and reducible. He could benefit from surgery to fix those along with his umbilical hernia. Ideally would do mesh repair given his diastases recti even around his bellybutton. He would need to completely quit smoking to minimize risk of recurrence and infection. Certainly he needs to get over the colovesical fistula and diverticulitis issues first. It is not a pressing issue and I would not do at the same time of his colectomy and hold off.  The large verrucous mass on his nose that slowly getting bigger. Did not seem like a strongly invasive cancer but very suspicious. At some point he would benefit from seeing dermatology to discuss evaluation and removal of that mass.  However not as pressing his colovesical fistula.  He did not want an ENT referral   Adin Hector, MD, FACS, MASCRS Esophageal, Gastrointestinal & Colorectal Surgery Robotic and Minimally Invasive Surgery  [facility] [address] [place], [place] [address] [phone] Fax [phone] Main  CONTACT INFORMATION:  Weekday (9AM-5PM): Call [facility] main office at [phone]  Weeknight (5PM-9AM) or Weekend/Holiday: Check [email or web address] (password " [email or web address]") for General Surgery [facility] coverage  (Please, do not use SecureChat as it is not reliable communication to reach operating surgeons for immediate patient care given surgeries/outpatient duties/clinic/cross-coverage/off post-call which would lead to a delay in care.  Epic staff messaging available for outptient concerns, but may not be answered for 48 hours or more).

## 2022-02-22 NOTE — Transfer of Care (Signed)
Immediate Anesthesia Transfer of Care Note  Patient: Donald Bender  Procedure(s) Performed: ROBOTIC RESECTION OF COLON RECTOSIGMOID, take down of colovesical fistula, appendectomy, profusion with ICG RIGID PROCTOSCOPY CYSTOSCOPY with FIREFLY INJECTION  Patient Location: PACU  Anesthesia Type:General  Level of Consciousness: sedated and responds to stimulation  Airway & Oxygen Therapy: Patient Spontanous Breathing and Patient connected to face mask oxygen  Post-op Assessment: Report given to RN and Post -op Vital signs reviewed and stable  Post vital signs: Reviewed and stable  Last Vitals:  Vitals Value Taken Time  BP    Temp    Pulse    Resp    SpO2      Last Pain:  Vitals:   02/22/22 0550  TempSrc: Oral         Complications: No notable events documented.

## 2022-02-22 NOTE — Anesthesia Postprocedure Evaluation (Signed)
Anesthesia Post Note  Patient: ANTONIO CRESWELL  Procedure(s) Performed: ROBOTIC LOW ANTERIOR RECTOSIGMOID RESECTION, takedown of colovesical fistula, appendectomy, Intraoperative assessment of tissue vascular profusion with ICG, transversus abdominis plane bilateral RIGID PROCTOSCOPY CYSTOSCOPY with FIREFLY INJECTION     Patient location during evaluation: PACU Anesthesia Type: General Level of consciousness: awake and alert Pain management: pain level controlled Vital Signs Assessment: post-procedure vital signs reviewed and stable Respiratory status: spontaneous breathing, nonlabored ventilation, respiratory function stable and patient connected to nasal cannula oxygen Cardiovascular status: blood pressure returned to baseline and stable Postop Assessment: no apparent nausea or vomiting Anesthetic complications: no  No notable events documented.  Last Vitals:  Vitals:   02/22/22 1215 02/22/22 1230  BP: (!) 130/56 127/63  Pulse: 82 80  Resp: (!) 24 17  Temp:    SpO2: 91% 91%    Last Pain:  Vitals:   02/22/22 1100  TempSrc:   PainSc: Asleep                 Kennet Mccort S

## 2022-02-22 NOTE — Anesthesia Preprocedure Evaluation (Addendum)
Anesthesia Evaluation  Patient identified by MRN, date of birth, ID band Patient awake    Reviewed: Allergy & Precautions, H&P , NPO status , Patient's Chart, lab work & pertinent test results  Airway Mallampati: II  TM Distance: >3 FB Neck ROM: Full    Dental no notable dental hx.    Pulmonary Current Smoker and Patient abstained from smoking.   Pulmonary exam normal breath sounds clear to auscultation       Cardiovascular hypertension, Pt. on medications Normal cardiovascular exam Rhythm:Regular Rate:Normal     Neuro/Psych negative neurological ROS  negative psych ROS   GI/Hepatic negative GI ROS, Neg liver ROS,,,  Endo/Other  diabetes    Renal/GU negative Renal ROS  negative genitourinary   Musculoskeletal negative musculoskeletal ROS (+)    Abdominal   Peds negative pediatric ROS (+)  Hematology negative hematology ROS (+)   Anesthesia Other Findings   Reproductive/Obstetrics negative OB ROS                             Anesthesia Physical Anesthesia Plan  ASA: 2  Anesthesia Plan: General   Post-op Pain Management: Tylenol PO (pre-op)*, Gabapentin PO (pre-op)*, Lidocaine infusion* and Ketamine IV*   Induction: Intravenous  PONV Risk Score and Plan: 1 and Ondansetron, Dexamethasone and Treatment may vary due to age or medical condition  Airway Management Planned: Oral ETT  Additional Equipment:   Intra-op Plan:   Post-operative Plan: Extubation in OR  Informed Consent: I have reviewed the patients History and Physical, chart, labs and discussed the procedure including the risks, benefits and alternatives for the proposed anesthesia with the patient or authorized representative who has indicated his/her understanding and acceptance.     Dental advisory given  Plan Discussed with: CRNA and Surgeon  Anesthesia Plan Comments:         Anesthesia Quick  Evaluation

## 2022-02-22 NOTE — Interval H&P Note (Signed)
History and Physical Interval Note:  02/22/2022 7:18 AM  Donald Bender  has presented today for surgery, with the diagnosis of COLOVESICAL FISTULA.  The various methods of treatment have been discussed with the patient and family. After consideration of risks, benefits and other options for treatment, the patient has consented to  Procedure(s): ROBOTIC RESECTION OF COLON RECTOSIGMOID, POSSIBLE BLADDER REPAIR (N/A) POSSIBLE OSTOMY (N/A) RIGID PROCTOSCOPY (N/A) CYSTOSCOPY with FIREFLY INJECTION (N/A) as a surgical intervention.  The patient's history has been reviewed, patient examined, no change in status, stable for surgery.  I have reviewed the patient's chart and labs.  Questions were answered to the patient's satisfaction.     Lillette Boxer Schuyler Behan

## 2022-02-22 NOTE — Op Note (Signed)
Preoperative diagnosis: Colovesical fistula  Postoperative diagnosis: Same  Principal procedure: Cystoscopy, retrograde injection of firefly and bilateral ureters  Surgeon: Charisse Wendell  Anesthesia: General endotracheal  Complications: None  Estimated blood loss: None  Indications: 62 year old with documented colovesical fistula.  He is scheduled for surgical resection today.  Dr. Johney Maine requested placement of firefly bilaterally for more easy identification of the ureters.  Findings: Prostate nonobstructive.  Ureteral orifices were normal in their location and configuration.  Inflammatory area in the posterior bladder on the left consistent with fistula.  Description of procedure: Patient properly identified in the holding area.  He is taken to the operating room where general endotracheal anesthetic was administered.  He is placed in the dorsolithotomy position.  Genitalia and perineum were prepped, draped, proper timeout performed.  21 [place] panendoscope advanced under direct vision.  Above-mentioned findings noted.  Using 6 [place] open-ended catheter, approximately 7 mL of firefly was injected retrograde in each ureter.  This was without incident.  After this, the scope removed.  A 16 French Foley catheter was placed, balloon inflated with 10 cc of water and this was connected to dependent drainage.  At this point, the urologic part of the procedure was completed.

## 2022-02-23 ENCOUNTER — Encounter (HOSPITAL_COMMUNITY): Payer: Self-pay | Admitting: Surgery

## 2022-02-23 LAB — BASIC METABOLIC PANEL
Anion gap: 9 (ref 5–15)
BUN: 20 mg/dL (ref 8–23)
CO2: 23 mmol/L (ref 22–32)
Calcium: 8.8 mg/dL — ABNORMAL LOW (ref 8.9–10.3)
Chloride: 105 mmol/L (ref 98–111)
Creatinine, Ser: 0.74 mg/dL (ref 0.61–1.24)
GFR, Estimated: >60 mL/min (ref >60)
Glucose, Bld: 114 mg/dL — ABNORMAL HIGH (ref 70–99)
Potassium: 4.1 mmol/L (ref 3.5–5.1)
Sodium: 137 mmol/L (ref 135–145)

## 2022-02-23 LAB — CBC
HCT: 38 % — ABNORMAL LOW (ref 39.0–52.0)
Hemoglobin: 12.2 g/dL — ABNORMAL LOW (ref 13.0–17.0)
MCH: 28.8 pg (ref 26.0–34.0)
MCHC: 32.1 g/dL (ref 30.0–36.0)
MCV: 89.8 fL (ref 80.0–100.0)
Platelets: 176 10*3/uL (ref 150–400)
RBC: 4.23 MIL/uL (ref 4.22–5.81)
RDW: 16.3 % — ABNORMAL HIGH (ref 11.5–15.5)
WBC: 10.9 10*3/uL — ABNORMAL HIGH (ref 4.0–10.5)
nRBC: 0 % (ref 0.0–0.2)

## 2022-02-23 LAB — MAGNESIUM: Magnesium: 1.7 mg/dL (ref 1.7–2.4)

## 2022-02-23 NOTE — Progress Notes (Signed)
Donald Bender [id number] 05/15/1960  CARE TEAM:  PCP: Charlott Rakes, MD  Outpatient Care Team: Patient Care Team: Charlott Rakes, MD as PCP - General (Family Medicine) Michael Boston, MD as Consulting Physician (General Surgery) Lavena Bullion, DO as Consulting Physician (Gastroenterology)  Inpatient Treatment Team: Treatment Team: Attending Provider: Michael Boston, MD; Registered Nurse: Lucia Bitter, RN; Utilization Review: Lacretia Leigh, RN; Charge Nurse: Steward Ros, RN   Problem List:   Principal Problem:   Colovesical fistula Active Problems:   Rhinophyma   Essential hypertension   Type 2 diabetes mellitus without complication, without long-term current use of insulin ([place])   Hypertriglyceridemia   Diverticulitis of large intestine with perforation   Tobacco abuse   Diverticulitis of colon with perforation   Hypertension   History of adenomatous polyp of colon   History of recent steroid use   Nasal mass   1 Day Post-Op  02/22/2022  POST-OPERATIVE DIAGNOSIS:   COLOVESICAL FISTULA WITH ABSCESS BILATERAL INGUINAL HERNIAS   PROCEDURE:   -ROBOTIC LOW ANTERIOR RECTOSIGMOID RESECTION  -TAKEDOWN OF COLOVESICAL FISTULA -APPENDECTOMY (en bloc) -INTRAOPERATIVE ASSESSMENT OF TISSUE VASCULAR PERFUSION USING ICG (indocyanine green) IMMUNOFLUORESCENCE -TRANSVERSUS ABDOMINIS PLANE (TAP) BLOCK - BILATERAL -RIGID PROCTOSCOPY   SURGEON:  Adin Hector, MD  OR FINDINGS:    Patient had significantly inflamed rectosigmoid colon densely adherent to the left posterior dome of the bladder.   No obvious metastatic disease on visceral parietal peritoneum or liver.   It is a 69m EEA anastomosis ( distal descending colon  connected to proximal/mid rectum junction.)  It rests 12 cm from the anal verge by rigid proctoscopy.  Assessment  [facility]overing relatively well so far  [facility]  Plan:  -Follow pathology.  Suspect diverticulitis -ERAS  protocol -Advance diet gradually. -Okay to remove Foley -Most likely remove surgical drain on day of discharge. -Diabetic control with sliding scale -Pretension controlled. -VTE prophylaxis- SCDs, etc -mobilize as tolerated to help recovery  Disposition:  Disposition:  The patient is from: Home  Anticipate discharge to:  Home  Anticipated Date of Discharge is:  January 27,2024    Barriers to discharge:  Pending Clinical improvement (more likely than not)  Patient currently is NOT MEDICALLY STABLE for discharge from the hospital from a surgery standpoint.      I reviewed nursing notes, last 24 h vitals and pain scores, last 48 h intake and output, last 24 h labs and trends, and last 24 h imaging results. I have reviewed this patient's available data, including medical history, events of note, test results, etc as part of my evaluation.  A significant portion of that time was spent in counseling.  Care during the described time interval was provided by me.  This care required moderate level of medical decision making.  02/23/2022    Subjective: (Chief complaint)  Feeling well.  Significant other in room.  Tolerating liquids.  Denies much pain.  Objective:  Vital signs:  Vitals:   02/22/22 1734 02/22/22 2158 02/23/22 0130 02/23/22 0544  BP: (!) 140/70 115/64 137/66 132/74  Pulse: 83 94 87 81  Resp: '17 17 17 17  '$ Temp: 98.3 F (36.8 C) 98.4 F (36.9 C) 98.1 F (36.7 C) 97.8 F (36.6 C)  TempSrc: Oral Oral Oral Oral  SpO2: 100% 94% 90% 93%  Weight:      Height:        Last BM Date : 02/22/22  Intake/Output   Yesterday:  01/25  0701 - 01/26 0700 In: 3299 [P.O.:740; I.V.:1500; IV Piggyback:100] Out: 2760 [Urine:2350; Drains:310; Blood:100] This shift:  No intake/output data recorded.  Bowel function:  Flatus: YES  BM:  No  Drain: Serosanguinous   Physical Exam:  General: Pt awake/alert in no acute distress Eyes: PERRL, normal EOM.  Sclera  clear.  No icterus Neuro: CN II-XII intact w/o focal sensory/motor deficits. Lymph: No head/neck/groin lymphadenopathy Psych:  No delerium/psychosis/paranoia.  Oriented x 4 HENT: Normocephalic, Mucus membranes moist.  No thrush.  Large fungating mass on nose stable. Neck: Supple, No tracheal deviation.  No obvious thyromegaly Chest: No pain to chest wall compression.  Good respiratory excursion.  No audible wheezing CV:  Pulses intact.  Regular rhythm.  No major extremity edema MS: Normal AROM mjr joints.  No obvious deformity  Abdomen: Soft.  Nondistended.  Mildly tender at incisions only.  No evidence of peritonitis.  No incarcerated hernias.  Ext:   No deformity.  No mjr edema.  No cyanosis Skin: No petechiae / purpurea.  No major sores.  Warm and dry    Results:   Cultures: No results found for this or any previous visit (from the past 720 hour(s)).  Labs: Results for orders placed or performed during the hospital encounter of 02/22/22 (from the past 48 hour(s))  Glucose, capillary     Status: Abnormal   Collection Time: 02/22/22  5:56 AM  Result Value Ref Range   Glucose-Capillary 150 (H) 70 - 99 mg/dL    Comment: Glucose reference range applies only to samples taken after fasting for at least 8 hours.  Type and screen [place]     Status: None   Collection Time: 02/22/22  6:15 AM  Result Value Ref Range   ABO/RH(D) AB NEG    Antibody Screen NEG    Sample Expiration      02/25/2022,2359 Performed at [facility], [place] [address] [place], [place] [address]   ABO/Rh     Status: None   Collection Time: 02/22/22  6:17 AM  Result Value Ref Range   ABO/RH(D)      AB NEG Performed at [facility] [address] [place], [place] [address]   Glucose, capillary     Status: Abnormal   Collection Time: 02/22/22 11:09 AM  Result Value Ref Range   Glucose-Capillary 156 (H) 70 - 99 mg/dL    Comment: Glucose reference  range applies only to samples taken after fasting for at least 8 hours.  Basic metabolic panel     Status: Abnormal   Collection Time: 02/23/22  4:40 AM  Result Value Ref Range   Sodium 137 135 - 145 mmol/L   Potassium 4.1 3.5 - 5.1 mmol/L   Chloride 105 98 - 111 mmol/L   CO2 23 22 - 32 mmol/L   Glucose, Bld 114 (H) 70 - 99 mg/dL    Comment: Glucose reference range applies only to samples taken after fasting for at least 8 hours.   BUN 20 8 - 23 mg/dL   Creatinine, Ser 0.74 0.61 - 1.24 mg/dL   Calcium 8.8 (L) 8.9 - 10.3 mg/dL   GFR, Estimated >60 >60 mL/min    Comment: (NOTE) Calculated using the CKD-EPI Creatinine Equation (2021)    Anion gap 9 5 - 15    Comment: Performed at [facility], [place] [address] [place], [place] [address]  CBC     Status: Abnormal   Collection Time: 02/23/22  4:40 AM  Result Value Ref Range   WBC 10.9 (H) 4.0 - 10.5 K/uL   RBC 4.23 4.22 - 5.81 MIL/uL   Hemoglobin 12.2 (L) 13.0 - 17.0 g/dL   HCT 38.0 (L) 39.0 - 52.0 %   MCV 89.8 80.0 - 100.0 fL   MCH 28.8 26.0 - 34.0 pg   MCHC 32.1 30.0 - 36.0 g/dL   RDW 16.3 (H) 11.5 - 15.5 %   Platelets 176 150 - 400 K/uL   nRBC 0.0 0.0 - 0.2 %    Comment: Performed at [facility], [place] [address], [place], [place] [address]  Magnesium     Status: None   Collection Time: 02/23/22  4:40 AM  Result Value Ref Range   Magnesium 1.7 1.7 - 2.4 mg/dL    Comment: Performed at [facility], [place] [address] [place], [place] [address]    Imaging / Studies: No results found.  Medications / Allergies: per chart  Antibiotics: Anti-infectives (From admission, onward)    Start     Dose/Rate Route Frequency Ordered Stop   02/22/22 2200  cefoTEtan (CEFOTAN) 2 g in sodium chloride 0.9 % 100 mL IVPB        2 g 200 mL/hr over 30 Minutes Intravenous Every 12 hours 02/22/22 1528 02/22/22 2351   02/22/22 1400  neomycin (MYCIFRADIN) tablet 1,000 mg  Status:  Discontinued        See Hyperspace for full Linked Orders Report.   1,000 mg Oral 3 times per day 02/22/22 0537 02/22/22 0540   02/22/22 1400  metroNIDAZOLE (FLAGYL) tablet 1,000 mg  Status:  Discontinued       See Hyperspace for full Linked Orders Report.   1,000 mg Oral 3 times per day 02/22/22 0537 02/22/22 0540   02/22/22 0600  cefoTEtan (CEFOTAN) 2 g in sodium chloride 0.9 % 100 mL IVPB        2 g 200 mL/hr over 30 Minutes Intravenous On call to O.R. 02/22/22 0537 02/22/22 0745         Note: Portions of this report may have been transcribed using voice recognition software. Every effort was made to ensure accuracy; however, inadvertent computerized transcription errors may be present.   Any transcriptional errors that result from this process are unintentional.    Adin Hector, MD, FACS, MASCRS Esophageal, Gastrointestinal & Colorectal Surgery Robotic and Minimally Invasive Surgery  [facility]. [address] [place], [place] [address] [phone] Fax [phone] Main  CONTACT INFORMATION:  Weekday (9AM-5PM): Call [facility] main office at [phone]  Weeknight (5PM-9AM) or Weekend/Holiday: Check [email or web address] (password " [email or web address]") for General Surgery [facility] coverage  (Please, do not use SecureChat as it is not reliable communication to reach operating surgeons for immediate patient care given surgeries/outpatient duties/clinic/cross-coverage/off post-call which would lead to a delay in care.  Epic staff messaging available for outptient concerns, but may not be answered for 48 hours or more).     02/23/2022  7:46 AM

## 2022-02-24 MED ORDER — TRAMADOL HCL 50 MG PO TABS: 50.0000 mg | ORAL_TABLET | Freq: Four times a day (QID) | ORAL | 0 refills | Status: DC | PRN

## 2022-02-24 NOTE — Plan of Care (Signed)
Patient ready for discharge. No signs of acute distress noted. JP drain d/cd without any problem. All surgical incisions clean clean and dry with all dressings removed. Discharge instruction given and patient verbalized understanding. Personal belongings taken with patient accompanied by spouse.

## 2022-02-24 NOTE — Discharge Summary (Signed)
Physician Discharge Summary  Patient ID: Donald Bender MRN: [id number] DOB/AGE: 1960/04/14 62 y.o.  Admit date: 02/22/2022 Discharge date: 02/24/2022  Admission Diagnoses: Colovesical fistula Discharge Diagnoses:  Principal Problem:   Colovesical fistula Active Problems:   Essential hypertension   Type 2 diabetes mellitus without complication, without long-term current use of insulin ([facility])   Hypertriglyceridemia   Diverticulitis of large intestine with perforation   Tobacco abuse   Diverticulitis of colon with perforation   Hypertension   History of adenomatous polyp of colon   History of recent steroid use   Nasal mass   Rhinophyma   Discharged Condition: good  Hospital Course: Patient was admitted to the med surg floor after surgery.  Diet was advanced as tolerated.  Patient began to have bowel function on postop day 1.  By postop day 2, he was tolerating a solid diet and pain was controlled with oral medications.  He was urinating without difficulty and ambulating without assistance.  Patient was felt to be in stable condition for discharge to home.   Consults: None  Significant Diagnostic Studies: labs: cbc, bmet  Treatments: analgesia: acetaminophen and surgery: Robotic LAR  Discharge Exam: Blood pressure 118/72, pulse 89, temperature 97.8 F (36.6 C), temperature source Oral, resp. rate 16, height '6\' 3"'$  (1.905 m), weight 92.8 kg, SpO2 92 %. General appearance: alert and cooperative GI: normal findings: soft, non-tender Incision/Wound: Clean, dry, intact  Disposition: Discharge disposition: 01-Home or Self Care       Discharge Instructions     Call MD for:   Complete by: As directed    FEVER > 101.5 F  (temperatures < 101.5 F are not significant)   Call MD for:  extreme fatigue   Complete by: As directed    Call MD for:  persistant dizziness or light-headedness   Complete by: As directed    Call MD for:  persistant nausea and vomiting   Complete by: As  directed    Call MD for:  redness, tenderness, or signs of infection (pain, swelling, redness, odor or green/yellow discharge around incision site)   Complete by: As directed    Call MD for:  severe uncontrolled pain   Complete by: As directed    Diet - low sodium heart healthy   Complete by: As directed    Start with a bland diet such as soups, liquids, starchy foods, low fat foods, etc. the first few days at home. Gradually advance to a solid, low-fat, high fiber diet by the end of the first week at home.   Add a fiber supplement to your diet (Metamucil, etc) If you feel full, bloated, or constipated, stay on a full liquid or pureed/blenderized diet for a few days until you feel better and are no longer constipated.   Discharge instructions   Complete by: As directed    See Discharge Instructions If you are not getting better after two weeks or are noticing you are getting worse, contact our office [phone] for further advice.  We may need to adjust your medications, re-evaluate you in the office, send you to the emergency room, or see what other things we can do to help. The clinic staff is available to answer your questions during regular business hours (8:30am-5pm).  Please don't hesitate to call and ask to speak to one of our nurses for clinical concerns.    A surgeon from [facility] is always on call at the hospitals 24 hours/day If you have a  medical emergency, go to the nearest emergency room or call 911.   Discharge wound care:   Complete by: As directed    It is good for closed incisions and even open wounds to be washed every day.  Shower every day.  Short baths are fine.  Wash the incisions and wounds clean with soap & water.    You may leave closed incisions open to air if it is dry.   You may cover the incision with clean gauze & replace it after your daily shower for comfort.  TEGADERM:  You have clear gauze band-aid dressings over your closed incision(s).   Remove the dressings 3 days after surgery.   Driving Restrictions   Complete by: As directed    You may drive when: - you are no longer taking narcotic prescription pain medication - you can comfortably wear a seatbelt - you can safely make sudden turns/stops without pain.   Increase activity slowly   Complete by: As directed    Start light daily activities --- self-care, walking, climbing stairs- beginning the day after surgery.  Gradually increase activities as tolerated.  Control your pain to be active.  Stop when you are tired.  Ideally, walk several times a day, eventually an hour a day.   Most people are back to most day-to-day activities in a few weeks.  It takes 4-6 weeks to get back to unrestricted, intense activity. If you can walk 30 minutes without difficulty, it is safe to try more intense activity such as jogging, treadmill, bicycling, low-impact aerobics, swimming, etc. Save the most intensive and strenuous activity for last (Usually 4-8 weeks after surgery) such as sit-ups, heavy lifting, contact sports, etc.  Refrain from any intense heavy lifting or straining until you are off narcotics for pain control.  You will have off days, but things should improve week-by-week. DO NOT PUSH THROUGH PAIN.  Let pain be your guide: If it hurts to do something, don't do it.   Lifting restrictions   Complete by: As directed    If you can walk 30 minutes without difficulty, it is safe to try more intense activity such as jogging, treadmill, bicycling, low-impact aerobics, swimming, etc. Save the most intensive and strenuous activity for last (Usually 4-8 weeks after surgery) such as sit-ups, heavy lifting, contact sports, etc.   Refrain from any intense heavy lifting or straining until you are off narcotics for pain control.  You will have off days, but things should improve week-by-week. DO NOT PUSH THROUGH PAIN.  Let pain be your guide: If it hurts to do something, don't do it.  Pain is your  body warning you to avoid that activity for another week until the pain goes down.   May shower / Bathe   Complete by: As directed    May walk up steps   Complete by: As directed    Remove dressing in 72 hours   Complete by: As directed    Make sure all dressings are removed by the third day after surgery.  Leave incisions open to air.  OK to cover incisions with gauze or bandages as desired   Sexual Activity Restrictions   Complete by: As directed    You may have sexual intercourse when it is comfortable. If it hurts to do something, stop.      Allergies as of 02/24/2022   No Known Allergies      Medication List     STOP taking these medications  metroNIDAZOLE 500 MG tablet Commonly known as: FLAGYL   neomycin 500 MG tablet Commonly known as: MYCIFRADIN   SM ClearLax 17 GM/SCOOP powder Generic drug: polyethylene glycol powder       TAKE these medications    allopurinol 300 MG tablet Commonly known as: ZYLOPRIM Take 1 tablet (300 mg total) by mouth daily.   amLODipine 10 MG tablet Commonly known as: NORVASC Take 1 tablet (10 mg total) by mouth daily.   aspirin EC 81 MG tablet Take 1 tablet (81 mg total) by mouth daily.   atorvastatin 40 MG tablet Commonly known as: LIPITOR Take 1 tablet (40 mg total) by mouth daily. For cholesterol   Jardiance 25 MG Tabs tablet Generic drug: empagliflozin Take 1 tablet (25 mg total) by mouth daily before breakfast.   losartan 100 MG tablet Commonly known as: COZAAR Take 1 tablet (100 mg total) by mouth daily.   metFORMIN 1000 MG tablet Commonly known as: GLUCOPHAGE Take 1 tablet (1,000 mg total) by mouth 2 (two) times daily with a meal.   traMADol 50 MG tablet Commonly known as: ULTRAM Take 1-2 tablets (50-100 mg total) by mouth every 6 (six) hours as needed for moderate pain or severe pain.               Discharge Care Instructions  (From admission, onward)           Start     Ordered   02/22/22  0000  Discharge wound care:       Comments: It is good for closed incisions and even open wounds to be washed every day.  Shower every day.  Short baths are fine.  Wash the incisions and wounds clean with soap & water.    You may leave closed incisions open to air if it is dry.   You may cover the incision with clean gauze & replace it after your daily shower for comfort.  TEGADERM:  You have clear gauze band-aid dressings over your closed incision(s).  Remove the dressings 3 days after surgery.   02/22/22 0749            Follow-up Information     Michael Boston, MD. Schedule an appointment as soon as possible for a visit in 2 week(s).   Specialties: General Surgery, Colon and Rectal Surgery Contact information: [address] [place] [address] [phone]                 Signed: Rosario Adie 1/97/5883, 9:53 AM

## 2022-02-24 NOTE — Progress Notes (Signed)
Pt had BM x2 tonight.

## 2022-02-26 ENCOUNTER — Telehealth: Payer: Self-pay

## 2022-02-26 ENCOUNTER — Other Ambulatory Visit: Payer: Self-pay

## 2022-02-26 ENCOUNTER — Encounter: Payer: Self-pay | Admitting: Surgery

## 2022-02-26 LAB — SURGICAL PATHOLOGY

## 2022-02-26 NOTE — Telephone Encounter (Signed)
Transition Care Management Follow-up Telephone Call Date of discharge and from where: 02/24/2022, [facility] How have you been since you were released from the hospital? He stated he is doing pretty good, the pain is getting better. He has a gauze dressing on the surgical site that he said he has not removed yet but plans to remove it today.  He needs to shower and cleanse the area with soap and water.  Any questions or concerns? No  Items Reviewed: Did the pt receive and understand the discharge instructions provided? Yes  Medications obtained and verified? Yes - he said he has all of his medications as well as a working glucometer.  He stated he only occasionally checks his blood sugars.  Other? No  Any new allergies since your discharge? No  Dietary orders reviewed? Yes Do you have support at home? Yes   Home Care and Equipment/Supplies: Were home health services ordered? no If so, what is the name of the agency? N/a  Has the agency set up a time to come to the patient's home? not applicable Were any new equipment or medical supplies ordered?  No What is the name of the medical supply agency? N/a Were you able to get the supplies/equipment? not applicable Do you have any questions related to the use of the equipment or supplies? No  Functional Questionnaire: (I = Independent and D = Dependent) ADLs: independent  Follow up appointments reviewed:  PCP Hospital f/u appt confirmed? Yes  Scheduled to see Dr Margarita Rana - 03/22/2022.   [facility] f[facility]u [facility]t confirmed? Yes  Scheduled to see surgeon - 03/14/2022.  Are transportation arrangements needed? No  If their condition worsens, is the pt aware to call PCP or go to the Emergency Dept.? Yes Was the patient provided with contact information for the PCP's office or ED? Yes Was to pt encouraged to call back with questions or concerns? Yes

## 2022-02-26 NOTE — Telephone Encounter (Signed)
From the discharge call:   He stated he is doing pretty good, the pain is getting better. He has a gauze dressing on the surgical site that he said he has not removed yet but plans to remove it today.  He needs to shower and cleanse the area with soap and water.   he said he has all of his medications as well as a working glucometer.  He stated he only occasionally checks his blood sugars.   Scheduled to see Dr Margarita Rana - 03/22/2022 and scheduled to see surgeon - 03/14/2022.

## 2022-03-07 ENCOUNTER — Other Ambulatory Visit: Payer: Self-pay

## 2022-03-14 ENCOUNTER — Ambulatory Visit: Payer: Self-pay | Admitting: Surgery

## 2022-03-22 ENCOUNTER — Encounter: Payer: Self-pay | Admitting: Family Medicine

## 2022-03-22 ENCOUNTER — Ambulatory Visit: Payer: Medicaid Other | Attending: Family Medicine | Admitting: Family Medicine

## 2022-03-22 ENCOUNTER — Other Ambulatory Visit: Payer: Self-pay

## 2022-03-22 VITALS — BP 128/78 | HR 88 | Temp 97.9°F | Ht 75.0 in | Wt 209.2 lb

## 2022-03-22 DIAGNOSIS — E1159 Type 2 diabetes mellitus with other circulatory complications: Secondary | ICD-10-CM | POA: Diagnosis not present

## 2022-03-22 DIAGNOSIS — L299 Pruritus, unspecified: Secondary | ICD-10-CM | POA: Diagnosis not present

## 2022-03-22 DIAGNOSIS — E1169 Type 2 diabetes mellitus with other specified complication: Secondary | ICD-10-CM

## 2022-03-22 DIAGNOSIS — L711 Rhinophyma: Secondary | ICD-10-CM

## 2022-03-22 DIAGNOSIS — M1A0710 Idiopathic chronic gout, right ankle and foot, without tophus (tophi): Secondary | ICD-10-CM

## 2022-03-22 DIAGNOSIS — I152 Hypertension secondary to endocrine disorders: Secondary | ICD-10-CM

## 2022-03-22 DIAGNOSIS — F17210 Nicotine dependence, cigarettes, uncomplicated: Secondary | ICD-10-CM | POA: Diagnosis not present

## 2022-03-22 MED ORDER — PERMETHRIN 5 % EX CREA
TOPICAL_CREAM | CUTANEOUS | 1 refills | Status: DC
  Filled 2022-03-22: qty 60, 4d supply, fill #0
  Filled 2022-05-07: qty 60, 4d supply, fill #1

## 2022-03-22 MED ORDER — HYDROXYZINE HCL 25 MG PO TABS
25.0000 mg | ORAL_TABLET | Freq: Every evening | ORAL | 1 refills | Status: DC | PRN
  Filled 2022-03-22: qty 30, 30d supply, fill #0
  Filled 2022-05-07: qty 30, 30d supply, fill #1
  Filled 2022-10-05: qty 30, 30d supply, fill #2

## 2022-03-22 NOTE — Progress Notes (Signed)
Subjective:  Patient ID: Donald Bender, male    DOB: [birth year]  Age: 62 y.o. MRN: [id number]  CC: Hypertension   HPI Donald Bender is a 62 y.o. year old male with a history of type 2 diabetes mellitus (A1c 5.6), hypertension, polycythemia, tobacco abuse (2-3 packs of cigarettes/day for close to 50 years)   Interval History:  He has had generalized itching worse at night to the extent that he scratches and bleeds. He does not see a rash but just itches. He has not changed his body products or detergent.  He does not share a bed with anyone. 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   Diabetes is controlled with no hypoglycemia, neuropathy or visual concerns.  Endorses adherence with his statin and his antihypertensive and has no adverse effects from his medications.  He still smokes and states Medicaid has said they would send patches to him. He has cut back to 1 ppd. Past Medical History:  Diagnosis Date   Acute respiratory failure with hypoxia ([place]) 07/25/2021   Arthritis    osteoarthritis and gout   Diabetes mellitus without complication ([place])    Diverticulitis    Hypertension     Past Surgical History:  Procedure Laterality Date   COLONOSCOPY     HYDROCELE EXCISION / REPAIR     NO PAST SURGERIES     PROCTOSCOPY N/A 02/22/2022   Procedure: RIGID PROCTOSCOPY;  Surgeon: Michael Boston, MD;  Location: [facility];  Service: General;  Laterality: N/A;   XI ROBOTIC ASSISTED LOWER ANTERIOR RESECTION  02/22/2022   for diverticulitis/takedown of colovesical fistula   XI ROBOTIC LAPAROSCOPIC ASSISTED APPENDECTOMY  02/22/2022    Family History  Problem Relation Age of Onset   Leukemia Mother 39   Diverticulitis Brother    Colon cancer Neg Hx    Stomach cancer Neg Hx    Esophageal cancer Neg Hx    Colon polyps Neg Hx     Social History   Socioeconomic History   Marital status: Married    Spouse name: Not on file   Number  of children: 0   Years of education: Not on file   Highest education level: Not on file  Occupational History   Occupation: Architect  Tobacco Use   Smoking status: Every Day    Packs/day: 1.00    Types: Cigarettes   Smokeless tobacco: Never   Tobacco comments:    Smoking 1 ppd  Vaping Use   Vaping Use: Never used  Substance and Sexual Activity   Alcohol use: Not Currently    Alcohol/week: 6.0 standard drinks of alcohol    Types: 6 Cans of beer per week    Comment: quit drinking   Drug use: No   Sexual activity: Yes  Other Topics Concern   Not on file  Social History Narrative   Not on file   Social Determinants of Health   Financial Resource Strain: Not on file  Food Insecurity: No Food Insecurity (02/22/2022)   Hunger Vital Sign    Worried About Running Out of Food in the Last Year: Never true    Ran Out of Food in the Last Year: Never true  Transportation Needs: No Transportation Needs (02/22/2022)   PRAPARE - Hydrologist (Medical): No    Lack of Transportation (Non-Medical): No  Physical Activity: Not on file  Stress: Not on file  Social Connections: Not on file  No Known Allergies  Outpatient Medications Prior to Visit  Medication Sig Dispense Refill   allopurinol (ZYLOPRIM) 300 MG tablet Take 1 tablet (300 mg total) by mouth daily. 30 tablet 6   amLODipine (NORVASC) 10 MG tablet Take 1 tablet (10 mg total) by mouth daily. 90 tablet 3   aspirin EC 81 MG tablet Take 1 tablet (81 mg total) by mouth daily. 30 tablet 5   atorvastatin (LIPITOR) 40 MG tablet Take 1 tablet (40 mg total) by mouth daily. For cholesterol 30 tablet 6   empagliflozin (JARDIANCE) 25 MG TABS tablet Take 1 tablet (25 mg total) by mouth daily before breakfast. 90 tablet 1   losartan (COZAAR) 100 MG tablet Take 1 tablet (100 mg total) by mouth daily. 30 tablet 6   metFORMIN (GLUCOPHAGE) 1000 MG tablet Take 1 tablet (1,000 mg total) by mouth 2 (two) times daily  with a meal. 180 tablet 3   traMADol (ULTRAM) 50 MG tablet Take 1 tablet (50 mg total) by mouth every 6 (six) hours as needed for moderate pain or severe pain. (Patient not taking: Reported on 03/22/2022) 20 tablet 0   Facility-Administered Medications Prior to Visit  Medication Dose Route Frequency Provider Last Rate Last Admin   0.9 %  sodium chloride infusion  500 mL Intravenous Once Cirigliano, Vito V, DO         ROS Review of Systems  Constitutional:  Negative for activity change and appetite change.  HENT:  Negative for sinus pressure and sore throat.   Respiratory:  Negative for chest tightness, shortness of breath and wheezing.   Cardiovascular:  Negative for chest pain and palpitations.  Gastrointestinal:  Negative for abdominal distention, abdominal pain and constipation.  Genitourinary: Negative.   Musculoskeletal: Negative.   Skin:        See HPI  Psychiatric/Behavioral:  Negative for behavioral problems and dysphoric mood.     Objective:  BP 128/78   Pulse 88   Temp 97.9 F (36.6 C) (Oral)   Ht 6' 3"$  (1.905 m)   Wt 209 lb 3.2 oz (94.9 kg)   SpO2 96%   BMI 26.15 kg/m      03/22/2022   11:16 AM 02/24/2022    5:32 AM 02/24/2022    5:00 AM  BP/Weight  Systolic BP 0000000 [id number]   Diastolic BP 78 72   Wt. (Lbs) 209.2  204.54  BMI 26.15 kg/m2  25.57 kg/m2      Physical Exam Constitutional:      Appearance: He is well-developed.  HENT:     Nose:     Comments: Rhinophyma Cardiovascular:     Rate and Rhythm: Normal rate.     Heart sounds: Normal heart sounds. No murmur heard. Pulmonary:     Effort: Pulmonary effort is normal.     Breath sounds: Normal breath sounds. No wheezing or rales.  Chest:     Chest wall: No tenderness.  Abdominal:     General: Bowel sounds are normal. There is no distension.     Palpations: Abdomen is soft. There is no mass.     Tenderness: There is no abdominal tenderness.  Musculoskeletal:        General: Normal range of motion.      Right lower leg: No edema.     Left lower leg: No edema.  Skin:    Comments: Erythematous discoloration in superior portion of anterior trunk and superior half of posterior trunk.  Presence of hypopigmented well-circumscribed rash in  mid back with corrugated surface. Scratch marks noticed on extremities and trunk but no obvious rash.  Neurological:     Mental Status: He is alert and oriented to person, place, and time.  Psychiatric:        Mood and Affect: Mood normal.        Latest Ref Rng & Units 02/23/2022    4:40 AM 02/14/2022    2:30 PM 12/05/2021    3:06 AM  CMP  Glucose 70 - 99 mg/dL 114  101  102   BUN 8 - 23 mg/dL 20  13  8   $ Creatinine 0.61 - 1.24 mg/dL 0.74  0.66  0.71   Sodium 135 - 145 mmol/L 137  137  137   Potassium 3.5 - 5.1 mmol/L 4.1  4.3  3.7   Chloride 98 - 111 mmol/L 105  103  102   CO2 22 - 32 mmol/L 23  26  26   $ Calcium 8.9 - 10.3 mg/dL 8.8  9.4  8.5   Total Protein 6.5 - 8.1 g/dL  8.0    Total Bilirubin 0.3 - 1.2 mg/dL  0.2    Alkaline Phos 38 - 126 U/L  51    AST 15 - 41 U/L  11    ALT 0 - 44 U/L  11      Lipid Panel     Component Value Date/Time   CHOL 186 07/24/2021 1929   CHOL 215 (H) 01/24/2017 1512   TRIG 158 (H) 07/24/2021 1929   HDL 33 (L) 07/24/2021 1929   HDL 53 01/24/2017 1512   CHOLHDL 5.6 07/24/2021 1929   VLDL 32 07/24/2021 1929   LDLCALC 121 (H) 07/24/2021 1929   LDLCALC Comment 01/24/2017 1512    CBC    Component Value Date/Time   WBC 10.9 (H) 02/23/2022 0440   RBC 4.23 02/23/2022 0440   HGB 12.2 (L) 02/23/2022 0440   HCT 38.0 (L) 02/23/2022 0440   PLT 176 02/23/2022 0440   MCV 89.8 02/23/2022 0440   MCH 28.8 02/23/2022 0440   MCHC 32.1 02/23/2022 0440   RDW 16.3 (H) 02/23/2022 0440   LYMPHSABS 1.6 11/30/2021 1858   MONOABS 1.6 (H) 11/30/2021 1858   EOSABS 0.0 11/30/2021 1858   BASOSABS 0.1 11/30/2021 1858    Lab Results  Component Value Date   HGBA1C 5.6 02/14/2022    Assessment & Plan:  1. Smoking greater  than 20 pack years Smoking cessation support: smoking cessation hotline: 1-800-QUIT-NOW.  Smoking cessation classes are available through [facility]. Call [phone] or visit our website at [email or web address]  Spent 3 minutes counseling on dangers of tobacco use and benefits of quitting, offered pharmacological intervention to aid quitting and patient is ready to quit. He is expecting nicotine patches from his insurance but if he does not receive states he will be open to trying Wellbutrin - CT CHEST LUNG CANCER SCREENING LOW DOSE WO CONTRAST; Future  2. Pruritus Unknown etiology History suggestive of possible scabies We have discussed management of beddings and treatment of scabies and I have prescribed permethrin for him Advised that dry skin is also possibility and he should use moisturizers He will be following up with dermatology and has been advised to discuss this with them if symptoms persist as well as presence of the hyperpigmented rash on his back. - hydrOXYzine (ATARAX) 25 MG tablet; Take 1 tablet (25 mg total) by mouth at bedtime as needed for itching.  Dispense: 60  tablet; Refill: 1 - permethrin (ELIMITE) 5 % cream; Apply from head to toe and leave in for 8 to 14 hours and wash off.  May repeat in 72 hours.  Dispense: 60 g; Refill: 1  3. Type 2 diabetes mellitus with other specified complication, without long-term current use of insulin (HCC) Controlled with A1c of 5.6 Continue metformin and Jardiance Counseled on Diabetic diet, my plate method, [id number] minutes of moderate intensity exercise/week Blood sugar logs with fasting goals of 80-120 mg/dl, random of less than 180 and in the event of sugars less than 60 mg/dl or greater than 400 mg/dl encouraged to notify the clinic. Advised on the need for annual eye exams, annual foot exams, Pneumonia vaccine.  4. Hypertension associated with diabetes ([place]) Controlled Continue losartan, amlodipine  in Counseled on blood pressure goal of less than 130/80, low-sodium, DASH diet, medication compliance, 150 minutes of moderate intensity exercise per week. Discussed medication compliance, adverse effects.  5. Rhinophyma Upcoming appointment with dermatology  6. Idiopathic chronic gout of right foot without tophus Stable with no flares Continue allopurinol    Meds ordered this encounter  Medications   hydrOXYzine (ATARAX) 25 MG tablet    Sig: Take 1 tablet (25 mg total) by mouth at bedtime as needed for itching.    Dispense:  60 tablet    Refill:  1   permethrin (ELIMITE) 5 % cream    Sig: Apply from head to toe and leave in for 8 to 14 hours and wash off.  May repeat in 72 hours.    Dispense:  60 g    Refill:  1    Follow-up: Return in about 6 months (around 09/20/2022) for Chronic medical conditions.       Charlott Rakes, MD, FAAFP. [facility] [place], [place]   03/22/2022, 12:01 PM

## 2022-03-22 NOTE — Patient Instructions (Signed)

## 2022-03-22 NOTE — Progress Notes (Signed)
States that he has itching all over his body. Pain in both shoulders.

## 2022-03-30 DIAGNOSIS — L700 Acne vulgaris: Secondary | ICD-10-CM | POA: Diagnosis not present

## 2022-03-30 DIAGNOSIS — D485 Neoplasm of uncertain behavior of skin: Secondary | ICD-10-CM | POA: Diagnosis not present

## 2022-04-10 ENCOUNTER — Other Ambulatory Visit: Payer: Self-pay

## 2022-04-11 ENCOUNTER — Encounter: Payer: Self-pay | Admitting: Family Medicine

## 2022-04-13 ENCOUNTER — Ambulatory Visit
Admission: RE | Admit: 2022-04-13 | Discharge: 2022-04-13 | Disposition: A | Discharge status: Home / Self Care | Payer: Medicaid Other | Source: Ambulatory Visit | Attending: Family Medicine | Admitting: Family Medicine

## 2022-04-13 DIAGNOSIS — F17210 Nicotine dependence, cigarettes, uncomplicated: Secondary | ICD-10-CM

## 2022-04-13 DIAGNOSIS — J432 Centrilobular emphysema: Secondary | ICD-10-CM | POA: Diagnosis not present

## 2022-04-13 DIAGNOSIS — I700 Atherosclerosis of aorta: Secondary | ICD-10-CM | POA: Diagnosis not present

## 2022-04-13 DIAGNOSIS — I2510 Atherosclerotic heart disease of native coronary artery without angina pectoris: Secondary | ICD-10-CM | POA: Diagnosis not present

## 2022-04-17 ENCOUNTER — Other Ambulatory Visit: Payer: Self-pay | Admitting: Family Medicine

## 2022-04-17 DIAGNOSIS — I2510 Atherosclerotic heart disease of native coronary artery without angina pectoris: Secondary | ICD-10-CM

## 2022-05-08 ENCOUNTER — Other Ambulatory Visit: Payer: Self-pay

## 2022-05-14 ENCOUNTER — Other Ambulatory Visit: Payer: Self-pay

## 2022-05-16 ENCOUNTER — Other Ambulatory Visit: Payer: Self-pay

## 2022-05-20 IMAGING — DX DG CHEST 2V
2 series · 2 of 2 positions shown · non-contrast
Comparison: None.

CLINICAL DATA: Hand pain

EXAM:
CHEST - 2 VIEW

[chest pa]
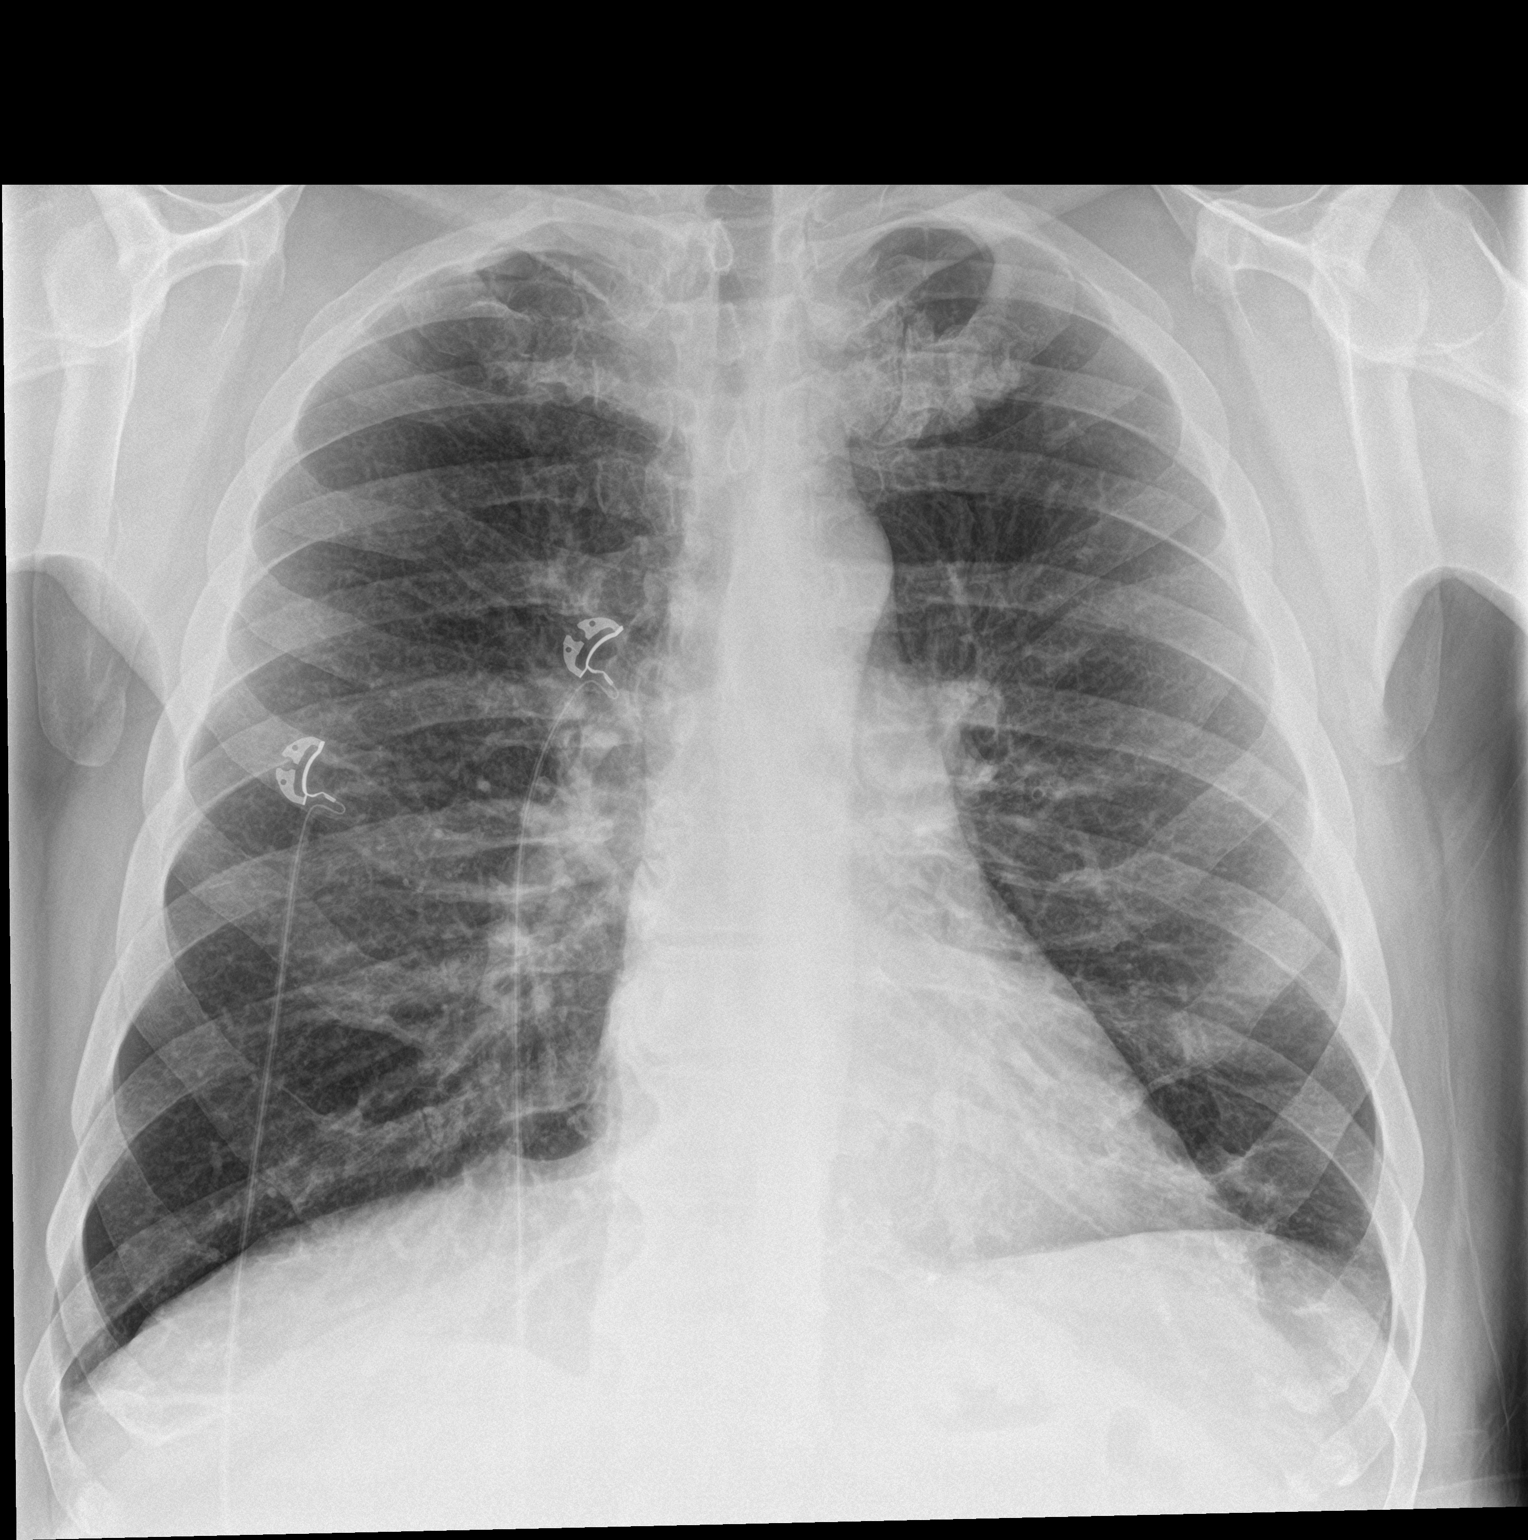

[chest lat]
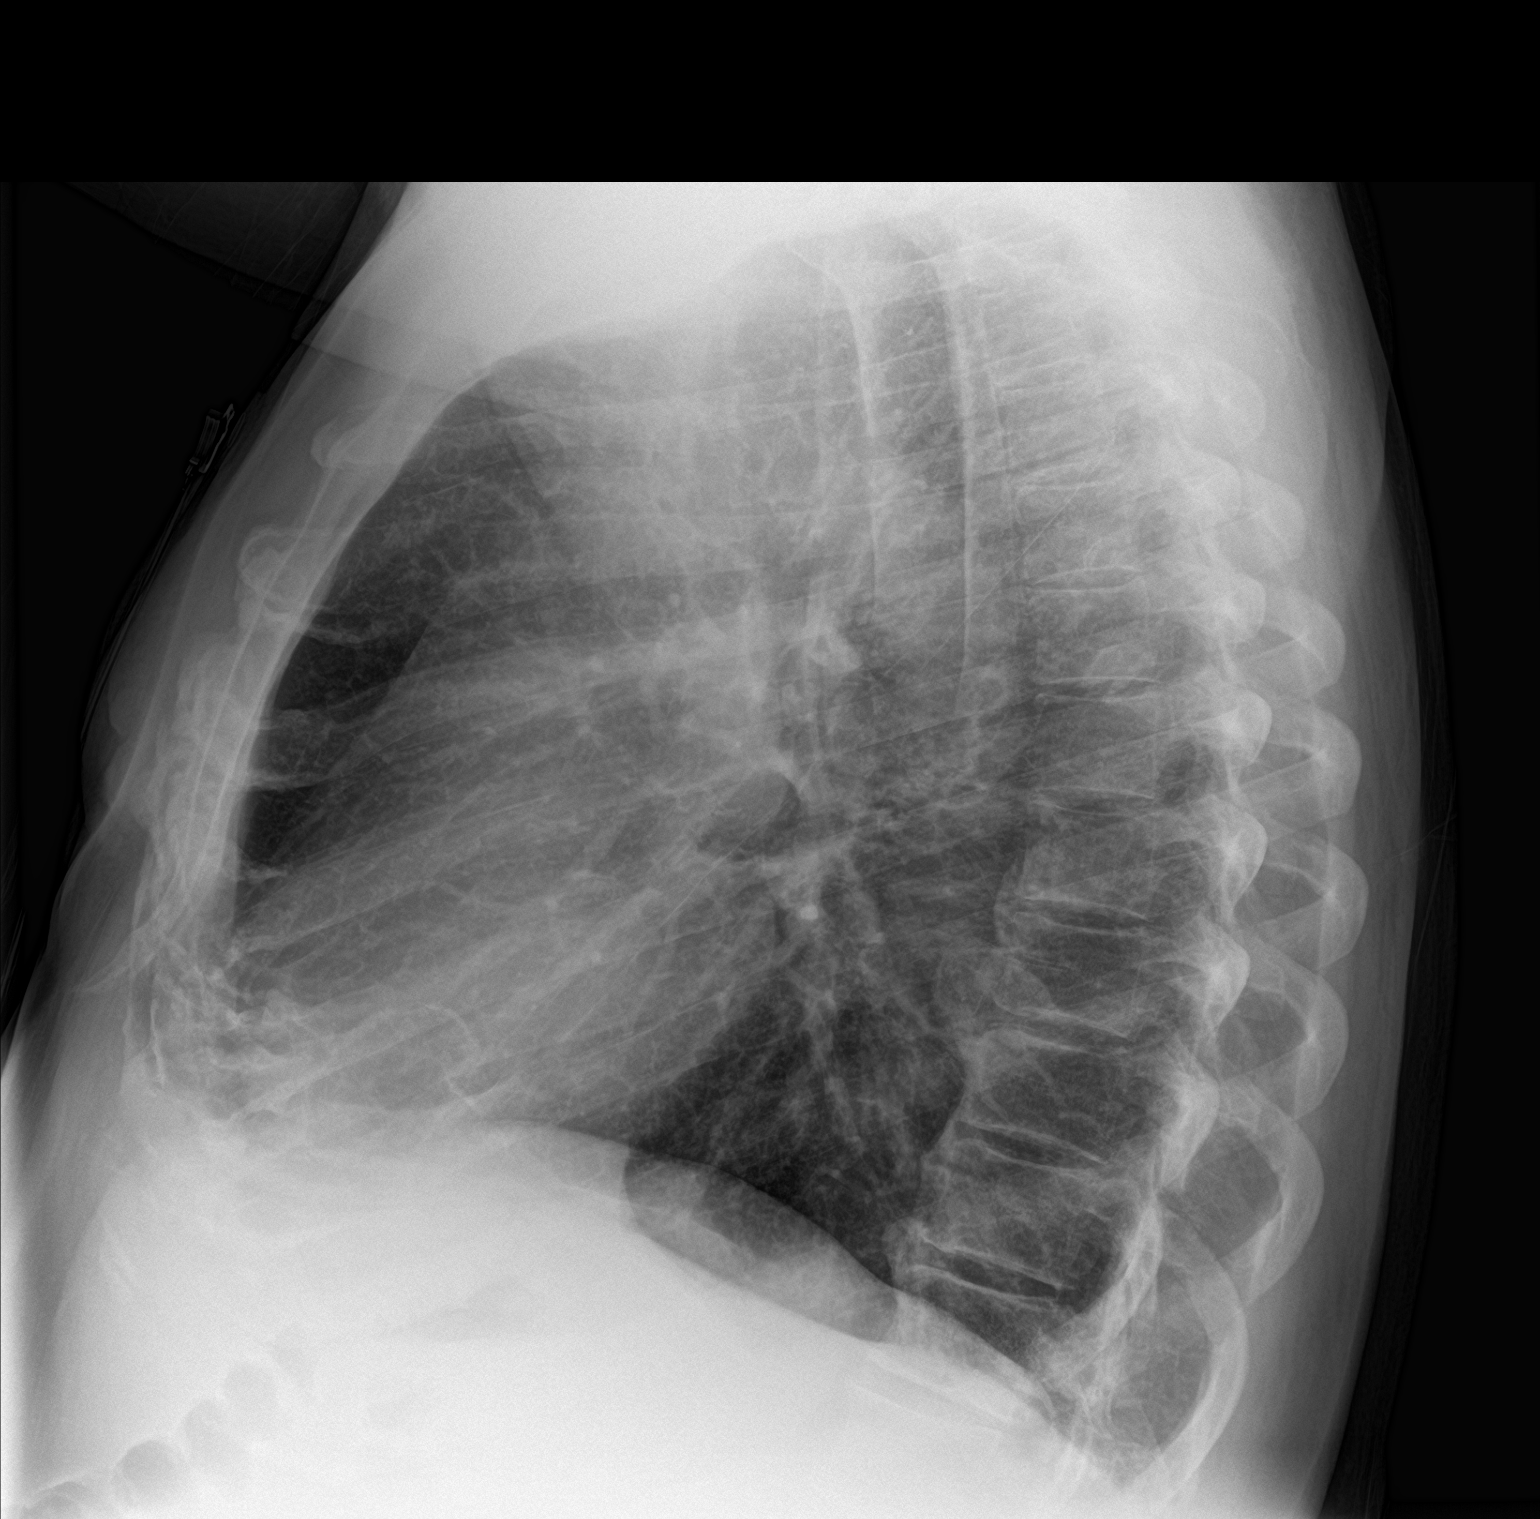

[2 of 2 positions shown; findings below may reference images not displayed]

FINDINGS: The cardiomediastinal silhouette is normal in contour. No pleural
effusion. No pneumothorax. No acute pleuroparenchymal abnormality.
Visualized abdomen is unremarkable. Mild degenerative changes of the
thoracic spine.
IMPRESSION: No acute cardiopulmonary abnormality.

## 2022-05-21 IMAGING — MR MR [PERSON_NAME]*[PERSON_NAME]* WO/W CM
11 series · 39 of 40 positions shown · IV contrast (Gadavist)
Comparison: Plain films right hand 03/18/2020.

CLINICAL DATA: Diabetic patient with right hand pain, swelling and
redness developing over the past 3 days since the patient gouty
splinter in the distal right long finger.

EXAM:
MRI OF THE RIGHT HAND WITHOUT AND WITH CONTRAST
TECHNIQUE: Multiplanar, multisequence MR imaging of the right hand was
performed before and after the administration of intravenous
contrast.
CONTRAST:  10 mL GADAVIST IV SOLN

[Series 12: PD fat-sat · coronal · right · 3.0mm · 0.45mm/px · 3 of 38 slices shown]
[im 1/38]
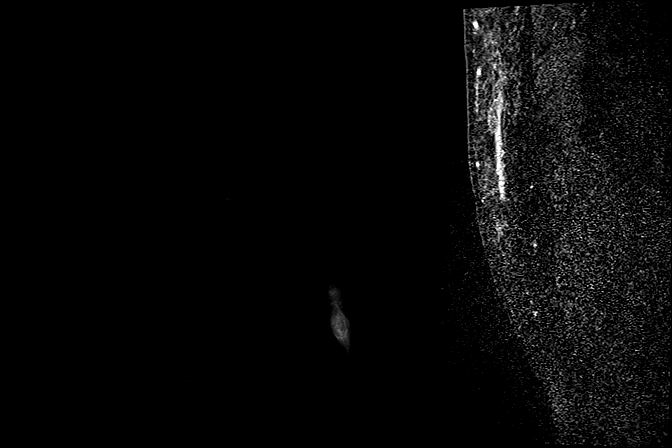
[im 19/38]
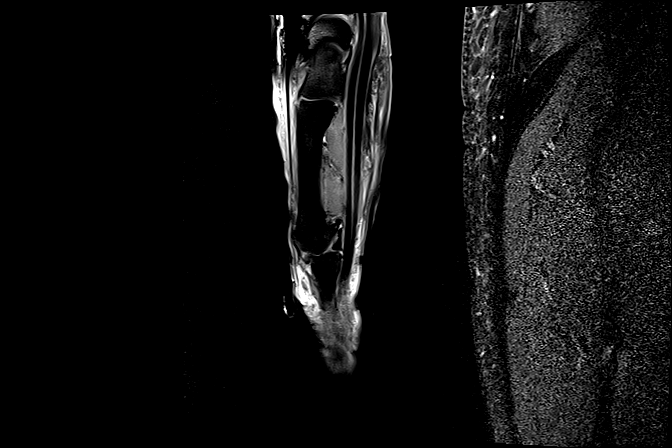
[im 38/38]
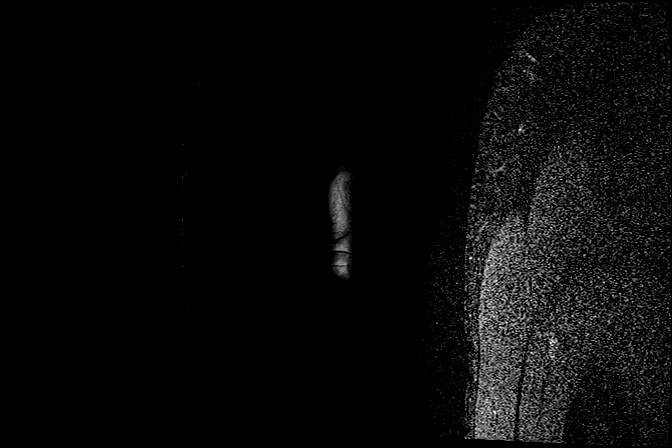

[Series 13: T1 · axial · right · 4.0mm · 0.40mm/px · z∈[-122,+75]mm · 4 of 45 slices shown (1 of 2)]
[im 1/45]
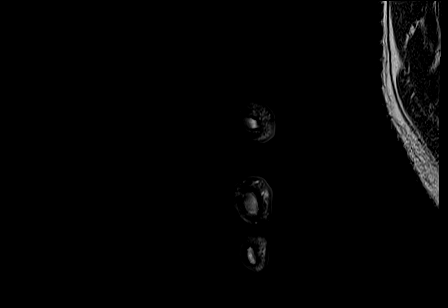
[im 15/45]
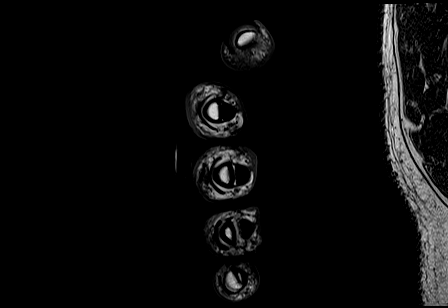
[im 30/45]
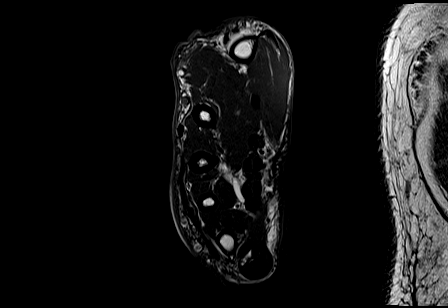
[im 45/45]
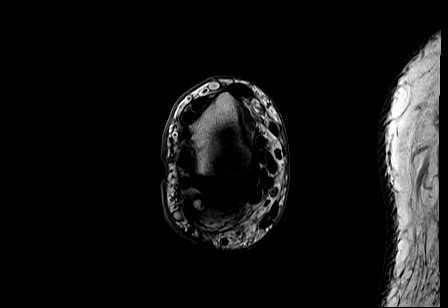

[Series 14: T2 fat-sat · axial · right · 4.0mm · 0.44mm/px · z∈[-125,+77]mm · 5 of 45 slices shown]
[im 1/45]
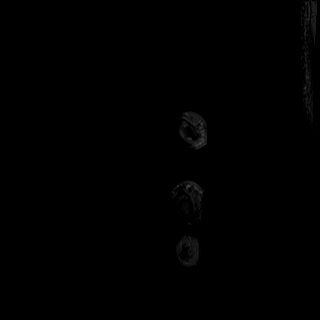
[im 12/45]
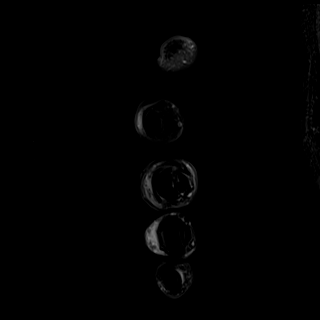
[im 23/45]
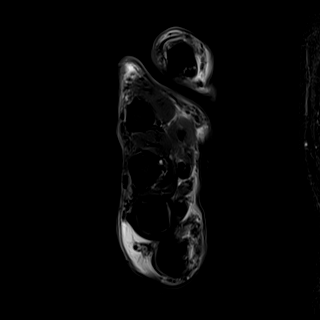
[im 34/45]
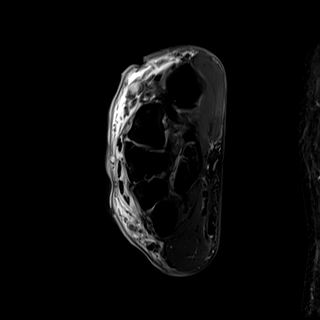
[im 45/45]
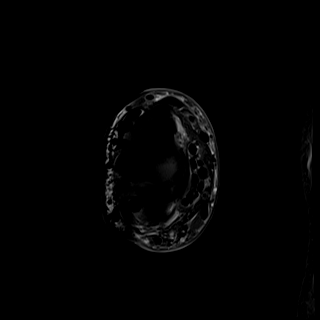

[Series 15: T1 fat-sat · axial · non-contrast · right · 4.0mm · 0.55mm/px · z∈[-120,+72]mm · 5 of 44 slices shown (1 of 5)]
[im 1/44]
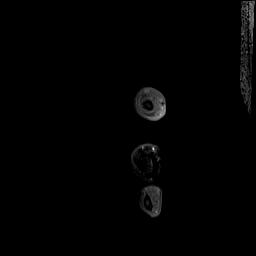
[im 11/44]
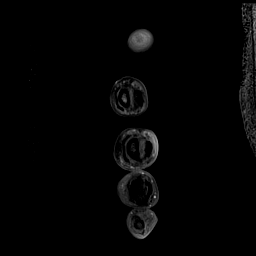
[im 22/44]
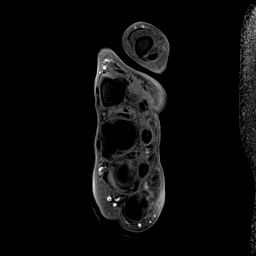
[im 33/44]
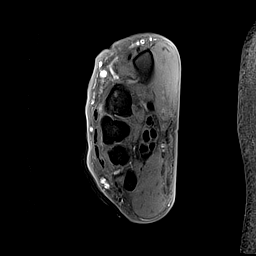
[im 44/44]
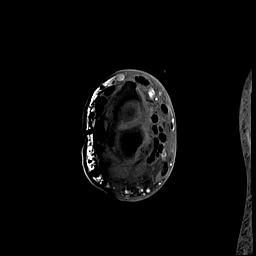

[Series 16: T1 fat-sat · axial · non-contrast · right · 4.0mm · 0.55mm/px · z∈[-120,+72]mm · 5 of 44 slices shown (2 of 5)]
[im 1/44]
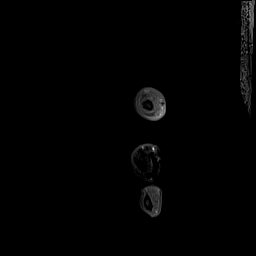
[im 11/44]
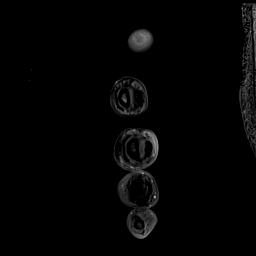
[im 22/44]
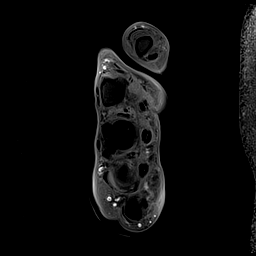
[im 33/44]
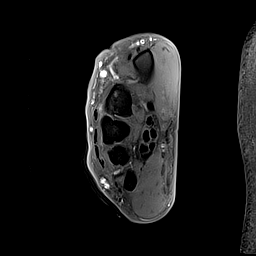
[im 44/44]
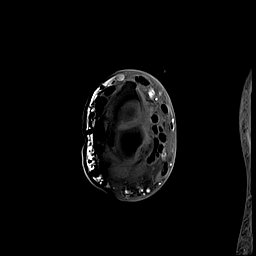

[Series 17: STIR · sagittal · right · 3.0mm · 0.47mm/px · 1 of 20 slices shown]
[im 1/20]
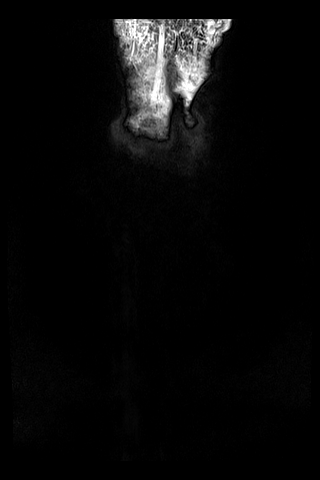

[Series 18: T1 · sagittal · right · 3.0mm · 0.33mm/px · 2 of 20 slices shown (2 of 2)]
[im 1/20]
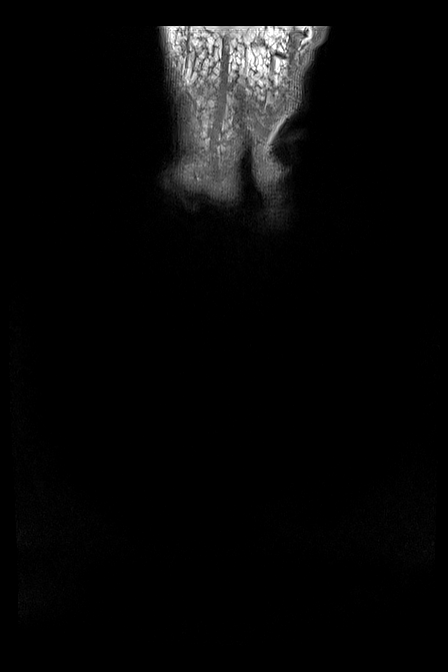
[im 20/20]
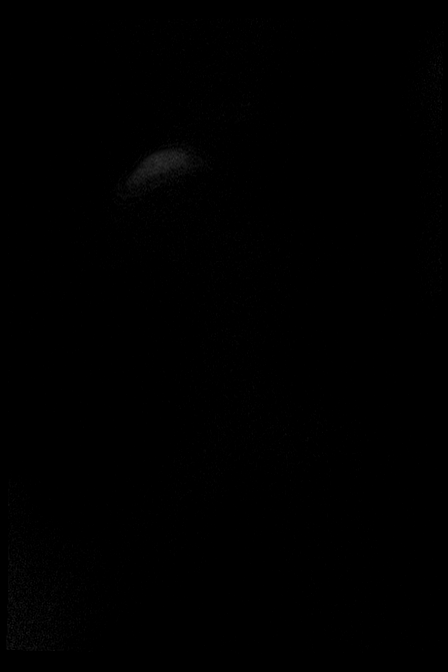

[Series 19: T1 fat-sat · axial · non-contrast · right · 4.0mm · 0.55mm/px · z∈[-120,+72]mm · 5 of 44 slices shown (3 of 5)]
[im 1/44]
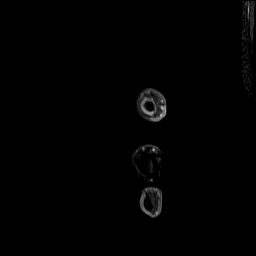
[im 11/44]
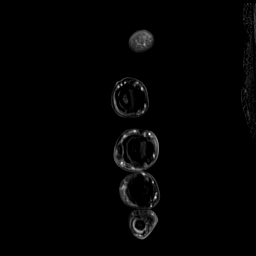
[im 22/44]
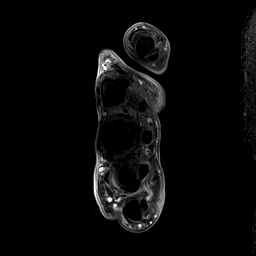
[im 33/44]
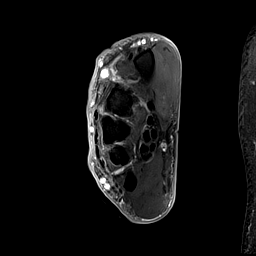
[im 44/44]
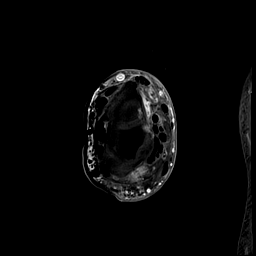

[Series 20: T1 fat-sat · axial · non-contrast · right · 4.0mm · 0.55mm/px · z∈[-120,+72]mm · 5 of 44 slices shown (4 of 5)]
[im 1/44]
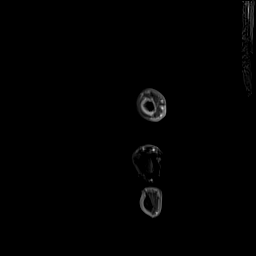
[im 11/44]
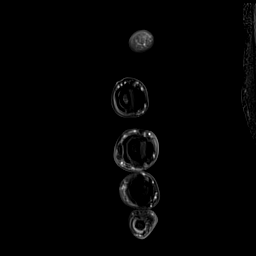
[im 22/44]
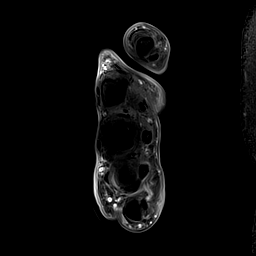
[im 33/44]
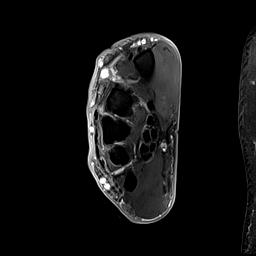
[im 44/44]
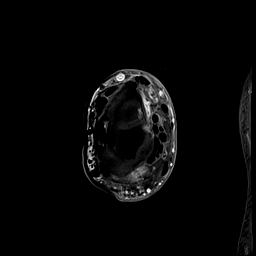

[Series 21: T1 fat-sat · sagittal · right · 3.0mm · 0.39mm/px · 2 of 20 slices shown (5 of 5)]
[im 1/20]
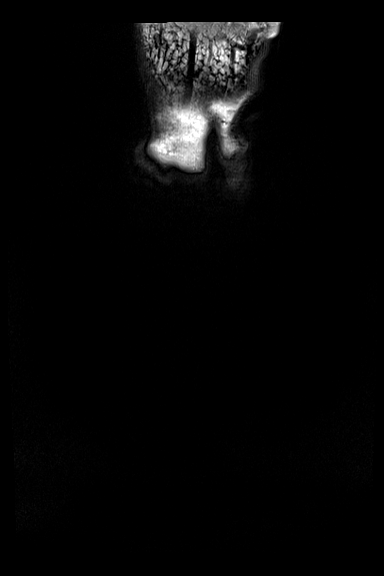
[im 20/20]
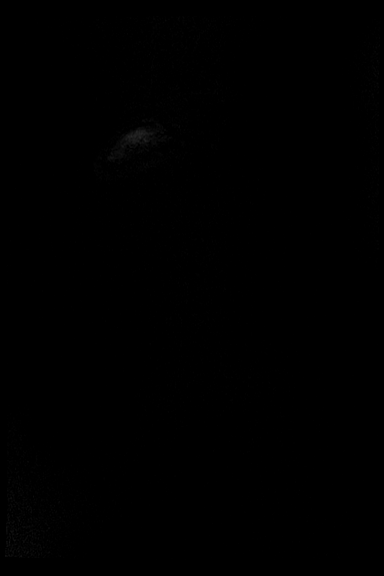

[Series 22: T1 fat-sat post-contrast · sagittal · right · 3.0mm · 0.39mm/px · 2 of 20 slices shown]
[im 1/20]
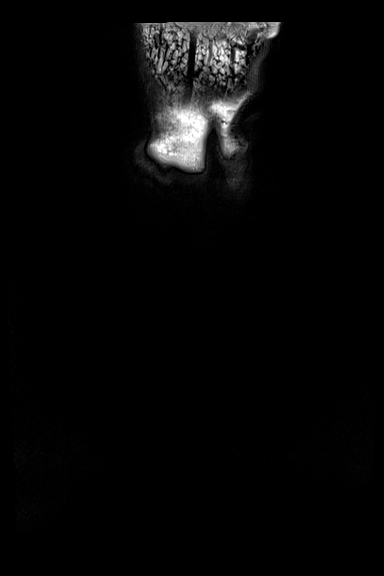
[im 20/20]
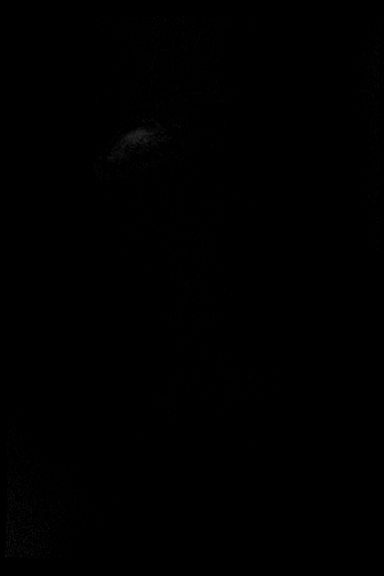

[39 of 40 positions shown; findings below may reference images not displayed]

FINDINGS: Bones/Joint/Cartilage

No marrow edema or enhancement to suggest osteomyelitis. No joint
effusion. Scattered, mild osteoarthritis noted.

Ligaments

Intact.

Muscles and Tendons

Intact.  No evidence of septic tenosynovitis.

Soft tissues

Subcutaneous edema and enhancement are present about the hand. No
abscess. No foreign body is visualized.
IMPRESSION: Findings consistent with cellulitis about the hand. Negative for
septic tenosynovitis, osteomyelitis, myositis or septic joint.

## 2022-05-28 ENCOUNTER — Encounter: Payer: Self-pay | Admitting: Interventional Cardiology

## 2022-05-28 ENCOUNTER — Ambulatory Visit: Payer: Medicaid Other | Attending: Interventional Cardiology | Admitting: Interventional Cardiology

## 2022-05-28 ENCOUNTER — Other Ambulatory Visit: Payer: Self-pay

## 2022-05-28 VITALS — BP 112/62 | HR 86 | Ht 75.0 in | Wt 215.0 lb

## 2022-05-28 DIAGNOSIS — E119 Type 2 diabetes mellitus without complications: Secondary | ICD-10-CM

## 2022-05-28 DIAGNOSIS — I700 Atherosclerosis of aorta: Secondary | ICD-10-CM | POA: Diagnosis not present

## 2022-05-28 DIAGNOSIS — E785 Hyperlipidemia, unspecified: Secondary | ICD-10-CM

## 2022-05-28 DIAGNOSIS — I152 Hypertension secondary to endocrine disorders: Secondary | ICD-10-CM

## 2022-05-28 DIAGNOSIS — E1169 Type 2 diabetes mellitus with other specified complication: Secondary | ICD-10-CM | POA: Diagnosis not present

## 2022-05-28 DIAGNOSIS — I2510 Atherosclerotic heart disease of native coronary artery without angina pectoris: Secondary | ICD-10-CM

## 2022-05-28 DIAGNOSIS — E1159 Type 2 diabetes mellitus with other circulatory complications: Secondary | ICD-10-CM

## 2022-05-28 MED ORDER — NICOTINE 21 MG/24HR TD PT24
21.0000 mg | MEDICATED_PATCH | Freq: Every day | TRANSDERMAL | 2 refills | Status: DC
  Filled 2022-05-28: qty 28, 28d supply, fill #0

## 2022-05-28 MED ORDER — ATORVASTATIN CALCIUM 80 MG PO TABS
80.0000 mg | ORAL_TABLET | Freq: Every day | ORAL | 3 refills | Status: DC
  Filled 2022-05-28: qty 90, 90d supply, fill #0
  Filled 2022-08-24 – 2022-08-31 (×2): qty 90, 90d supply, fill #1
  Filled 2023-01-07: qty 90, 90d supply, fill #2
  Filled 2023-04-16: qty 90, 90d supply, fill #3

## 2022-05-28 NOTE — Patient Instructions (Addendum)
Medication Instructions:  Your physician has recommended you make the following change in your medication:  Increase atorvastatin to 80 mg by mouth daily . Start 21 mg nicotine patch daily.  Let us know when you are ready to decrease dose to 14 mg daily and a new prescription can be sent to your pharmacy at that time   *If you need a refill on your cardiac medications before your next appointment, please call your pharmacy*   Lab Work: Your physician recommends that you return for lab work in 3 months. July 29. This will be fasting.  The lab opens at 7:15 AM. Lipid and liver profiles.   If you have labs (blood work) drawn today and your tests are completely normal, you will receive your results only by: MyChart Message (if you have MyChart) OR A paper copy in the mail If you have any lab test that is abnormal or we need to change your treatment, we will call you to review the results.   Testing/Procedures: none   Follow-Up: At [facility], you and your health needs are our priority.  As part of our continuing mission to provide you with exceptional heart care, we have created designated Provider Care Teams.  These Care Teams include your primary Cardiologist (physician) and Advanced Practice Providers (APPs -  Physician Assistants and Nurse Practitioners) who all work together to provide you with the care you need, when you need it.  We recommend signing up for the patient portal called "MyChart".  Sign up information is provided on this After Visit Summary.  MyChart is used to connect with patients for Virtual Visits (Telemedicine).  Patients are able to view lab/test results, encounter notes, upcoming appointments, etc.  Non-urgent messages can be sent to your provider as well.   To learn more about what you can do with MyChart, go to [email or web address].    Your next appointment:   12 month(s)  Provider:   Orbie Pyo, MD     Other Instructions  High-Fiber  Eating Plan Fiber, also called dietary fiber, is a type of carbohydrate. It is found foods such as fruits, vegetables, whole grains, and beans. A high-fiber diet can have many health benefits. Your health care provider may recommend a high-fiber diet to help: Prevent constipation. Fiber can make your bowel movements more regular. Lower your cholesterol. Relieve the following conditions: Inflammation of veins in the anus (hemorrhoids). Inflammation of specific areas of the digestive tract (uncomplicated diverticulosis). A problem of the large intestine, also called the colon, that sometimes causes pain and diarrhea (irritable bowel syndrome, or IBS). Prevent overeating as part of a weight-loss plan. Prevent heart disease, type 2 diabetes, and certain cancers. What are tips for following this plan? Reading food labels  Check the nutrition facts label on food products for the amount of dietary fiber. Choose foods that have 5 grams of fiber or more per serving. The goals for recommended daily fiber intake include: Men (age 67 or younger): 34-38 g. Men (over age 41): 28-34 g. Women (age 29 or younger): 25-28 g. Women (over age 100): 22-25 g. Your daily fiber goal is _____________ g. Shopping Choose whole fruits and vegetables instead of processed forms, such as apple juice or applesauce. Choose a wide variety of high-fiber foods such as avocados, lentils, oats, and kidney beans. Read the nutrition facts label of the foods you choose. Be aware of foods with added fiber. These foods often have high sugar and sodium amounts per serving.  Cooking Use whole-grain flour for baking and cooking. Cook with brown rice instead of white rice. Meal planning Start the day with a breakfast that is high in fiber, such as a cereal that contains 5 g of fiber or more per serving. Eat breads and cereals that are made with whole-grain flour instead of refined flour or white flour. Eat brown rice, bulgur wheat, or  millet instead of white rice. Use beans in place of meat in soups, salads, and pasta dishes. Be sure that half of the grains you eat each day are whole grains. General information You can get the recommended daily intake of dietary fiber by: Eating a variety of fruits, vegetables, grains, nuts, and beans. Taking a fiber supplement if you are not able to take in enough fiber in your diet. It is better to get fiber through food than from a supplement. Gradually increase how much fiber you consume. If you increase your intake of dietary fiber too quickly, you may have bloating, cramping, or gas. Drink plenty of water to help you digest fiber. Choose high-fiber snacks, such as berries, raw vegetables, nuts, and popcorn. What foods should I eat? Fruits Berries. Pears. Apples. Oranges. Avocado. Prunes and raisins. Dried figs. Vegetables Sweet potatoes. Spinach. Kale. Artichokes. Cabbage. Broccoli. Cauliflower. Green peas. Carrots. Squash. Grains Whole-grain breads. Multigrain cereal. Oats and oatmeal. Brown rice. Barley. Bulgur wheat. Millet. Quinoa. Bran muffins. Popcorn. Rye wafer crackers. Meats and other proteins Navy beans, kidney beans, and pinto beans. Soybeans. Split peas. Lentils. Nuts and seeds. Dairy Fiber-fortified yogurt. Beverages Fiber-fortified soy milk. Fiber-fortified orange juice. Other foods Fiber bars. The items listed above may not be a complete list of recommended foods and beverages. Contact a dietitian for more information. What foods should I avoid? Fruits Fruit juice. Cooked, strained fruit. Vegetables Fried potatoes. Canned vegetables. Well-cooked vegetables. Grains White bread. Pasta made with refined flour. White rice. Meats and other proteins Fatty cuts of meat. Fried chicken or fried fish. Dairy Milk. Yogurt. Cream cheese. Sour cream. Fats and oils Butters. Beverages Soft drinks. Other foods Cakes and pastries. The items listed above may not be a  complete list of foods and beverages to avoid. Talk with your dietitian about what choices are best for you. Summary Fiber is a type of carbohydrate. It is found in foods such as fruits, vegetables, whole grains, and beans. A high-fiber diet has many benefits. It can help to prevent constipation, lower blood cholesterol, aid weight loss, and reduce your risk of heart disease, diabetes, and certain cancers. Increase your intake of fiber gradually. Increasing fiber too quickly may cause cramping, bloating, and gas. Drink plenty of water while you increase the amount of fiber you consume. The best sources of fiber include whole fruits and vegetables, whole grains, nuts, seeds, and beans. This information is not intended to replace advice given to you by your health care provider. Make sure you discuss any questions you have with your health care provider. Document Revised: 05/21/2019 Document Reviewed: 05/21/2019 Elsevier Patient Education  2023 [employer].

## 2022-05-28 NOTE — Progress Notes (Signed)
Cardiology Office Note   Date:  05/28/2022   ID:  Donald Bender, DOB 1960/11/04, MRN [id number]  PCP:  Hoy Register, MD    No chief complaint on file.  Coronary artery calcification  Wt Readings from Last 3 Encounters:  05/28/22 215 lb (97.5 kg)  03/22/22 209 lb 3.2 oz (94.9 kg)  02/24/22 204 lb 8.7 oz (92.8 kg)       History of Present Illness: Donald Bender is a 62 y.o. male  with emphysema by a CT scan in March 2024, and also incidentally, coronary artery calcification was noted.  He continues to smoke after 50 years.  He had surgery for diverticulitis in December 2023.  No cardiac issues at that time.  Saw Dr. Lynnette Caffey for preoperative evaluation.  He completed 4 METS, no stress testing was done.    BP can run in the 130-140s range.  Denies : Chest pain. Dizziness. Leg edema. Nitroglycerin use. Orthopnea. Palpitations. Paroxysmal nocturnal dyspnea. Shortness of breath. Syncope.      Past Medical History:  Diagnosis Date   Acute respiratory failure with hypoxia (HCC) 07/25/2021   Arthritis    osteoarthritis and gout   Diabetes mellitus without complication (HCC)    Diverticulitis    Hypertension     Past Surgical History:  Procedure Laterality Date   COLONOSCOPY     HYDROCELE EXCISION / REPAIR     NO PAST SURGERIES     PROCTOSCOPY N/A 02/22/2022   Procedure: RIGID PROCTOSCOPY;  Surgeon: Donald Soda, MD;  Location: [facility];  Service: General;  Laterality: N/A;   XI ROBOTIC ASSISTED LOWER ANTERIOR RESECTION  02/22/2022   for diverticulitis/takedown of colovesical fistula   XI ROBOTIC LAPAROSCOPIC ASSISTED APPENDECTOMY  02/22/2022     Current Outpatient Medications  Medication Sig Dispense Refill   allopurinol (ZYLOPRIM) 300 MG tablet Take 1 tablet (300 mg total) by mouth daily. 30 tablet 6   amLODipine (NORVASC) 10 MG tablet Take 1 tablet (10 mg total) by mouth daily. 90 tablet 3   aspirin EC 81 MG tablet Take 1 tablet (81 mg total) by mouth daily.  30 tablet 5   atorvastatin (LIPITOR) 40 MG tablet Take 1 tablet (40 mg total) by mouth daily. For cholesterol 30 tablet 6   empagliflozin (JARDIANCE) 25 MG TABS tablet Take 1 tablet (25 mg total) by mouth daily before breakfast. 90 tablet 1   hydrOXYzine (ATARAX) 25 MG tablet Take 1 tablet (25 mg total) by mouth at bedtime as needed for itching. 60 tablet 1   losartan (COZAAR) 100 MG tablet Take 1 tablet (100 mg total) by mouth daily. 30 tablet 6   metFORMIN (GLUCOPHAGE) 1000 MG tablet Take 1 tablet (1,000 mg total) by mouth 2 (two) times daily with a meal. 180 tablet 3   permethrin (ELIMITE) 5 % cream Apply from head to toe and leave in for 8 to 14 hours and wash off.  May repeat in 72 hours. 60 g 1   Current Facility-Administered Medications  Medication Dose Route Frequency Provider Last Rate Last Admin   0.9 %  sodium chloride infusion  500 mL Intravenous Once Cirigliano, Vito V, DO        Allergies:   Patient has no known allergies.    Social History:  The patient  reports that he has been smoking cigarettes. He has been smoking an average of 1 pack per day. He has never used smokeless tobacco. He reports that he does not currently  use alcohol after a past usage of about 6.0 standard drinks of alcohol per week. He reports that he does not use drugs.   Family History:  The patient's family history includes Diverticulitis in his brother; Leukemia (age of onset: 45) in his mother.    ROS:  Please see the history of present illness.   Otherwise, review of systems are positive for mild DOE; difficulty quitting smoking.   All other systems are reviewed and negative.    PHYSICAL EXAM: VS:  BP 112/62   Pulse 86   Ht 6\' 3"  (1.905 m)   Wt 215 lb (97.5 kg)   SpO2 94%   BMI 26.87 kg/m  , BMI Body mass index is 26.87 kg/m. GEN: Well nourished, well developed, in no acute distress HEENT: normal Neck: no JVD, carotid bruits, or masses Cardiac: RRR; no murmurs, rubs, or gallops,no edema   Respiratory:  clear to auscultation bilaterally, normal work of breathing GI: soft, nontender, nondistended, + BS MS: no deformity or atrophy Skin: warm and dry, no rash Neuro:  Strength and sensation are intact Psych: euthymic mood, full affect   EKG:   The ekg ordered today demonstrates NSR, no ST changes   Recent Labs: 07/25/2021: TSH 3.937 02/14/2022: ALT 11 02/23/2022: BUN 20; Creatinine, Ser 0.74; Hemoglobin 12.2; Magnesium 1.7; Platelets 176; Potassium 4.1; Sodium 137   Lipid Panel    Component Value Date/Time   CHOL 186 07/24/2021 1929   CHOL 215 (H) 01/24/2017 1512   TRIG 158 (H) 07/24/2021 1929   HDL 33 (L) 07/24/2021 1929   HDL 53 01/24/2017 1512   CHOLHDL 5.6 07/24/2021 1929   VLDL 32 07/24/2021 1929   LDLCALC 121 (H) 07/24/2021 1929   LDLCALC Comment 01/24/2017 1512     Other studies Reviewed: Additional studies/ records that were reviewed today with results demonstrating: labs reviewed.   ASSESSMENT AND PLAN:  Coronary artery calcification/hyperlipidemia: Target LDL is 70.  LDL 121 on atorvastatin 40 mg daily in 2023.  Increase atorvastatin to 80 mg daily and see if LDL will decrease.  Improve lifestyle will hopefully prevent coronary calcification from getting worse.  No angina.  We talked about the exercise target noted below. HTN: Avoid excessive salt.  Repeat check by me in the office today showed improved blood pressure. PreDM: Controlled on metformin with A1c of 5.6 in January 2024.  Whole food plant-based diet.  High-fiber diet. avoid processed foods. Aortic atherosclerosis: Noted on the CT scan and should be followed.  No AAA noted.  No family h/o AAA.  Tobacco abuse: Tried nicotine patch to help with stopping smoking.  Willing to try a 21 mg /day patch.   Current medicines are reviewed at length with the patient today.  The patient concerns regarding his medicines were addressed.  The following changes have been made: Increased atorvastatin to 80 mg  daily, start nicotine patch 21 mg/day  Labs/ tests ordered today include: Liver and cholesterol tests in about 3 months No orders of the defined types were placed in this encounter.   Recommend 150 minutes/week of aerobic exercise Low fat, low carb, high fiber diet recommended  Disposition:   FU in 1 year with Dr. Lynnette Caffey   Signed, Lance Muss, MD  05/28/2022 9:29 AM    [facility] [address] [place], [place], [place]  [address] Phone: [phone]; Fax: [phone]

## 2022-06-01 ENCOUNTER — Other Ambulatory Visit: Payer: Self-pay

## 2022-06-01 DIAGNOSIS — D485 Neoplasm of uncertain behavior of skin: Secondary | ICD-10-CM | POA: Diagnosis not present

## 2022-06-21 DIAGNOSIS — D2339 Other benign neoplasm of skin of other parts of face: Secondary | ICD-10-CM | POA: Diagnosis not present

## 2022-06-25 DIAGNOSIS — D2239 Melanocytic nevi of other parts of face: Secondary | ICD-10-CM | POA: Diagnosis not present

## 2022-08-03 DIAGNOSIS — D485 Neoplasm of uncertain behavior of skin: Secondary | ICD-10-CM | POA: Diagnosis not present

## 2022-08-03 DIAGNOSIS — D2239 Melanocytic nevi of other parts of face: Secondary | ICD-10-CM | POA: Diagnosis not present

## 2022-08-13 ENCOUNTER — Other Ambulatory Visit: Payer: Self-pay

## 2022-08-24 ENCOUNTER — Other Ambulatory Visit: Payer: Self-pay

## 2022-08-24 ENCOUNTER — Other Ambulatory Visit: Payer: Self-pay | Admitting: Family Medicine

## 2022-08-24 ENCOUNTER — Other Ambulatory Visit (INDEPENDENT_AMBULATORY_CARE_PROVIDER_SITE_OTHER): Payer: Self-pay | Admitting: Primary Care

## 2022-08-24 DIAGNOSIS — E119 Type 2 diabetes mellitus without complications: Secondary | ICD-10-CM

## 2022-08-24 DIAGNOSIS — M1A0710 Idiopathic chronic gout, right ankle and foot, without tophus (tophi): Secondary | ICD-10-CM

## 2022-08-24 DIAGNOSIS — I152 Hypertension secondary to endocrine disorders: Secondary | ICD-10-CM

## 2022-08-24 MED ORDER — LOSARTAN POTASSIUM 100 MG PO TABS
100.0000 mg | ORAL_TABLET | Freq: Every day | ORAL | 0 refills | Status: DC
Start: 2022-08-24 — End: 2022-10-05
  Filled 2022-08-24 – 2022-08-31 (×2): qty 30, 30d supply, fill #0

## 2022-08-24 MED ORDER — ALLOPURINOL 300 MG PO TABS
300.0000 mg | ORAL_TABLET | Freq: Every day | ORAL | 0 refills | Status: DC
Start: 2022-08-24 — End: 2022-10-05
  Filled 2022-08-24 – 2022-08-31 (×2): qty 30, 30d supply, fill #0

## 2022-08-24 NOTE — Telephone Encounter (Signed)
Will forward to nurse 

## 2022-08-27 ENCOUNTER — Ambulatory Visit: Payer: Medicaid Other | Attending: Family Medicine

## 2022-08-28 ENCOUNTER — Other Ambulatory Visit: Payer: Self-pay

## 2022-08-29 ENCOUNTER — Other Ambulatory Visit: Payer: Self-pay | Admitting: Family Medicine

## 2022-08-29 DIAGNOSIS — E119 Type 2 diabetes mellitus without complications: Secondary | ICD-10-CM

## 2022-08-29 NOTE — Telephone Encounter (Signed)
Medication Refill - Medication:  metFORMIN (GLUCOPHAGE) 1000 MG tablet   Has the patient contacted their pharmacy? Yes.    Preferred Pharmacy (with phone number or street name):  [facility] Phone: [phone]  Fax: [phone]     Has the patient been seen for an appointment in the last year OR does the patient have an upcoming appointment? Yes.    Agent: Please be advised that RX refills may take up to 3 business days. We ask that you follow-up with your pharmacy.

## 2022-08-30 ENCOUNTER — Other Ambulatory Visit: Payer: Self-pay

## 2022-08-30 MED ORDER — METFORMIN HCL 1000 MG PO TABS
1000.0000 mg | ORAL_TABLET | Freq: Two times a day (BID) | ORAL | 0 refills | Status: DC
Start: 2022-08-30 — End: 2022-10-08
  Filled 2022-08-30: qty 180, 90d supply, fill #0

## 2022-08-30 NOTE — Telephone Encounter (Signed)
Requested Prescriptions  Pending Prescriptions Disp Refills   metFORMIN (GLUCOPHAGE) 1000 MG tablet 180 tablet 0    Sig: Take 1 tablet (1,000 mg total) by mouth 2 (two) times daily with a meal.     Endocrinology:  Diabetes - Biguanides Failed - 08/29/2022 12:54 PM      Failed - HBA1C is between 0 and 7.9 and within 180 days    HbA1c, POC (controlled diabetic range)  Date Value Ref Range Status  12/11/2021 5.5 0.0 - 7.0 % Final   Hgb A1c MFr Bld  Date Value Ref Range Status  02/14/2022 5.6 4.8 - 5.6 % Final    Comment:    (NOTE) Pre diabetes:          5.7%-6.4%  Diabetes:              >6.4%  Glycemic control for   <7.0% adults with diabetes          Passed - Cr in normal range and within 360 days    Creat  Date Value Ref Range Status  11/21/2015 0.64 (L) 0.70 - 1.33 mg/dL Final    Comment:      For patients > or = 62 years of age: The upper reference limit for Creatinine is approximately 13% higher for people identified as African-American.      Creatinine, Ser  Date Value Ref Range Status  02/23/2022 0.74 0.61 - 1.24 mg/dL Final   Creatinine, Urine  Date Value Ref Range Status  11/21/2015 166 20 - 370 mg/dL Final         Passed - eGFR in normal range and within 360 days    GFR, Est African American  Date Value Ref Range Status  11/21/2015 >89 >=60 mL/min Final   GFR calc Af Amer  Date Value Ref Range Status  01/24/2017 136 >59 mL/min/1.73 Final   GFR, Est Non African American  Date Value Ref Range Status  11/21/2015 >89 >=60 mL/min Final   GFR, Estimated  Date Value Ref Range Status  02/23/2022 >60 >60 mL/min Final    Comment:    (NOTE) Calculated using the CKD-EPI Creatinine Equation (2021)    eGFR  Date Value Ref Range Status  06/02/2020 64 >59 mL/min/1.73 Final         Passed - B12 Level in normal range and within 720 days    Vitamin B-12  Date Value Ref Range Status  12/01/2021 187 180 - 914 pg/mL Final    Comment:    (NOTE) This assay  is not validated for testing neonatal or myeloproliferative syndrome specimens for Vitamin B12 levels. Performed at [facility], [address], [place], [place] [address]          Passed - Valid encounter within last 6 months    Recent Outpatient Visits           5 months ago Smoking greater than 20 pack years   [place] [facility] [place], [place], MD   8 months ago Type 2 diabetes mellitus with other specified complication, without long-term current use of insulin [facility])   [place] [facility] Hoy Register, MD   1 year ago Tobacco abuse   [employer] [facility] Grayce Sessions, NP   1 year ago Type 2 diabetes mellitus with other specified complication, without long-term current use of insulin [facility])   [place] [facility] Hoy Register, MD  2 years ago Type 2 diabetes mellitus with other specified complication, without long-term current use of insulin (HCC)   [place] [facility] [place], Odette Horns, MD       Future Appointments             In 3 weeks Hoy Register, MD [facility]            Passed - CBC within normal limits and completed in the last 12 months    WBC  Date Value Ref Range Status  02/23/2022 10.9 (H) 4.0 - 10.5 K/uL Final   RBC  Date Value Ref Range Status  02/23/2022 4.23 4.22 - 5.81 MIL/uL Final   Hemoglobin  Date Value Ref Range Status  02/23/2022 12.2 (L) 13.0 - 17.0 g/dL Final   HCT  Date Value Ref Range Status  02/23/2022 38.0 (L) 39.0 - 52.0 % Final   MCHC  Date Value Ref Range Status  02/23/2022 32.1 30.0 - 36.0 g/dL Final   [employer]  Date Value Ref Range Status  02/23/2022 28.8 26.0 - 34.0 pg Final   MCV  Date Value Ref Range Status  02/23/2022 89.8 80.0 - 100.0 fL Final   No results found for: "PLTCOUNTKUC", "LABPLAT", "POCPLA" RDW  Date Value Ref Range Status   02/23/2022 16.3 (H) 11.5 - 15.5 % Final

## 2022-08-31 ENCOUNTER — Other Ambulatory Visit: Payer: Self-pay

## 2022-09-06 ENCOUNTER — Institutional Professional Consult (permissible substitution): Payer: Medicaid Other | Admitting: Plastic Surgery

## 2022-09-10 ENCOUNTER — Other Ambulatory Visit: Payer: Self-pay

## 2022-09-24 ENCOUNTER — Encounter: Payer: Self-pay | Admitting: Family Medicine

## 2022-09-24 ENCOUNTER — Ambulatory Visit: Payer: Medicaid Other | Attending: Family Medicine | Admitting: Family Medicine

## 2022-09-24 VITALS — BP 138/81 | HR 92 | Ht 75.0 in | Wt 228.0 lb

## 2022-09-24 DIAGNOSIS — E1159 Type 2 diabetes mellitus with other circulatory complications: Secondary | ICD-10-CM | POA: Diagnosis not present

## 2022-09-24 DIAGNOSIS — E559 Vitamin D deficiency, unspecified: Secondary | ICD-10-CM | POA: Diagnosis not present

## 2022-09-24 DIAGNOSIS — E119 Type 2 diabetes mellitus without complications: Secondary | ICD-10-CM | POA: Diagnosis not present

## 2022-09-24 DIAGNOSIS — M7501 Adhesive capsulitis of right shoulder: Secondary | ICD-10-CM

## 2022-09-24 DIAGNOSIS — M1A0710 Idiopathic chronic gout, right ankle and foot, without tophus (tophi): Secondary | ICD-10-CM | POA: Diagnosis not present

## 2022-09-24 DIAGNOSIS — Z7984 Long term (current) use of oral hypoglycemic drugs: Secondary | ICD-10-CM

## 2022-09-24 DIAGNOSIS — E1169 Type 2 diabetes mellitus with other specified complication: Secondary | ICD-10-CM | POA: Diagnosis not present

## 2022-09-24 DIAGNOSIS — Z23 Encounter for immunization: Secondary | ICD-10-CM

## 2022-09-24 DIAGNOSIS — M7502 Adhesive capsulitis of left shoulder: Secondary | ICD-10-CM

## 2022-09-24 DIAGNOSIS — I152 Hypertension secondary to endocrine disorders: Secondary | ICD-10-CM

## 2022-09-24 DIAGNOSIS — R5383 Other fatigue: Secondary | ICD-10-CM | POA: Diagnosis not present

## 2022-09-24 LAB — POCT GLYCOSYLATED HEMOGLOBIN (HGB A1C): HbA1c, POC (controlled diabetic range): 6.8 % (ref 0.0–7.0)

## 2022-09-24 NOTE — Patient Instructions (Signed)
Adhesive Capsulitis  Adhesive capsulitis, also called frozen shoulder, causes the shoulder to become stiff and painful to move. This condition happens when there is inflammation of the tendons and ligaments that surround the shoulder joint (shoulder capsule). Tendons are tissues that connect muscle to bone. Ligaments are tissues that connect bones to each other. What are the causes? This condition may be caused by: An injury to your shoulder joint. Straining your shoulder. Not moving your shoulder for a period of time. This can happen if your arm was injured or in a sling. In some cases, the cause is not known. What increases the risk? You are more likely to develop this condition if: You are male. You are older than 62 years of age. You have certain other conditions, such as: Diabetes. Thyroid problems. Stroke. You recently had surgery, especially shoulder or neck surgery. What are the signs or symptoms? Symptoms of this condition include: Pain in your shoulder when you move your arm. There may also be pain when parts of your shoulder are touched. The pain may be worse at night or when you are resting. Not being able to move your shoulder normally. Sudden muscle tightening (muscle spasms). How is this diagnosed? This condition is diagnosed with a physical exam and imaging tests, such as an X-ray or MRI. How is this treated? This condition may be treated with: Treatment of the injury or condition that caused the adhesive capsulitis. Medicines for pain, inflammation, or muscle spasms. Injections of medicine (steroids) into the shoulder joint. Physical therapy. This involves doing exercises to get the shoulder moving again. Shoulder manipulation. This is a procedure to move the shoulder into another position. It is done after you are given a medicine to make you fall asleep (general anesthesia). The joint may also be injected with salt water at high pressure to break down  scarring. Surgery. This may be done in severe cases when other treatments do not work. Some less common treatments include: Injection of hyaluronic acid into the shoulder joint. This substance is a normal part of the fluid inside joints. It helps lubricate the joint and can lower inflammation. Injection of platelet-rich plasma into the shoulder joint. This uses a type of blood cell from your own blood that may speed up the healing process. Sending energy waves to the affected area (extracorporeal shock wave therapy). Most people recover completely from adhesive capsulitis, but some may not get back full shoulder movement. Follow these instructions at home: Managing pain, stiffness, and swelling     If told, put ice on the injured area. Put ice in a plastic bag. Place a towel between your skin and the bag. Leave the ice on for 20 minutes, 2-3 times a day. If told, apply heat to the affected area before you exercise. Use the heat source that your health care provider recommends, such as a moist heat pack or a heating pad. Place a towel between your skin and the heat source. Leave the heat on for 20-30 minutes. If your skin turns bright red, remove the ice or heat right away to prevent skin damage. The risk of damage is higher if you cannot feel pain, heat, or cold. General instructions Take over-the-counter and prescription medicines only as told by your provider. If you are being treated with physical therapy, do exercises as told. Avoid activities or exercises that put a lot of demand on your shoulder, such as throwing. These can make pain worse. Contact a health care provider if: You have  new symptoms. Your symptoms get worse. This information is not intended to replace advice given to you by your health care provider. Make sure you discuss any questions you have with your health care provider. Document Revised: 10/30/2021 Document Reviewed: 10/30/2021 Elsevier Patient Education  2024  [employer].

## 2022-09-24 NOTE — Progress Notes (Signed)
Subjective:  Patient ID: Donald Bender, male    DOB: 1960/03/12  Age: 62 y.o. MRN: [id number]  CC: Medical Management of Chronic Issues (No questions or concerns.)   HPI Donald Bender is a 62 y.o. year old male with a history of type 2 diabetes mellitus (A1c 5.6), hypertension, polycythemia, aortic atherosclerosis, coronary artery calcifications tobacco abuse (2-3 packs of cigarettes/day for close to 50 years)     Interval History: Discussed the use of AI scribe software for clinical note transcription with the patient, who gave verbal consent to proceed.  He presents with low energy and bilateral shoulder pain. He reports a lack of motivation to complete tasks around the house and has to force himself to mow his small yard, often needing to take breaks. This has been ongoing for approximately two to three years. He has a history of low testosterone in the past.  In addition to the low energy, he has been experiencing bilateral shoulder pain, which he rates as severe. The pain is particularly noticeable when reaching out or up. He has not previously had physical therapy for this issue.  The patient continues to smoke and has recently started drinking coffee with sugar and creamer. He has an increase in his A1c from 5.6 to 6.8 and suspects that the addition of coffee to his diet may be contributing to this. He expresses a desire to quit smoking but feels that he needs to address his low energy levels first.      He had a visit with his cardiologist in 04/2022 for follow-up of coronary artery calcifications.  Past Medical History:  Diagnosis Date   Acute respiratory failure with hypoxia (HCC) 07/25/2021   Arthritis    osteoarthritis and gout   Diabetes mellitus without complication (HCC)    Diverticulitis    Hypertension     Past Surgical History:  Procedure Laterality Date   COLONOSCOPY     HYDROCELE EXCISION / REPAIR     NO PAST SURGERIES     PROCTOSCOPY N/A 02/22/2022    Procedure: RIGID PROCTOSCOPY;  Surgeon: Karie Soda, MD;  Location: [facility];  Service: General;  Laterality: N/A;   XI ROBOTIC ASSISTED LOWER ANTERIOR RESECTION  02/22/2022   for diverticulitis/takedown of colovesical fistula   XI ROBOTIC LAPAROSCOPIC ASSISTED APPENDECTOMY  02/22/2022    Family History  Problem Relation Age of Onset   Leukemia Mother 40   Diverticulitis Brother    Colon cancer Neg Hx    Stomach cancer Neg Hx    Esophageal cancer Neg Hx    Colon polyps Neg Hx     Social History   Socioeconomic History   Marital status: Married    Spouse name: Not on file   Number of children: 0   Years of education: Not on file   Highest education level: Not on file  Occupational History   Occupation: Holiday representative  Tobacco Use   Smoking status: Every Day    Current packs/day: 1.00    Types: Cigarettes   Smokeless tobacco: Never   Tobacco comments:    Smoking 1 ppd  Vaping Use   Vaping status: Never Used  Substance and Sexual Activity   Alcohol use: Not Currently    Alcohol/week: 6.0 standard drinks of alcohol    Types: 6 Cans of beer per week    Comment: quit drinking   Drug use: No   Sexual activity: Yes  Other Topics Concern   Not on file  Social History  Narrative   Not on file   Social Determinants of Health   Financial Resource Strain: Not on file  Food Insecurity: No Food Insecurity (02/22/2022)   Hunger Vital Sign    Worried About Running Out of Food in the Last Year: Never true    Ran Out of Food in the Last Year: Never true  Transportation Needs: No Transportation Needs (02/22/2022)   PRAPARE - Administrator, Civil Service (Medical): No    Lack of Transportation (Non-Medical): No  Physical Activity: Not on file  Stress: Not on file  Social Connections: Not on file    No Known Allergies  Outpatient Medications Prior to Visit  Medication Sig Dispense Refill   allopurinol (ZYLOPRIM) 300 MG tablet Take 1 tablet (300 mg total) by mouth  daily. 30 tablet 0   amLODipine (NORVASC) 10 MG tablet Take 1 tablet (10 mg total) by mouth daily. 90 tablet 3   aspirin EC 81 MG tablet Take 1 tablet (81 mg total) by mouth daily. 30 tablet 5   atorvastatin (LIPITOR) 80 MG tablet Take 1 tablet (80 mg total) by mouth daily. 90 tablet 3   empagliflozin (JARDIANCE) 25 MG TABS tablet Take 1 tablet (25 mg total) by mouth daily before breakfast. 90 tablet 1   hydrOXYzine (ATARAX) 25 MG tablet Take 1 tablet (25 mg total) by mouth at bedtime as needed for itching. 60 tablet 1   losartan (COZAAR) 100 MG tablet Take 1 tablet (100 mg total) by mouth daily. 30 tablet 0   metFORMIN (GLUCOPHAGE) 1000 MG tablet Take 1 tablet (1,000 mg total) by mouth 2 (two) times daily with a meal. 180 tablet 0   nicotine (NICODERM CQ - DOSED IN MG/24 HOURS) 21 mg/24hr patch Place 1 patch (21 mg total) onto the skin daily. 28 patch 2   permethrin (ELIMITE) 5 % cream Apply from head to toe and leave in for 8 to 14 hours and wash off.  May repeat in 72 hours. 60 g 1   Facility-Administered Medications Prior to Visit  Medication Dose Route Frequency Provider Last Rate Last Admin   0.9 %  sodium chloride infusion  500 mL Intravenous Once Cirigliano, Vito V, DO         ROS Review of Systems  Constitutional:  Positive for fatigue. Negative for activity change and appetite change.  HENT:  Negative for sinus pressure and sore throat.   Respiratory:  Negative for chest tightness, shortness of breath and wheezing.   Cardiovascular:  Negative for chest pain and palpitations.  Gastrointestinal:  Negative for abdominal distention, abdominal pain and constipation.  Genitourinary: Negative.   Musculoskeletal: Negative.        See HPI  Psychiatric/Behavioral:  Negative for behavioral problems and dysphoric mood.     Objective:  BP 138/81   Pulse 92   Ht 6\' 3"  (1.905 m)   Wt 228 lb (103.4 kg)   SpO2 99%   BMI 28.50 kg/m      09/24/2022    2:09 PM 09/24/2022    1:44 PM  05/28/2022    9:29 AM  BP/Weight  Systolic BP 138 156 112  Diastolic BP 81 83 62  Wt. (Lbs)  228   BMI  28.5 kg/m2       Physical Exam Constitutional:      Appearance: He is well-developed.  Cardiovascular:     Rate and Rhythm: Normal rate.     Heart sounds: Normal heart sounds. No murmur  heard. Pulmonary:     Effort: Pulmonary effort is normal.     Breath sounds: Normal breath sounds. No wheezing or rales.  Chest:     Chest wall: No tenderness.  Abdominal:     General: Bowel sounds are normal. There is no distension.     Palpations: Abdomen is soft. There is no mass.     Tenderness: There is no abdominal tenderness.  Musculoskeletal:     Right shoulder: Tenderness present. Decreased range of motion.     Left shoulder: Tenderness present. Decreased range of motion.     Right lower leg: No edema.     Left lower leg: No edema.  Neurological:     Mental Status: He is alert and oriented to person, place, and time.  Psychiatric:        Mood and Affect: Mood normal.        Latest Ref Rng & Units 02/23/2022    4:40 AM 02/14/2022    2:30 PM 12/05/2021    3:06 AM  CMP  Glucose 70 - 99 mg/dL 604  540  981   BUN 8 - 23 mg/dL 20  13  8    Creatinine 0.61 - 1.24 mg/dL 1.91  4.78  2.95   Sodium 135 - 145 mmol/L 137  137  137   Potassium 3.5 - 5.1 mmol/L 4.1  4.3  3.7   Chloride 98 - 111 mmol/L 105  103  102   CO2 22 - 32 mmol/L 23  26  26    Calcium 8.9 - 10.3 mg/dL 8.8  9.4  8.5   Total Protein 6.5 - 8.1 g/dL  8.0    Total Bilirubin 0.3 - 1.2 mg/dL  0.2    Alkaline Phos 38 - 126 U/L  51    AST 15 - 41 U/L  11    ALT 0 - 44 U/L  11      Lipid Panel     Component Value Date/Time   CHOL 186 07/24/2021 1929   CHOL 215 (H) 01/24/2017 1512   TRIG 158 (H) 07/24/2021 1929   HDL 33 (L) 07/24/2021 1929   HDL 53 01/24/2017 1512   CHOLHDL 5.6 07/24/2021 1929   VLDL 32 07/24/2021 1929   LDLCALC 121 (H) 07/24/2021 1929   LDLCALC Comment 01/24/2017 1512    CBC    Component  Value Date/Time   WBC 10.9 (H) 02/23/2022 0440   RBC 4.23 02/23/2022 0440   HGB 12.2 (L) 02/23/2022 0440   HCT 38.0 (L) 02/23/2022 0440   PLT 176 02/23/2022 0440   MCV 89.8 02/23/2022 0440   MCH 28.8 02/23/2022 0440   MCHC 32.1 02/23/2022 0440   RDW 16.3 (H) 02/23/2022 0440   LYMPHSABS 1.6 11/30/2021 1858   MONOABS 1.6 (H) 11/30/2021 1858   EOSABS 0.0 11/30/2021 1858   BASOSABS 0.1 11/30/2021 1858    Lab Results  Component Value Date   HGBA1C 6.8 09/24/2022    Lab Results  Component Value Date   TSH 3.937 07/25/2021    Assessment & Plan:      Fatigue Chronic fatigue for 2-3 years. No known cause. Discussed potential causes including anemia, low testosterone, and hypothyroidism. -Order labs to check for anemia, testosterone levels, and thyroid function.  Type 2 Diabetes Mellitus A1c increased from 5.6 to 6.8. Patient recently started drinking coffee with sugar and creamer. -Continue Metformin 1 tablet twice daily. -Advise patient to cut back on sugar and monitor diet. -Counseled on Diabetic diet,  my plate method, 621 minutes of moderate intensity exercise/week Blood sugar logs with fasting goals of 80-120 mg/dl, random of less than 308 and in the event of sugars less than 60 mg/dl or greater than 657 mg/dl encouraged to notify the clinic. Advised on the need for annual eye exams, annual foot exams, Pneumonia vaccine.   Hypertension Blood pressure elevated at 156/83 today, likely due to recent smoking. Patient's blood pressure was well controlled at last visit (112/62). -Recheck blood pressure today after patient has rested. -Repeat is normal at 138/81 -Continue Losartan.  Hyperlipidemia Patient has known cholesterol deposits and calcifications in the arteries around the heart. No changes in medication by cardiologist. -Continue Atorvastatin  Tobacco Use Patient continues to smoke. Provided patches for smoking cessation but patient is not ready to quit. -Encourage  patient to quit smoking.  Adhesive capsulitis Chronic bilateral shoulder pain. Possible arthritis or adhesive capsulitis. Pain worsens with reaching out/up. -Refer to physical therapy for shoulder pain. -Advise patient to use over-the-counter ibuprofen for pain relief. -If physical therapy is not effective, consider referral to orthopedics for possible cortisone injections.  General Health Maintenance -Refer for annual diabetic eye exam. -Recheck A1c in 3 months to monitor diabetes control.          No orders of the defined types were placed in this encounter.   Follow-up: Return in about 6 months (around 03/27/2023) for Chronic medical conditions.       Hoy Register, MD, FAAFP. [facility] [place], [place] [phone]   09/24/2022, 2:24 PM

## 2022-09-25 LAB — CMP14+EGFR
ALT: 12 IU/L (ref 0–44)
AST: 10 IU/L (ref 0–40)
Albumin: 4.3 g/dL (ref 3.9–4.9)
Alkaline Phosphatase: 76 IU/L (ref 44–121)
BUN/Creatinine Ratio: 22 (ref 10–24)
BUN: 15 mg/dL (ref 8–27)
Bilirubin Total: 0.2 mg/dL (ref 0.0–1.2)
CO2: 23 mmol/L (ref 20–29)
Calcium: 9.9 mg/dL (ref 8.6–10.2)
Chloride: 102 mmol/L (ref 96–106)
Creatinine, Ser: 0.69 mg/dL — ABNORMAL LOW (ref 0.76–1.27)
Globulin, Total: 2.7 g/dL (ref 1.5–4.5)
Glucose: 125 mg/dL — ABNORMAL HIGH (ref 70–99)
Potassium: 4.6 mmol/L (ref 3.5–5.2)
Sodium: 140 mmol/L (ref 134–144)
Total Protein: 7 g/dL (ref 6.0–8.5)
eGFR: 105 mL/min/{1.73_m2} (ref >59)

## 2022-09-25 LAB — T4, FREE: Free T4: 1.23 ng/dL (ref 0.82–1.77)

## 2022-09-25 LAB — TESTOSTERONE: Testosterone: 145 ng/dL — ABNORMAL LOW (ref 264–916)

## 2022-09-25 LAB — VITAMIN D 25 HYDROXY (VIT D DEFICIENCY, FRACTURES): Vit D, 25-Hydroxy: 13.8 ng/mL — ABNORMAL LOW (ref 30.0–100.0)

## 2022-09-25 LAB — T3: T3, Total: 108 ng/dL (ref 71–180)

## 2022-09-25 LAB — TSH: TSH: 2.94 u[IU]/mL (ref 0.450–4.500)

## 2022-09-26 ENCOUNTER — Other Ambulatory Visit: Payer: Self-pay

## 2022-09-26 ENCOUNTER — Other Ambulatory Visit: Payer: Self-pay | Admitting: Family Medicine

## 2022-09-26 MED ORDER — ERGOCALCIFEROL 1.25 MG (50000 UT) PO CAPS
50000.0000 [IU] | ORAL_CAPSULE | ORAL | 1 refills | Status: DC
  Filled 2022-09-26: qty 12, 84d supply, fill #0
  Filled 2023-01-07: qty 12, 84d supply, fill #1

## 2022-09-27 ENCOUNTER — Encounter: Payer: Self-pay | Admitting: Plastic Surgery

## 2022-09-27 ENCOUNTER — Ambulatory Visit (INDEPENDENT_AMBULATORY_CARE_PROVIDER_SITE_OTHER): Payer: Medicaid Other | Admitting: Plastic Surgery

## 2022-09-27 VITALS — BP 154/82 | HR 89 | Ht 75.0 in | Wt 225.0 lb

## 2022-09-27 DIAGNOSIS — L711 Rhinophyma: Secondary | ICD-10-CM | POA: Diagnosis not present

## 2022-09-27 NOTE — Progress Notes (Signed)
Referring Provider Hoy Register, MD [address] [place],  [place] [address]   CC:  Chief Complaint  Patient presents with   Consult      Donald Bender is an 62 y.o. male.  HPI: Mr. 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  He states that he has had the rhinophyma now for approximately 10 years and has been slowly increasing in size during that period of time.  He admits to profound social awkwardness due to the size of the lesions on the nose.  He would like to have the area treated if possible  No Known Allergies  Outpatient Encounter Medications as of 09/27/2022  Medication Sig   allopurinol (ZYLOPRIM) 300 MG tablet Take 1 tablet (300 mg total) by mouth daily.   amLODipine (NORVASC) 10 MG tablet Take 1 tablet (10 mg total) by mouth daily.   aspirin EC 81 MG tablet Take 1 tablet (81 mg total) by mouth daily.   atorvastatin (LIPITOR) 80 MG tablet Take 1 tablet (80 mg total) by mouth daily.   empagliflozin (JARDIANCE) 25 MG TABS tablet Take 1 tablet (25 mg total) by mouth daily before breakfast.   ergocalciferol (DRISDOL) 1.25 MG (50000 UT) capsule Take 1 capsule (50,000 Units total) by mouth once a week.   hydrOXYzine (ATARAX) 25 MG tablet Take 1 tablet (25 mg total) by mouth at bedtime as needed for itching.   losartan (COZAAR) 100 MG tablet Take 1 tablet (100 mg total) by mouth daily.   metFORMIN (GLUCOPHAGE) 1000 MG tablet Take 1 tablet (1,000 mg total) by mouth 2 (two) times daily with a meal.   nicotine (NICODERM CQ - DOSED IN MG/24 HOURS) 21 mg/24hr patch Place 1 patch (21 mg total) onto the skin daily.   permethrin (ELIMITE) 5 % cream Apply from head to toe and leave in for 8 to 14 hours and wash off.  May repeat in 72 hours.   Facility-Administered Encounter Medications as of 09/27/2022  Medication   0.9 %  sodium chloride infusion     Past Medical History:  Diagnosis Date   Acute  respiratory failure with hypoxia (HCC) 07/25/2021   Arthritis    osteoarthritis and gout   Diabetes mellitus without complication (HCC)    Diverticulitis    Hypertension     Past Surgical History:  Procedure Laterality Date   COLONOSCOPY     HYDROCELE EXCISION / REPAIR     NO PAST SURGERIES     PROCTOSCOPY N/A 02/22/2022   Procedure: RIGID PROCTOSCOPY;  Surgeon: Karie Soda, MD;  Location: [facility];  Service: General;  Laterality: N/A;   XI ROBOTIC ASSISTED LOWER ANTERIOR RESECTION  02/22/2022   for diverticulitis/takedown of colovesical fistula   XI ROBOTIC LAPAROSCOPIC ASSISTED APPENDECTOMY  02/22/2022    Family History  Problem Relation Age of Onset   Leukemia Mother 76   Diverticulitis Brother    Colon cancer Neg Hx    Stomach cancer Neg Hx    Esophageal cancer Neg Hx    Colon polyps Neg Hx     Social History   Social History Narrative   Not on file     Review of Systems General: Denies fevers, chills, weight loss CV: Denies chest pain, shortness of breath, palpitations Skin: Extensive growths on the face and nose  Physical Exam    09/27/2022   12:53 PM 09/24/2022    2:09 PM  09/24/2022    1:44 PM  Vitals with BMI  Height 6\' 3"   6\' 3"   Weight 225 lbs  228 lbs  BMI 28.12  28.5  Systolic 154 138 161  Diastolic 82 81 83  Pulse 89  92    General:  No acute distress,  Alert and oriented, Non-Toxic, Normal speech and affect Integument: Extensive rosacea across the bilateral cheeks and forehead and rhinophyma of the nose almost completely obliterating the normal anatomy   Assessment/Plan Rhinophyma: Patient will require extensive resection of the rhinophyma to obtain any type of normal appearance to his nose.  We discussed this at length.  After discussion with my partner the most reasonable option would be laser therapy for ablation of the tissue.  This likely will not be approved through his insurance carrier.  Will discuss with him how he would like to  proceed.  Santiago Glad 09/27/2022, 1:33 PM

## 2022-09-28 ENCOUNTER — Other Ambulatory Visit: Payer: Self-pay

## 2022-10-04 ENCOUNTER — Ambulatory Visit (INDEPENDENT_AMBULATORY_CARE_PROVIDER_SITE_OTHER): Payer: Medicaid Other | Admitting: Plastic Surgery

## 2022-10-04 DIAGNOSIS — L711 Rhinophyma: Secondary | ICD-10-CM

## 2022-10-05 ENCOUNTER — Other Ambulatory Visit: Payer: Self-pay

## 2022-10-05 ENCOUNTER — Other Ambulatory Visit: Payer: Self-pay | Admitting: Family Medicine

## 2022-10-05 DIAGNOSIS — M1A0710 Idiopathic chronic gout, right ankle and foot, without tophus (tophi): Secondary | ICD-10-CM

## 2022-10-05 DIAGNOSIS — E1159 Type 2 diabetes mellitus with other circulatory complications: Secondary | ICD-10-CM

## 2022-10-05 MED ORDER — LOSARTAN POTASSIUM 100 MG PO TABS
100.0000 mg | ORAL_TABLET | Freq: Every day | ORAL | 0 refills | Status: DC
Start: 2022-10-05 — End: 2022-10-08
  Filled 2022-10-05: qty 90, 90d supply, fill #0

## 2022-10-05 MED ORDER — ALLOPURINOL 300 MG PO TABS
300.0000 mg | ORAL_TABLET | Freq: Every day | ORAL | 0 refills | Status: DC
Start: 2022-10-05 — End: 2022-10-08
  Filled 2022-10-05: qty 90, 90d supply, fill #0

## 2022-10-06 ENCOUNTER — Other Ambulatory Visit: Payer: Self-pay

## 2022-10-08 ENCOUNTER — Other Ambulatory Visit: Payer: Self-pay

## 2022-10-08 ENCOUNTER — Telehealth (HOSPITAL_BASED_OUTPATIENT_CLINIC_OR_DEPARTMENT_OTHER): Payer: Medicaid Other | Admitting: Family Medicine

## 2022-10-08 DIAGNOSIS — M1A0710 Idiopathic chronic gout, right ankle and foot, without tophus (tophi): Secondary | ICD-10-CM

## 2022-10-08 DIAGNOSIS — E1159 Type 2 diabetes mellitus with other circulatory complications: Secondary | ICD-10-CM | POA: Diagnosis not present

## 2022-10-08 DIAGNOSIS — Z7984 Long term (current) use of oral hypoglycemic drugs: Secondary | ICD-10-CM

## 2022-10-08 DIAGNOSIS — L299 Pruritus, unspecified: Secondary | ICD-10-CM | POA: Diagnosis not present

## 2022-10-08 DIAGNOSIS — E119 Type 2 diabetes mellitus without complications: Secondary | ICD-10-CM

## 2022-10-08 DIAGNOSIS — I152 Hypertension secondary to endocrine disorders: Secondary | ICD-10-CM | POA: Diagnosis not present

## 2022-10-08 DIAGNOSIS — E291 Testicular hypofunction: Secondary | ICD-10-CM | POA: Diagnosis not present

## 2022-10-08 MED ORDER — LOSARTAN POTASSIUM 100 MG PO TABS
100.0000 mg | ORAL_TABLET | Freq: Every day | ORAL | 1 refills | Status: DC
Start: 2022-10-08 — End: 2023-03-19
  Filled 2022-10-08: qty 90, 90d supply, fill #0
  Filled 2023-01-22: qty 90, 90d supply, fill #1

## 2022-10-08 MED ORDER — ALLOPURINOL 300 MG PO TABS
300.0000 mg | ORAL_TABLET | Freq: Every day | ORAL | 1 refills | Status: DC
  Filled 2022-10-08: qty 90, 90d supply, fill #0
  Filled 2023-01-22: qty 90, 90d supply, fill #1

## 2022-10-08 MED ORDER — HYDROXYZINE HCL 25 MG PO TABS: 25.0000 mg | ORAL_TABLET | Freq: Every evening | ORAL | 1 refills | Status: DC | PRN

## 2022-10-08 MED ORDER — TESTOSTERONE CYPIONATE 200 MG/ML IM SOLN
200.0000 mg | INTRAMUSCULAR | 5 refills | Status: DC
  Filled 2022-10-08: qty 2, fill #0
  Filled 2022-10-09: qty 1, 28d supply, fill #0

## 2022-10-08 MED ORDER — METFORMIN HCL 1000 MG PO TABS
1000.0000 mg | ORAL_TABLET | Freq: Two times a day (BID) | ORAL | 1 refills | Status: DC
Start: 2022-10-08 — End: 2023-03-19
  Filled 2022-10-08 – 2023-01-07 (×2): qty 180, 90d supply, fill #0

## 2022-10-08 NOTE — Progress Notes (Signed)
Virtual Visit via Video Note  I connected with Donald Bender, on 10/08/2022 at 2:45 PM by video enabled telemedicine device and verified that I am speaking with the correct person using two identifiers.   Consent: I discussed the limitations, risks, security and privacy concerns of performing an evaluation and management service by telemedicine and the availability of in person appointments. I also discussed with the patient that there may be a patient responsible charge related to this service. The patient expressed understanding and agreed to proceed.   Location of Patient: Home  Location of Provider: Clinic   Persons participating in Telemedicine visit: Dareen Piano Dr. Alvis Lemmings     History of Present Illness: Donald Bender is a 62 y.o. year old male  with a history of type 2 diabetes mellitus (A1c 5.6), hypertension, polycythemia, aortic atherosclerosis, coronary artery calcifications tobacco abuse (2-3 packs of cigarettes/day for close to 50 years)    Discussed the use of AI scribe software for clinical note transcription with the patient, who gave verbal consent to proceed.   The patient presents for discussion of testosterone replacement therapy due to symptoms of fatigue and decreased energy. He reports having to 'make myself do stuff.' He has a recent blood test confirming low testosterone levels of 145. He also reports being low on several of his medications, including losartan, allopurinol, metformin, and hydroxyzine.      Past Medical History:  Diagnosis Date   Acute respiratory failure with hypoxia (HCC) 07/25/2021   Arthritis    osteoarthritis and gout   Diabetes mellitus without complication (HCC)    Diverticulitis    Hypertension    No Known Allergies  Current Outpatient Medications on File Prior to Visit  Medication Sig Dispense Refill   amLODipine (NORVASC) 10 MG tablet Take 1 tablet (10 mg total) by mouth daily. 90 tablet 3   aspirin EC 81 MG tablet  Take 1 tablet (81 mg total) by mouth daily. 30 tablet 5   atorvastatin (LIPITOR) 80 MG tablet Take 1 tablet (80 mg total) by mouth daily. 90 tablet 3   empagliflozin (JARDIANCE) 25 MG TABS tablet Take 1 tablet (25 mg total) by mouth daily before breakfast. 90 tablet 1   ergocalciferol (DRISDOL) 1.25 MG (50000 UT) capsule Take 1 capsule (50,000 Units total) by mouth once a week. 12 capsule 1   nicotine (NICODERM CQ - DOSED IN MG/24 HOURS) 21 mg/24hr patch Place 1 patch (21 mg total) onto the skin daily. 28 patch 2   permethrin (ELIMITE) 5 % cream Apply from head to toe and leave in for 8 to 14 hours and wash off.  May repeat in 72 hours. 60 g 1   Current Facility-Administered Medications on File Prior to Visit  Medication Dose Route Frequency Provider Last Rate Last Admin   0.9 %  sodium chloride infusion  500 mL Intravenous Once Cirigliano, Vito V, DO        ROS: See HPI  Observations/Objective: Awake, alert, oriented x3 Not in acute distress Rhinophyma Normal mood      Latest Ref Rng & Units 09/24/2022    2:20 PM 02/23/2022    4:40 AM 02/14/2022    2:30 PM  CMP  Glucose 70 - 99 mg/dL 604  540  981   BUN 8 - 27 mg/dL 15  20  13    Creatinine 0.76 - 1.27 mg/dL 1.91  4.78  2.95   Sodium 134 - 144 mmol/L 140  137  137  Potassium 3.5 - 5.2 mmol/L 4.6  4.1  4.3   Chloride 96 - 106 mmol/L 102  105  103   CO2 20 - 29 mmol/L 23  23  26    Calcium 8.6 - 10.2 mg/dL 9.9  8.8  9.4   Total Protein 6.0 - 8.5 g/dL 7.0   8.0   Total Bilirubin 0.0 - 1.2 mg/dL 0.2   0.2   Alkaline Phos 44 - 121 IU/L 76   51   AST 0 - 40 IU/L 10   11   ALT 0 - 44 IU/L 12   11     Lipid Panel     Component Value Date/Time   CHOL 186 07/24/2021 1929   CHOL 215 (H) 01/24/2017 1512   TRIG 158 (H) 07/24/2021 1929   HDL 33 (L) 07/24/2021 1929   HDL 53 01/24/2017 1512   CHOLHDL 5.6 07/24/2021 1929   VLDL 32 07/24/2021 1929   LDLCALC 121 (H) 07/24/2021 1929   LDLCALC Comment 01/24/2017 1512   LABVLDL Comment  01/24/2017 1512    Lab Results  Component Value Date   HGBA1C 6.8 09/24/2022     Assessment and Plan:     Testosterone Deficiency/hypogonadism Low testosterone level with symptoms of fatigue and decreased energy. Discussed risks and benefits of testosterone replacement therapy, including increased risk of prostate cancer and polycythemia. Patient opted for intramuscular injections. -Order baseline Prostate Specific Antigen (PSA) before starting therapy. -Send prescription for testosterone injections to [facility] pharmacy. -Schedule nurse visit for administration of injections. -Plan to monitor PSA and complete blood count (CBC) routinely while on therapy.  Medication Refills Patient reported needing refills on Losartan, Allopurinol, Metformin, and Hydroxyzine. -Send refills for Losartan, Allopurinol, Metformin, and Hydroxyzine to pharmacy.        Follow Up Instructions: Keep previously scheduled appointment   I discussed the assessment and treatment plan with the patient. The patient was provided an opportunity to ask questions and all were answered. The patient agreed with the plan and demonstrated an understanding of the instructions.   The patient was advised to call back or seek an in-person evaluation if the symptoms worsen or if the condition fails to improve as anticipated.     I provided 14 minutes total of Telehealth time during this encounter including median intraservice time, reviewing previous notes, investigations, ordering medications, medical decision making, coordinating care and patient verbalized understanding at the end of the visit.     Hoy Register, MD, FAAFP. [facility] [place], [place] [phone]   10/08/2022, 2:45 PM

## 2022-10-09 ENCOUNTER — Ambulatory Visit: Payer: Medicaid Other | Attending: Family Medicine

## 2022-10-09 ENCOUNTER — Other Ambulatory Visit: Payer: Self-pay

## 2022-10-09 DIAGNOSIS — I2510 Atherosclerotic heart disease of native coronary artery without angina pectoris: Secondary | ICD-10-CM

## 2022-10-09 DIAGNOSIS — E119 Type 2 diabetes mellitus without complications: Secondary | ICD-10-CM

## 2022-10-09 DIAGNOSIS — I700 Atherosclerosis of aorta: Secondary | ICD-10-CM | POA: Diagnosis not present

## 2022-10-09 DIAGNOSIS — E291 Testicular hypofunction: Secondary | ICD-10-CM | POA: Diagnosis not present

## 2022-10-09 DIAGNOSIS — E1159 Type 2 diabetes mellitus with other circulatory complications: Secondary | ICD-10-CM

## 2022-10-09 DIAGNOSIS — E785 Hyperlipidemia, unspecified: Secondary | ICD-10-CM | POA: Diagnosis not present

## 2022-10-09 DIAGNOSIS — E1169 Type 2 diabetes mellitus with other specified complication: Secondary | ICD-10-CM

## 2022-10-09 DIAGNOSIS — I152 Hypertension secondary to endocrine disorders: Secondary | ICD-10-CM | POA: Diagnosis not present

## 2022-10-09 MED ORDER — TESTOSTERONE CYPIONATE 200 MG/ML IM SOLN
200.0000 mg | INTRAMUSCULAR | 0 refills | Status: DC
Start: 2022-10-09 — End: 2022-10-09

## 2022-10-09 MED ORDER — TESTOSTERONE CYPIONATE 200 MG/ML IM SOLN
200.0000 mg | Freq: Once | INTRAMUSCULAR | Status: AC
Start: 2022-10-09 — End: 2022-10-09
  Administered 2022-10-09: 200 mg via INTRAMUSCULAR

## 2022-10-09 NOTE — Progress Notes (Signed)
Testosterone administered IM Injection per protocols.  Patient denies and pain or discomfort at injection site. Tolerated injection well no reaction.

## 2022-10-10 LAB — LIPID PANEL
Chol/HDL Ratio: 3.4 ratio (ref 0.0–5.0)
Cholesterol, Total: 137 mg/dL (ref 100–199)
HDL: 40 mg/dL (ref >39)
LDL Chol Calc (NIH): 65 mg/dL (ref 0–99)
Triglycerides: 195 mg/dL — ABNORMAL HIGH (ref 0–149)
VLDL Cholesterol Cal: 32 mg/dL (ref 5–40)

## 2022-10-10 LAB — HEPATIC FUNCTION PANEL
ALT: 14 IU/L (ref 0–44)
AST: 10 IU/L (ref 0–40)
Albumin: 4.2 g/dL (ref 3.9–4.9)
Alkaline Phosphatase: 63 IU/L (ref 44–121)
Bilirubin Total: 0.3 mg/dL (ref 0.0–1.2)
Bilirubin, Direct: <0.1 mg/dL (ref 0.00–0.40)
Total Protein: 6.6 g/dL (ref 6.0–8.5)

## 2022-10-10 LAB — PSA, TOTAL AND FREE
PSA, Free Pct: 36.7 %
PSA, Free: 0.11 ng/mL
Prostate Specific Ag, Serum: 0.3 ng/mL (ref 0.0–4.0)

## 2022-10-14 NOTE — Therapy (Unsigned)
OUTPATIENT PHYSICAL THERAPY SHOULDER EVALUATION   Patient Name: Donald Bender MRN: [id number] DOB:1961-01-07, 62 y.o., male Today's Date: 10/15/2022  END OF SESSION:  PT End of Session - 10/15/22 1149     Visit Number 1    Number of Visits 12    Date for PT Re-Evaluation 12/15/22    Authorization Type MCD    PT Start Time 1000    PT Stop Time 1045    PT Time Calculation (min) 45 min    Activity Tolerance Patient tolerated treatment well    Behavior During Therapy [facility] for tasks assessed/performed             Past Medical History:  Diagnosis Date   Acute respiratory failure with hypoxia (HCC) 07/25/2021   Arthritis    osteoarthritis and gout   Diabetes mellitus without complication (HCC)    Diverticulitis    Hypertension    Past Surgical History:  Procedure Laterality Date   COLONOSCOPY     HYDROCELE EXCISION / REPAIR     NO PAST SURGERIES     PROCTOSCOPY N/A 02/22/2022   Procedure: RIGID PROCTOSCOPY;  Surgeon: Karie Soda, MD;  Location: [facility];  Service: General;  Laterality: N/A;   XI ROBOTIC ASSISTED LOWER ANTERIOR RESECTION  02/22/2022   for diverticulitis/takedown of colovesical fistula   XI ROBOTIC LAPAROSCOPIC ASSISTED APPENDECTOMY  02/22/2022   Patient Active Problem List   Diagnosis Date Noted   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   Hydrocele, right 01/01/2022   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   Lipoma of transverse colon 01/01/2022   Diastasis recti 01/01/2022   History of adenomatous polyp of colon 01/01/2022   History of recent steroid use 01/01/2022   Nasal mass 01/01/2022   Non-recurrent bilateral inguinal hernia without obstruction or gangrene 01/01/2022   Umbilical hernia without obstruction and without gangrene 01/01/2022   Hypokalemia 12/04/2021   Hypomagnesemia 12/04/2021   Type 2 diabetes mellitus with hyperglycemia (HCC) 12/01/2021   Hypertension 12/01/2021   Diverticulitis of colon with perforation 11/30/2021   Diverticulitis of large intestine  with perforation 07/25/2021   Hypertensive urgency 07/25/2021   SIRS (systemic inflammatory response syndrome) (HCC) 07/25/2021   Leukocytosis 07/25/2021   Tobacco abuse 07/25/2021   Alcohol abuse 07/25/2021   Does not have health insurance 07/25/2021   Obesity (BMI 30-39.9) 07/25/2021   Acute respiratory failure with hypoxia (HCC) 07/25/2021   Polycythemia    Cellulitis 03/18/2020   Hypertriglyceridemia 07/06/2016   Gout 11/16/2015   Essential hypertension 09/11/2013   Type 2 diabetes mellitus without complication, without long-term current use of insulin (HCC) 09/11/2013    PCP: Hoy Register, MD  REFERRING PROVIDER: Hoy Register, MD  REFERRING DIAG: M75.01,M75.02 (ICD-10-CM) - Adhesive capsulitis of both shoulders  THERAPY DIAG:  Chronic pain of both shoulders  Muscle weakness (generalized)  Abnormal posture  Rationale for Evaluation and Treatment: Rehabilitation  ONSET DATE: chronic  SUBJECTIVE:  SUBJECTIVE STATEMENT: Relates several month history of B shoulder pain, L>R.  No relief OTC meds. Hand dominance: Right  PERTINENT HISTORY: ESPEN Bender is a 62 y.o. year old male with a history of type 2 diabetes mellitus (A1c 5.6), hypertension, polycythemia, aortic atherosclerosis, coronary artery calcifications tobacco abuse (2-3 packs of cigarettes/day for close to 50 years)      Interval History: Discussed the use of AI scribe software for clinical note transcription with the patient, who gave verbal consent to proceed.   He presents with low energy and bilateral shoulder pain. He reports a lack of motivation to complete tasks around the house and has to force himself to mow his small yard, often needing to take breaks. This has been ongoing for approximately two to three years. He  has a history of low testosterone in the past.   In addition to the low energy, he has been experiencing bilateral shoulder pain, which he rates as severe. The pain is particularly noticeable when reaching out or up. He has not previously had physical therapy for this issue.   The patient continues to smoke and has recently started drinking coffee with sugar and creamer. He has an increase in his A1c from 5.6 to 6.8 and suspects that the addition of coffee to his diet may be contributing to this. He expresses a desire to quit smoking but feels that he needs to address his low energy levels first.   PAIN:  Are you having pain? Yes: NPRS scale: 7-8/10 Pain location: B shoulders Pain description: ache Aggravating factors: OH reaching and sleep positions Relieving factors: undetermined  PRECAUTIONS: None  RED FLAGS: None   WEIGHT BEARING RESTRICTIONS: No  FALLS:  Has patient fallen in last 6 months? No  OCCUPATION: Not working  PLOF: Independent  PATIENT GOALS: To reduce and manage my shoulder problem  NEXT MD VISIT:   OBJECTIVE:   DIAGNOSTIC FINDINGS:  none  PATIENT SURVEYS:  FOTO 50(62 predicted)  POSTURE: Forward and rounded shoulder.  UPPER EXTREMITY ROM:   A/PROM Right eval Left eval  Shoulder flexion 120/150d 90/120d  Shoulder extension    Shoulder abduction 70/170d 60/135d  Shoulder adduction    Shoulder internal rotation L5/45 L SI/70  Shoulder external rotation Occiput/50d Occiput/40d  Elbow flexion    Elbow extension    Wrist flexion    Wrist extension    Wrist ulnar deviation    Wrist radial deviation    Wrist pronation    Wrist supination    (Blank rows = not tested)  UPPER EXTREMITY MMT:  MMT Right eval Left eval  Shoulder flexion 4- 4-  Shoulder extension 4- 4-  Shoulder abduction 4- 4-  Shoulder adduction    Shoulder internal rotation 4- 4-  Shoulder external rotation 4- 4-  Middle trapezius    Lower trapezius    Elbow flexion     Elbow extension    Wrist flexion    Wrist extension    Wrist ulnar deviation    Wrist radial deviation    Wrist pronation    Wrist supination    Grip strength (lbs)    (Blank rows = not tested)  SHOULDER SPECIAL TESTS: Impingement tests: Neer impingement test: positive B  Rotator cuff assessment: Empty can test: negative   PALPATION:  deferred   TODAY'S TREATMENT:  DATE: 10/15/22 Eval   PATIENT EDUCATION: Education details: Discussed eval findings, rehab rationale and POC and patient is in agreement  Person educated: Patient Education method: Explanation Education comprehension: verbalized understanding and needs further education  HOME EXERCISE PROGRAM: Access Code: [email or web address] URL: [email or web address] Date: 10/15/2022 Prepared by: Gustavus Bryant  Exercises - Shoulder External Rotation and Scapular Retraction with Resistance  - 2 x daily - 5 x weekly - 2 sets - 15 reps - Standing Shoulder Horizontal Abduction with Resistance  - 2 x daily - 5 x weekly - 2 sets - 15 reps - Scapular Wall Slides  - 2 x daily - 5 x weekly - 2 sets - 15 reps  ASSESSMENT:  CLINICAL IMPRESSION: Patient is a 62 y.o. male who was seen today for physical therapy evaluation and treatment for B shoulder pain and stiffness. He demonstrates decreased A/PROM, a rounded and forward shoulder posturing increased kyphosis. Positive impingement signs noted but RC testing unremarkable for tear/pathology.  Overall RC strength functional but marked posterior shoulder strength deficits present.  Patient is a good candidate for OPPT.  OBJECTIVE IMPAIRMENTS: decreased activity tolerance, decreased knowledge of condition, decreased mobility, decreased ROM, decreased strength, impaired perceived functional ability, impaired UE functional use, postural dysfunction, and  pain.   ACTIVITY LIMITATIONS: carrying, lifting, sleeping, and reach over head  REHAB POTENTIAL: Good  CLINICAL DECISION MAKING: Stable/uncomplicated  EVALUATION COMPLEXITY: Low   GOALS: Goals reviewed with patient? No  SHORT TERM GOALS: Target date: 11/05/2022    Patient to demonstrate independence in HEP  Baseline: [id number] Goal status: INITIAL  2.  120d AROM in B flexion and abduction Baseline:  A/PROM Right eval Left eval  Shoulder flexion 120/150d 90/120d  Shoulder extension    Shoulder abduction 70/170d 60/135d  Shoulder adduction    Shoulder internal rotation L5/45 L SI/70  Shoulder external rotation Occiput/50d Occiput/40d   Goal status: INITIAL  3.  6/10 pain Baseline: 7-8/10  Goal status: INITIAL   LONG TERM GOALS: Target date: 11/26/2022    Patient will score at least 62% on FOTO to signify clinically meaningful improvement in functional abilities.   Baseline: 55% Goal status: INITIAL  2.  Increase B shoulder strength to 4/5 Baseline:  MMT Right eval Left eval  Shoulder flexion 4- 4-  Shoulder extension 4- 4-  Shoulder abduction 4- 4-  Shoulder adduction    Shoulder internal rotation 4- 4-  Shoulder external rotation 4- 4-   Goal status: INITIAL  3.  Decrease pain to 4/10 Baseline: 7-8/10 Goal status: INITIAL  4.  Increase AROM to 160d flexion and abduction, ER to C7 and IR to L5 Baseline:  A/PROM Right eval Left eval  Shoulder flexion 120/150d 90/120d  Shoulder extension    Shoulder abduction 70/170d 60/135d  Shoulder adduction    Shoulder internal rotation L5/45 L SI/70  Shoulder external rotation Occiput/50d Occiput/40d   Goal status: INITIAL    PLAN:  PT FREQUENCY: 1-2x/week  PT DURATION: 6 weeks  PLANNED INTERVENTIONS: Therapeutic exercises, Therapeutic activity, Neuromuscular re-education, Balance training, Gait training, Patient/Family education, Self Care, Joint mobilization, Dry Needling, Electrical stimulation,  Spinal mobilization, Cryotherapy, Moist heat, Manual therapy, and Re-evaluation  PLAN FOR NEXT SESSION: HEP review and update, manual techniques as appropriate, aerobic tasks, ROM and flexibility activities, strengthening and PREs, TPDN, gait and balance training as needed     Hildred Laser, PT 10/15/2022, 11:51 AM   Check all possible CPT codes: 01027 - PT Re-evaluation, 97110- Therapeutic Exercise, [phone]- Neuro Re-education,  16109 - Gait Training, 60454 - Manual Therapy, [id number] - Therapeutic Activities, and [phone] - Self Care    Check all conditions that are expected to impact treatment: {Conditions expected to impact treatment:None of these apply   If treatment provided at initial evaluation, no treatment charged due to lack of authorization.

## 2022-10-14 NOTE — Therapy (Unsigned)
OUTPATIENT PHYSICAL THERAPY SHOULDER EVALUATION   Patient Name: Donald Bender MRN: [id number] DOB:10-Jan-1961, 62 y.o., male Today's Date: 10/14/2022  END OF SESSION:   Past Medical History:  Diagnosis Date   Acute respiratory failure with hypoxia (HCC) 07/25/2021   Arthritis    osteoarthritis and gout   Diabetes mellitus without complication (HCC)    Diverticulitis    Hypertension    Past Surgical History:  Procedure Laterality Date   COLONOSCOPY     HYDROCELE EXCISION / REPAIR     NO PAST SURGERIES     PROCTOSCOPY N/A 02/22/2022   Procedure: RIGID PROCTOSCOPY;  Surgeon: Karie Soda, MD;  Location: [facility];  Service: General;  Laterality: N/A;   XI ROBOTIC ASSISTED LOWER ANTERIOR RESECTION  02/22/2022   for diverticulitis/takedown of colovesical fistula   XI ROBOTIC LAPAROSCOPIC ASSISTED APPENDECTOMY  02/22/2022   Patient Active Problem List   Diagnosis Date Noted   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   Hydrocele, right 01/01/2022   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   Lipoma of transverse colon 01/01/2022   Diastasis recti 01/01/2022   History of adenomatous polyp of colon 01/01/2022   History of recent steroid use 01/01/2022   Nasal mass 01/01/2022   Non-recurrent bilateral inguinal hernia without obstruction or gangrene 01/01/2022   Umbilical hernia without obstruction and without gangrene 01/01/2022   Hypokalemia 12/04/2021   Hypomagnesemia 12/04/2021   Type 2 diabetes mellitus with hyperglycemia (HCC) 12/01/2021   Hypertension 12/01/2021   Diverticulitis of colon with perforation 11/30/2021   Diverticulitis of large intestine with perforation 07/25/2021   Hypertensive urgency 07/25/2021   SIRS (systemic inflammatory response syndrome) (HCC) 07/25/2021   Leukocytosis 07/25/2021   Tobacco abuse 07/25/2021   Alcohol abuse 07/25/2021   Does not have health insurance 07/25/2021   Obesity (BMI 30-39.9) 07/25/2021   Acute respiratory failure with hypoxia (HCC) 07/25/2021    Polycythemia    Cellulitis 03/18/2020   Hypertriglyceridemia 07/06/2016   Gout 11/16/2015   Essential hypertension 09/11/2013   Type 2 diabetes mellitus without complication, without long-term current use of insulin (HCC) 09/11/2013    PCP: Hoy Register, MD   REFERRING PROVIDER: Hoy Register, MD   REFERRING DIAG: M75.01,M75.02 (ICD-10-CM) - Adhesive capsulitis of both shoulders   THERAPY DIAG:  No diagnosis found.  Rationale for Evaluation and Treatment: Rehabilitation  ONSET DATE: ***  SUBJECTIVE:                                                                                                                                                                                      SUBJECTIVE STATEMENT: *** Hand dominance: {MISC; OT HAND [phone]}  PERTINENT HISTORY: ***  PAIN:  Are  you having pain? {OPRCPAIN:27236}  PRECAUTIONS: None  RED FLAGS: None   WEIGHT BEARING RESTRICTIONS: No  FALLS:  Has patient fallen in last 6 months? No  OCCUPATION: ***  PLOF: Independent  PATIENT GOALS:***  NEXT MD VISIT:   OBJECTIVE:   DIAGNOSTIC FINDINGS:  none  PATIENT SURVEYS:  FOTO ***  POSTURE: ***  UPPER EXTREMITY ROM:   {AROM/PROM:27142} ROM Right eval Left eval  Shoulder flexion    Shoulder extension    Shoulder abduction    Shoulder adduction    Shoulder internal rotation    Shoulder external rotation    Elbow flexion    Elbow extension    Wrist flexion    Wrist extension    Wrist ulnar deviation    Wrist radial deviation    Wrist pronation    Wrist supination    (Blank rows = not tested)  UPPER EXTREMITY MMT:  MMT Right eval Left eval  Shoulder flexion    Shoulder extension    Shoulder abduction    Shoulder adduction    Shoulder internal rotation    Shoulder external rotation    Middle trapezius    Lower trapezius    Elbow flexion    Elbow extension    Wrist flexion    Wrist extension    Wrist ulnar deviation     Wrist radial deviation    Wrist pronation    Wrist supination    Grip strength (lbs)    (Blank rows = not tested)  SHOULDER SPECIAL TESTS: Impingement tests: {shoulder impingement test:25231:a} SLAP lesions: {SLAP lesions:25232} Instability tests: {shoulder instability test:25233} Rotator cuff assessment: {rotator cuff assessment:25234} Biceps assessment: {biceps assessment:25235}  JOINT MOBILITY TESTING:  ***  PALPATION:  ***   TODAY'S TREATMENT:                                                                                                                                         DATE: ***   PATIENT EDUCATION: Education details: Discussed eval findings, rehab rationale and POC and patient is in agreement  Person educated: Patient Education method: Explanation Education comprehension: verbalized understanding and needs further education  HOME EXERCISE PROGRAM: ***  ASSESSMENT:  CLINICAL IMPRESSION: Patient is a *** y.o. *** who was seen today for physical therapy evaluation and treatment for ***.   OBJECTIVE IMPAIRMENTS: {opptimpairments:25111}.   ACTIVITY LIMITATIONS: {activitylimitations:27494}  PARTICIPATION LIMITATIONS: {participationrestrictions:25113}  PERSONAL FACTORS: {Personal factors:25162} are also affecting patient's functional outcome.   REHAB POTENTIAL: Good  CLINICAL DECISION MAKING: Stable/uncomplicated  EVALUATION COMPLEXITY: Low   GOALS: Goals reviewed with patient? {yes/no:20286}  SHORT TERM GOALS: Target date: ***  *** Baseline: Goal status: INITIAL  2.  *** Baseline:  Goal status: INITIAL  3.  *** Baseline:  Goal status: INITIAL  4.  *** Baseline:  Goal status: INITIAL  5.  *** Baseline:  Goal status: INITIAL  6.  *** Baseline:  Goal status: INITIAL  LONG TERM GOALS: Target date: ***  *** Baseline:  Goal status: INITIAL  2.  *** Baseline:  Goal status: INITIAL  3.  *** Baseline:  Goal status:  INITIAL  4.  *** Baseline:  Goal status: INITIAL  5.  *** Baseline:  Goal status: INITIAL  6.  *** Baseline:  Goal status: INITIAL  PLAN:  PT FREQUENCY: {rehab frequency:25116}  PT DURATION: {rehab duration:25117}  PLANNED INTERVENTIONS: {rehab planned interventions:25118::"Therapeutic exercises","Therapeutic activity","Neuromuscular re-education","Balance training","Gait training","Patient/Family education","Self Care","Joint mobilization"}  PLAN FOR NEXT SESSION: ***   Hildred Laser, PT 10/14/2022, 8:01 PM

## 2022-10-15 ENCOUNTER — Telehealth: Payer: Self-pay | Admitting: Interventional Cardiology

## 2022-10-15 ENCOUNTER — Other Ambulatory Visit: Payer: Self-pay

## 2022-10-15 ENCOUNTER — Ambulatory Visit: Payer: Medicaid Other | Attending: Family Medicine

## 2022-10-15 DIAGNOSIS — R293 Abnormal posture: Secondary | ICD-10-CM | POA: Insufficient documentation

## 2022-10-15 DIAGNOSIS — M6281 Muscle weakness (generalized): Secondary | ICD-10-CM | POA: Diagnosis not present

## 2022-10-15 DIAGNOSIS — M25512 Pain in left shoulder: Secondary | ICD-10-CM | POA: Insufficient documentation

## 2022-10-15 DIAGNOSIS — M25511 Pain in right shoulder: Secondary | ICD-10-CM | POA: Diagnosis not present

## 2022-10-15 DIAGNOSIS — M7501 Adhesive capsulitis of right shoulder: Secondary | ICD-10-CM | POA: Insufficient documentation

## 2022-10-15 DIAGNOSIS — G8929 Other chronic pain: Secondary | ICD-10-CM | POA: Diagnosis not present

## 2022-10-15 DIAGNOSIS — M7502 Adhesive capsulitis of left shoulder: Secondary | ICD-10-CM | POA: Diagnosis not present

## 2022-10-15 NOTE — Telephone Encounter (Signed)
Patient notified

## 2022-10-15 NOTE — Telephone Encounter (Signed)
Patient is returning call in regards to labs. Requesting return call.

## 2022-10-15 NOTE — Telephone Encounter (Signed)
-----   Message from [place] sent at 10/12/2022 11:40 AM EDT ----- LDL improved.  LFTs stable.

## 2022-10-23 NOTE — Therapy (Unsigned)
OUTPATIENT PHYSICAL THERAPY SHOULDER EVALUATION   Patient Name: Donald Bender MRN: [id number] DOB:1960/08/24, 62 y.o., male Today's Date: 10/25/2022  END OF SESSION:  PT End of Session - 10/25/22 1357     Visit Number 2    Number of Visits 12    Date for PT Re-Evaluation 12/15/22    Authorization Type MCD    PT Start Time 1400    PT Stop Time 1445    PT Time Calculation (min) 45 min    Activity Tolerance Patient tolerated treatment well    Behavior During Therapy [facility] for tasks assessed/performed              Past Medical History:  Diagnosis Date   Acute respiratory failure with hypoxia (HCC) 07/25/2021   Arthritis    osteoarthritis and gout   Diabetes mellitus without complication (HCC)    Diverticulitis    Hypertension    Past Surgical History:  Procedure Laterality Date   COLONOSCOPY     HYDROCELE EXCISION / REPAIR     NO PAST SURGERIES     PROCTOSCOPY N/A 02/22/2022   Procedure: RIGID PROCTOSCOPY;  Surgeon: Karie Soda, MD;  Location: [facility];  Service: General;  Laterality: N/A;   XI ROBOTIC ASSISTED LOWER ANTERIOR RESECTION  02/22/2022   for diverticulitis/takedown of colovesical fistula   XI ROBOTIC LAPAROSCOPIC ASSISTED APPENDECTOMY  02/22/2022   Patient Active Problem List   Diagnosis Date Noted   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   Hydrocele, right 01/01/2022   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   Lipoma of transverse colon 01/01/2022   Diastasis recti 01/01/2022   History of adenomatous polyp of colon 01/01/2022   History of recent steroid use 01/01/2022   Nasal mass 01/01/2022   Non-recurrent bilateral inguinal hernia without obstruction or gangrene 01/01/2022   Umbilical hernia without obstruction and without gangrene 01/01/2022   Hypokalemia 12/04/2021   Hypomagnesemia 12/04/2021   Type 2 diabetes mellitus with hyperglycemia (HCC) 12/01/2021   Hypertension 12/01/2021   Diverticulitis of colon with perforation 11/30/2021   Diverticulitis of large  intestine with perforation 07/25/2021   Hypertensive urgency 07/25/2021   SIRS (systemic inflammatory response syndrome) (HCC) 07/25/2021   Leukocytosis 07/25/2021   Tobacco abuse 07/25/2021   Alcohol abuse 07/25/2021   Does not have health insurance 07/25/2021   Obesity (BMI 30-39.9) 07/25/2021   Acute respiratory failure with hypoxia (HCC) 07/25/2021   Polycythemia    Cellulitis 03/18/2020   Hypertriglyceridemia 07/06/2016   Gout 11/16/2015   Essential hypertension 09/11/2013   Type 2 diabetes mellitus without complication, without long-term current use of insulin (HCC) 09/11/2013    PCP: Hoy Register, MD  REFERRING PROVIDER: Hoy Register, MD  REFERRING DIAG: M75.01,M75.02 (ICD-10-CM) - Adhesive capsulitis of both shoulders  THERAPY DIAG:  Chronic pain of both shoulders  Muscle weakness (generalized)  Abnormal posture  Rationale for Evaluation and Treatment: Rehabilitation  ONSET DATE: chronic  SUBJECTIVE:  SUBJECTIVE STATEMENT: No increased discomfort following first session.  Feels he may have more mobility.  No questions regarding initial visit findings Hand dominance: Right  PERTINENT HISTORY: Donald Bender is a 62 y.o. year old male with a history of type 2 diabetes mellitus (A1c 5.6), hypertension, polycythemia, aortic atherosclerosis, coronary artery calcifications tobacco abuse (2-3 packs of cigarettes/day for close to 50 years)      Interval History: Discussed the use of AI scribe software for clinical note transcription with the patient, who gave verbal consent to proceed.   He presents with low energy and bilateral shoulder pain. He reports a lack of motivation to complete tasks around the house and has to force himself to mow his small yard, often needing to take breaks.  This has been ongoing for approximately two to three years. He has a history of low testosterone in the past.   In addition to the low energy, he has been experiencing bilateral shoulder pain, which he rates as severe. The pain is particularly noticeable when reaching out or up. He has not previously had physical therapy for this issue.   The patient continues to smoke and has recently started drinking coffee with sugar and creamer. He has an increase in his A1c from 5.6 to 6.8 and suspects that the addition of coffee to his diet may be contributing to this. He expresses a desire to quit smoking but feels that he needs to address his low energy levels first.   PAIN:  Are you having pain? Yes: NPRS scale: 7-8/10 Pain location: B shoulders Pain description: ache Aggravating factors: OH reaching and sleep positions Relieving factors: undetermined  PRECAUTIONS: None  RED FLAGS: None   WEIGHT BEARING RESTRICTIONS: No  FALLS:  Has patient fallen in last 6 months? No  OCCUPATION: Not working  PLOF: Independent  PATIENT GOALS: To reduce and manage my shoulder problem  NEXT MD VISIT:   OBJECTIVE:   DIAGNOSTIC FINDINGS:  none  PATIENT SURVEYS:  FOTO 50(62 predicted)  POSTURE: Forward and rounded shoulder.  UPPER EXTREMITY ROM:   A/PROM Right eval Left eval  Shoulder flexion 120/150d 90/120d  Shoulder extension    Shoulder abduction 70/170d 60/135d  Shoulder adduction    Shoulder internal rotation L5/45 L SI/70  Shoulder external rotation Occiput/50d Occiput/40d  Elbow flexion    Elbow extension    Wrist flexion    Wrist extension    Wrist ulnar deviation    Wrist radial deviation    Wrist pronation    Wrist supination    (Blank rows = not tested)  UPPER EXTREMITY MMT:  MMT Right eval Left eval  Shoulder flexion 4- 4-  Shoulder extension 4- 4-  Shoulder abduction 4- 4-  Shoulder adduction    Shoulder internal rotation 4- 4-  Shoulder external rotation 4-  4-  Middle trapezius    Lower trapezius    Elbow flexion    Elbow extension    Wrist flexion    Wrist extension    Wrist ulnar deviation    Wrist radial deviation    Wrist pronation    Wrist supination    Grip strength (lbs)    (Blank rows = not tested)  SHOULDER SPECIAL TESTS: Impingement tests: Neer impingement test: positive B  Rotator cuff assessment: Empty can test: negative   PALPATION:  deferred   TODAY'S TREATMENT:         [facility] Adult PT Treatment:  DATE: 10/25/22 Therapeutic Exercise: Nustep L2 6 min OH flexion with stick focus on breathing pattern 10x Chest press with stick and protraction 10x B ER with stick 10x Supine hor abd YTB 15x Seated hor abd YTB 15x Seated ER with retraction YTB 15x Manual Therapy: STM B pec minor 2 min each side B shoulder mobs inf/post/distraction 5x10 each                                                                                                                                DATE: 10/15/22 Eval   PATIENT EDUCATION: Education details: Discussed eval findings, rehab rationale and POC and patient is in agreement  Person educated: Patient Education method: Explanation Education comprehension: verbalized understanding and needs further education  HOME EXERCISE PROGRAM: Access Code: [email or web address] URL: [email or web address] Date: 10/15/2022 Prepared by: Gustavus Bryant  Exercises - Shoulder External Rotation and Scapular Retraction with Resistance  - 2 x daily - 5 x weekly - 2 sets - 15 reps - Standing Shoulder Horizontal Abduction with Resistance  - 2 x daily - 5 x weekly - 2 sets - 15 reps - Scapular Wall Slides  - 2 x daily - 5 x weekly - 2 sets - 15 reps  ASSESSMENT:  CLINICAL IMPRESSION: Initiated AROM to improve B shoulder mobility and promote postural correction.  Performed joint mobs in conjunction with STM to B pec minor following identification of taught bands.   Less discomfort and improved AROM demonstrated at end of session.    Patient is a 62 y.o. male who was seen today for physical therapy evaluation and treatment for B shoulder pain and stiffness. He demonstrates decreased A/PROM, a rounded and forward shoulder posturing increased kyphosis. Positive impingement signs noted but RC testing unremarkable for tear/pathology.  Overall RC strength functional but marked posterior shoulder strength deficits present.  Patient is a good candidate for OPPT.  OBJECTIVE IMPAIRMENTS: decreased activity tolerance, decreased knowledge of condition, decreased mobility, decreased ROM, decreased strength, impaired perceived functional ability, impaired UE functional use, postural dysfunction, and pain.   ACTIVITY LIMITATIONS: carrying, lifting, sleeping, and reach over head  REHAB POTENTIAL: Good  CLINICAL DECISION MAKING: Stable/uncomplicated  EVALUATION COMPLEXITY: Low   GOALS: Goals reviewed with patient? No  SHORT TERM GOALS: Target date: 11/05/2022    Patient to demonstrate independence in HEP  Baseline: [id number] Goal status: INITIAL  2.  120d AROM in B flexion and abduction Baseline:  A/PROM Right eval Left eval  Shoulder flexion 120/150d 90/120d  Shoulder extension    Shoulder abduction 70/170d 60/135d  Shoulder adduction    Shoulder internal rotation L5/45 L SI/70  Shoulder external rotation Occiput/50d Occiput/40d   Goal status: INITIAL  3.  6/10 pain Baseline: 7-8/10  Goal status: INITIAL   LONG TERM GOALS: Target date: 11/26/2022    Patient will score at least 62% on FOTO to signify clinically meaningful improvement in functional abilities.   Baseline: 55% Goal status: INITIAL  2.  Increase B shoulder strength to 4/5 Baseline:  MMT Right eval Left eval  Shoulder flexion 4- 4-  Shoulder extension 4- 4-  Shoulder abduction 4- 4-  Shoulder adduction    Shoulder internal rotation 4- 4-  Shoulder external rotation 4- 4-    Goal status: INITIAL  3.  Decrease pain to 4/10 Baseline: 7-8/10 Goal status: INITIAL  4.  Increase AROM to 160d flexion and abduction, ER to C7 and IR to L5 Baseline:  A/PROM Right eval Left eval  Shoulder flexion 120/150d 90/120d  Shoulder extension    Shoulder abduction 70/170d 60/135d  Shoulder adduction    Shoulder internal rotation L5/45 L SI/70  Shoulder external rotation Occiput/50d Occiput/40d   Goal status: INITIAL    PLAN:  PT FREQUENCY: 1-2x/week  PT DURATION: 6 weeks  PLANNED INTERVENTIONS: Therapeutic exercises, Therapeutic activity, Neuromuscular re-education, Balance training, Gait training, Patient/Family education, Self Care, Joint mobilization, Dry Needling, Electrical stimulation, Spinal mobilization, Cryotherapy, Moist heat, Manual therapy, and Re-evaluation  PLAN FOR NEXT SESSION: HEP review and update, manual techniques as appropriate, aerobic tasks, ROM and flexibility activities, strengthening and PREs, TPDN, gait and balance training as needed     Hildred Laser, PT 10/25/2022, 2:48 PM   Check all possible CPT codes: 76283 - PT Re-evaluation, 97110- Therapeutic Exercise, [phone]- Neuro Re-education, [phone] - Gait Training, [phone] - Manual Therapy, 97530 - Therapeutic Activities, and [phone] - Self Care    Check all conditions that are expected to impact treatment: {Conditions expected to impact treatment:None of these apply   If treatment provided at initial evaluation, no treatment charged due to lack of authorization.

## 2022-10-25 ENCOUNTER — Ambulatory Visit: Payer: Medicaid Other

## 2022-10-25 DIAGNOSIS — R293 Abnormal posture: Secondary | ICD-10-CM

## 2022-10-25 DIAGNOSIS — M6281 Muscle weakness (generalized): Secondary | ICD-10-CM | POA: Diagnosis not present

## 2022-10-25 DIAGNOSIS — M25511 Pain in right shoulder: Secondary | ICD-10-CM | POA: Diagnosis not present

## 2022-10-25 DIAGNOSIS — M7502 Adhesive capsulitis of left shoulder: Secondary | ICD-10-CM | POA: Diagnosis not present

## 2022-10-25 DIAGNOSIS — G8929 Other chronic pain: Secondary | ICD-10-CM | POA: Diagnosis not present

## 2022-10-25 DIAGNOSIS — M25512 Pain in left shoulder: Secondary | ICD-10-CM | POA: Diagnosis not present

## 2022-10-25 DIAGNOSIS — M7501 Adhesive capsulitis of right shoulder: Secondary | ICD-10-CM | POA: Diagnosis not present

## 2022-10-29 ENCOUNTER — Ambulatory Visit: Payer: Medicaid Other

## 2022-10-29 ENCOUNTER — Telehealth: Payer: Self-pay

## 2022-10-29 NOTE — Telephone Encounter (Signed)
TC due to missed visit.  Spoke directly to patient who reported he forgot appointment.  Reminded of next appointment day and time

## 2022-10-29 NOTE — Progress Notes (Signed)
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  The conversation was held through videoconferencing.  I confirmed the correct patient via name and birthdate.  He confirmed that he was in his home at the time of the consultation and I was in my office.  We discussed the use of the TRL laser for trimming the rhinophyma as well as the Moxi for treating his rosacea.  Will get a quote for the laser therapy.  Will also submit to see if this can be covered by insurance.  Will schedule once these have been determined.

## 2022-10-31 ENCOUNTER — Ambulatory Visit: Payer: Medicaid Other | Attending: Family Medicine

## 2022-10-31 DIAGNOSIS — M25511 Pain in right shoulder: Secondary | ICD-10-CM | POA: Diagnosis not present

## 2022-10-31 DIAGNOSIS — G8929 Other chronic pain: Secondary | ICD-10-CM | POA: Insufficient documentation

## 2022-10-31 DIAGNOSIS — M25512 Pain in left shoulder: Secondary | ICD-10-CM | POA: Diagnosis not present

## 2022-10-31 DIAGNOSIS — M6281 Muscle weakness (generalized): Secondary | ICD-10-CM | POA: Diagnosis not present

## 2022-10-31 DIAGNOSIS — R293 Abnormal posture: Secondary | ICD-10-CM | POA: Diagnosis not present

## 2022-10-31 NOTE — Therapy (Signed)
OUTPATIENT PHYSICAL THERAPY TREATMENT NOTE   Patient Name: Donald Bender MRN: [id number] DOB:June 29, 1960, 62 y.o., male Today's Date: 10/31/2022  END OF SESSION:  PT End of Session - 10/31/22 1441     Visit Number 3    Number of Visits 12    Date for PT Re-Evaluation 12/15/22    Authorization Type MCD    PT Start Time 1445    PT Stop Time 1523    PT Time Calculation (min) 38 min    Activity Tolerance Patient tolerated treatment well    Behavior During Therapy Donald Bender for tasks assessed/performed              Past Medical History:  Diagnosis Date   Acute respiratory failure with hypoxia (HCC) 07/25/2021   Arthritis    osteoarthritis and gout   Diabetes mellitus without complication (HCC)    Diverticulitis    Hypertension    Past Surgical History:  Procedure Laterality Date   COLONOSCOPY     HYDROCELE EXCISION / REPAIR     NO PAST SURGERIES     PROCTOSCOPY N/A 02/22/2022   Procedure: RIGID PROCTOSCOPY;  Surgeon: Karie Soda, MD;  Location: [facility];  Service: General;  Laterality: N/A;   XI ROBOTIC ASSISTED LOWER ANTERIOR RESECTION  02/22/2022   for diverticulitis/takedown of colovesical fistula   XI ROBOTIC LAPAROSCOPIC ASSISTED APPENDECTOMY  02/22/2022   Patient Active Problem List   Diagnosis Date Noted   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   Hydrocele, right 01/01/2022   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   Lipoma of transverse colon 01/01/2022   Diastasis recti 01/01/2022   History of adenomatous polyp of colon 01/01/2022   History of recent steroid use 01/01/2022   Nasal mass 01/01/2022   Non-recurrent bilateral inguinal hernia without obstruction or gangrene 01/01/2022   Umbilical hernia without obstruction and without gangrene 01/01/2022   Hypokalemia 12/04/2021   Hypomagnesemia 12/04/2021   Type 2 diabetes mellitus with hyperglycemia (HCC) 12/01/2021   Hypertension 12/01/2021   Diverticulitis of colon with perforation 11/30/2021   Diverticulitis of large intestine  with perforation 07/25/2021   Hypertensive urgency 07/25/2021   SIRS (systemic inflammatory response syndrome) (HCC) 07/25/2021   Leukocytosis 07/25/2021   Tobacco abuse 07/25/2021   Alcohol abuse 07/25/2021   Does not have health insurance 07/25/2021   Obesity (BMI 30-39.9) 07/25/2021   Acute respiratory failure with hypoxia (HCC) 07/25/2021   Polycythemia    Cellulitis 03/18/2020   Hypertriglyceridemia 07/06/2016   Gout 11/16/2015   Essential hypertension 09/11/2013   Type 2 diabetes mellitus without complication, without long-term current use of insulin (HCC) 09/11/2013    PCP: Hoy Register, MD  REFERRING PROVIDER: Hoy Register, MD  REFERRING DIAG: M75.01,M75.02 (ICD-10-CM) - Adhesive capsulitis of both shoulders  THERAPY DIAG:  Chronic pain of both shoulders  Muscle weakness (generalized)  Abnormal posture  Rationale for Evaluation and Treatment: Rehabilitation  ONSET DATE: chronic  SUBJECTIVE:  SUBJECTIVE STATEMENT:  Patient reports that his Lt shoulder is hurting more than his Rt today, has been HEP compliant.   Hand dominance: Right  PERTINENT HISTORY: Donald Bender is a 61 y.o. year old male with a history of type 2 diabetes mellitus (A1c 5.6), hypertension, polycythemia, aortic atherosclerosis, coronary artery calcifications tobacco abuse (2-3 packs of cigarettes/day for close to 50 years)      Interval History: Discussed the use of AI scribe software for clinical note transcription with the patient, who gave verbal consent to proceed.   He presents with low energy and bilateral shoulder pain. He reports a lack of motivation to complete tasks around the house and has to force himself to mow his small yard, often needing to take breaks. This has been ongoing for approximately  two to three years. He has a history of low testosterone in the past.   In addition to the low energy, he has been experiencing bilateral shoulder pain, which he rates as severe. The pain is particularly noticeable when reaching out or up. He has not previously had physical therapy for this issue.   The patient continues to smoke and has recently started drinking coffee with sugar and creamer. He has an increase in his A1c from 5.6 to 6.8 and suspects that the addition of coffee to his diet may be contributing to this. He expresses a desire to quit smoking but feels that he needs to address his low energy levels first.   PAIN:  Are you having pain? Yes: NPRS scale: 7-8/10 Pain location: B shoulders Pain description: ache Aggravating factors: OH reaching and sleep positions Relieving factors: undetermined  PRECAUTIONS: None  RED FLAGS: None   WEIGHT BEARING RESTRICTIONS: No  FALLS:  Has patient fallen in last 6 months? No  OCCUPATION: Not working  PLOF: Independent  PATIENT GOALS: To reduce and manage my shoulder problem  NEXT MD VISIT:   OBJECTIVE:   DIAGNOSTIC FINDINGS:  none  PATIENT SURVEYS:  FOTO 50(62 predicted)  POSTURE: Forward and rounded shoulder.  UPPER EXTREMITY ROM:   A/PROM Right eval Left eval  Shoulder flexion 120/150d 90/120d  Shoulder extension    Shoulder abduction 70/170d 60/135d  Shoulder adduction    Shoulder internal rotation L5/45 L SI/70  Shoulder external rotation Occiput/50d Occiput/40d  Elbow flexion    Elbow extension    Wrist flexion    Wrist extension    Wrist ulnar deviation    Wrist radial deviation    Wrist pronation    Wrist supination    (Blank rows = not tested)  UPPER EXTREMITY MMT:  MMT Right eval Left eval  Shoulder flexion 4- 4-  Shoulder extension 4- 4-  Shoulder abduction 4- 4-  Shoulder adduction    Shoulder internal rotation 4- 4-  Shoulder external rotation 4- 4-  Middle trapezius    Lower  trapezius    Elbow flexion    Elbow extension    Wrist flexion    Wrist extension    Wrist ulnar deviation    Wrist radial deviation    Wrist pronation    Wrist supination    Grip strength (lbs)    (Blank rows = not tested)  SHOULDER SPECIAL TESTS: Impingement tests: Neer impingement test: positive B  Rotator cuff assessment: Empty can test: negative   PALPATION:  deferred   TODAY'S TREATMENT:         [facility] Adult PT Treatment:  DATE: 10/31/22 Therapeutic Exercise: Nustep L3 6 min Rows GTB 2x10 Shoulder extension GTB 2x10 OH flexion with stick focus on breathing pattern 10x2 Chest press with stick and protraction 10x2 B ER with stick 10x Supine hor abd YTB 2x10 Seated hor abd YTB 2x10 Seated ER with retraction YTB 2x10 Supine shoulder circles at 90 flexion x30" CW/CCW BIL   [facility] Adult PT Treatment:                                                DATE: 10/25/22 Therapeutic Exercise: Nustep L2 6 min OH flexion with stick focus on breathing pattern 10x Chest press with stick and protraction 10x B ER with stick 10x Supine hor abd YTB 15x Seated hor abd YTB 15x Seated ER with retraction YTB 15x Manual Therapy: STM B pec minor 2 min each side B shoulder mobs inf/post/distraction 5x10 each                                                                                                                                DATE: 10/15/22 Eval   PATIENT EDUCATION: Education details: Discussed eval findings, rehab rationale and POC and patient is in agreement  Person educated: Patient Education method: Explanation Education comprehension: verbalized understanding and needs further education  HOME EXERCISE PROGRAM: Access Code: [email or web address] URL: [email or web address] Date: 10/15/2022 Prepared by: Gustavus Bryant  Exercises - Shoulder External Rotation and Scapular Retraction with Resistance  - 2 x daily - 5 x weekly - 2  sets - 15 reps - Standing Shoulder Horizontal Abduction with Resistance  - 2 x daily - 5 x weekly - 2 sets - 15 reps - Scapular Wall Slides  - 2 x daily - 5 x weekly - 2 sets - 15 reps  ASSESSMENT:  CLINICAL IMPRESSION: Patient presents to PT reporting continued BIL shoulder pain, L>R and that he has been compliant with his HEP. Session today continued to focus on periscapular and RTC strengthening. Patient was able to tolerate all prescribed exercises with no adverse effects. Patient continues to benefit from skilled PT services and should be progressed as able to improve functional independence.      EVAL - Patient is a 62 y.o. male who was seen today for physical therapy evaluation and treatment for B shoulder pain and stiffness. He demonstrates decreased A/PROM, a rounded and forward shoulder posturing increased kyphosis. Positive impingement signs noted but RC testing unremarkable for tear/pathology.  Overall RC strength functional but marked posterior shoulder strength deficits present.  Patient is a good candidate for OPPT.  OBJECTIVE IMPAIRMENTS: decreased activity tolerance, decreased knowledge of condition, decreased mobility, decreased ROM, decreased strength, impaired perceived functional ability, impaired UE functional use, postural dysfunction, and pain.   ACTIVITY LIMITATIONS: carrying, lifting, sleeping, and reach over head  REHAB POTENTIAL: Good  CLINICAL DECISION MAKING: Stable/uncomplicated  EVALUATION COMPLEXITY: Low   GOALS: Goals reviewed with patient? No  SHORT TERM GOALS: Target date: 11/05/2022    Patient to demonstrate independence in HEP  Baseline: [id number] Goal status: MET Pt reports adherence 10/31/22  2.  120d AROM in B flexion and abduction Baseline:  A/PROM Right eval Left eval  Shoulder flexion 120/150d 90/120d  Shoulder extension    Shoulder abduction 70/170d 60/135d  Shoulder adduction    Shoulder internal rotation L5/45 L SI/70  Shoulder  external rotation Occiput/50d Occiput/40d   Goal status: INITIAL  3.  6/10 pain Baseline: 7-8/10  Goal status: INITIAL   LONG TERM GOALS: Target date: 11/26/2022    Patient will score at least 62% on FOTO to signify clinically meaningful improvement in functional abilities.   Baseline: 55% Goal status: INITIAL  2.  Increase B shoulder strength to 4/5 Baseline:  MMT Right eval Left eval  Shoulder flexion 4- 4-  Shoulder extension 4- 4-  Shoulder abduction 4- 4-  Shoulder adduction    Shoulder internal rotation 4- 4-  Shoulder external rotation 4- 4-   Goal status: INITIAL  3.  Decrease pain to 4/10 Baseline: 7-8/10 Goal status: INITIAL  4.  Increase AROM to 160d flexion and abduction, ER to C7 and IR to L5 Baseline:  A/PROM Right eval Left eval  Shoulder flexion 120/150d 90/120d  Shoulder extension    Shoulder abduction 70/170d 60/135d  Shoulder adduction    Shoulder internal rotation L5/45 L SI/70  Shoulder external rotation Occiput/50d Occiput/40d   Goal status: INITIAL    PLAN:  PT FREQUENCY: 1-2x/week  PT DURATION: 6 weeks  PLANNED INTERVENTIONS: Therapeutic exercises, Therapeutic activity, Neuromuscular re-education, Balance training, Gait training, Patient/Family education, Self Care, Joint mobilization, Dry Needling, Electrical stimulation, Spinal mobilization, Cryotherapy, Moist heat, Manual therapy, and Re-evaluation  PLAN FOR NEXT SESSION: HEP review and update, manual techniques as appropriate, aerobic tasks, ROM and flexibility activities, strengthening and PREs, TPDN, gait and balance training as needed     Berta Minor, PTA 10/31/2022, 3:22 PM   Check all possible CPT codes: 84696 - PT Re-evaluation, 97110- Therapeutic Exercise, [phone]- Neuro Re-education, [phone] - Gait Training, [phone] - Manual Therapy, 97530 - Therapeutic Activities, and 97535 - Self Care    Check all conditions that are expected to impact treatment: {Conditions  expected to impact treatment:None of these apply   If treatment provided at initial evaluation, no treatment charged due to lack of authorization.

## 2022-11-05 NOTE — Therapy (Unsigned)
OUTPATIENT PHYSICAL THERAPY TREATMENT NOTE   Patient Name: Donald Bender MRN: [id number] DOB:10/05/1960, 62 y.o., male Today's Date: 11/06/2022  END OF SESSION:  PT End of Session - 11/06/22 1405     Visit Number 4    Number of Visits 12    Date for PT Re-Evaluation 12/15/22    Authorization Type MCD    PT Start Time 1400    PT Stop Time 1440    PT Time Calculation (min) 40 min    Activity Tolerance Patient tolerated treatment well    Behavior During Therapy [facility] for tasks assessed/performed               Past Medical History:  Diagnosis Date   Acute respiratory failure with hypoxia (HCC) 07/25/2021   Arthritis    osteoarthritis and gout   Diabetes mellitus without complication (HCC)    Diverticulitis    Hypertension    Past Surgical History:  Procedure Laterality Date   COLONOSCOPY     HYDROCELE EXCISION / REPAIR     NO PAST SURGERIES     PROCTOSCOPY N/A 02/22/2022   Procedure: RIGID PROCTOSCOPY;  Surgeon: Karie Soda, MD;  Location: [facility];  Service: General;  Laterality: N/A;   XI ROBOTIC ASSISTED LOWER ANTERIOR RESECTION  02/22/2022   for diverticulitis/takedown of colovesical fistula   XI ROBOTIC LAPAROSCOPIC ASSISTED APPENDECTOMY  02/22/2022   Patient Active Problem List   Diagnosis Date Noted   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   Hydrocele, right 01/01/2022   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   Lipoma of transverse colon 01/01/2022   Diastasis recti 01/01/2022   History of adenomatous polyp of colon 01/01/2022   History of recent steroid use 01/01/2022   Nasal mass 01/01/2022   Non-recurrent bilateral inguinal hernia without obstruction or gangrene 01/01/2022   Umbilical hernia without obstruction and without gangrene 01/01/2022   Hypokalemia 12/04/2021   Hypomagnesemia 12/04/2021   Type 2 diabetes mellitus with hyperglycemia (HCC) 12/01/2021   Hypertension 12/01/2021   Diverticulitis of colon with perforation 11/30/2021   Diverticulitis of large intestine  with perforation 07/25/2021   Hypertensive urgency 07/25/2021   SIRS (systemic inflammatory response syndrome) (HCC) 07/25/2021   Leukocytosis 07/25/2021   Tobacco abuse 07/25/2021   Alcohol abuse 07/25/2021   Does not have health insurance 07/25/2021   Obesity (BMI 30-39.9) 07/25/2021   Acute respiratory failure with hypoxia (HCC) 07/25/2021   Polycythemia    Cellulitis 03/18/2020   Hypertriglyceridemia 07/06/2016   Gout 11/16/2015   Essential hypertension 09/11/2013   Type 2 diabetes mellitus without complication, without long-term current use of insulin (HCC) 09/11/2013    PCP: Hoy Register, MD  REFERRING PROVIDER: Hoy Register, MD  REFERRING DIAG: M75.01,M75.02 (ICD-10-CM) - Adhesive capsulitis of both shoulders  THERAPY DIAG:  Chronic pain of both shoulders  Muscle weakness (generalized)  Abnormal posture  Rationale for Evaluation and Treatment: Rehabilitation  ONSET DATE: chronic  SUBJECTIVE:  SUBJECTIVE STATEMENT: Continues to report improving R shoulder pain and mobility, L shoulder not progressing as well.  Hand dominance: Right  PERTINENT HISTORY: Donald Bender is a 62 y.o. year old male with a history of type 2 diabetes mellitus (A1c 5.6), hypertension, polycythemia, aortic atherosclerosis, coronary artery calcifications tobacco abuse (2-3 packs of cigarettes/day for close to 50 years)      Interval History: Discussed the use of AI scribe software for clinical note transcription with the patient, who gave verbal consent to proceed.   He presents with low energy and bilateral shoulder pain. He reports a lack of motivation to complete tasks around the house and has to force himself to mow his small yard, often needing to take breaks. This has been ongoing for approximately two  to three years. He has a history of low testosterone in the past.   In addition to the low energy, he has been experiencing bilateral shoulder pain, which he rates as severe. The pain is particularly noticeable when reaching out or up. He has not previously had physical therapy for this issue.   The patient continues to smoke and has recently started drinking coffee with sugar and creamer. He has an increase in his A1c from 5.6 to 6.8 and suspects that the addition of coffee to his diet may be contributing to this. He expresses a desire to quit smoking but feels that he needs to address his low energy levels first.   PAIN:  Are you having pain? Yes: NPRS scale: 7-8/10 Pain location: B shoulders Pain description: ache Aggravating factors: OH reaching and sleep positions Relieving factors: undetermined  PRECAUTIONS: None  RED FLAGS: None   WEIGHT BEARING RESTRICTIONS: No  FALLS:  Has patient fallen in last 6 months? No  OCCUPATION: Not working  PLOF: Independent  PATIENT GOALS: To reduce and manage my shoulder problem  NEXT MD VISIT:   OBJECTIVE:   DIAGNOSTIC FINDINGS:  none  PATIENT SURVEYS:  FOTO 50(62 predicted)  POSTURE: Forward and rounded shoulder.  UPPER EXTREMITY ROM:   A/PROM Right eval Left eval  Shoulder flexion 120/150d 90/120d  Shoulder extension    Shoulder abduction 70/170d 60/135d  Shoulder adduction    Shoulder internal rotation L5/45 L SI/70  Shoulder external rotation Occiput/50d Occiput/40d  Elbow flexion    Elbow extension    Wrist flexion    Wrist extension    Wrist ulnar deviation    Wrist radial deviation    Wrist pronation    Wrist supination    (Blank rows = not tested)  UPPER EXTREMITY MMT:  MMT Right eval Left eval  Shoulder flexion 4- 4-  Shoulder extension 4- 4-  Shoulder abduction 4- 4-  Shoulder adduction    Shoulder internal rotation 4- 4-  Shoulder external rotation 4- 4-  Middle trapezius    Lower trapezius     Elbow flexion    Elbow extension    Wrist flexion    Wrist extension    Wrist ulnar deviation    Wrist radial deviation    Wrist pronation    Wrist supination    Grip strength (lbs)    (Blank rows = not tested)  SHOULDER SPECIAL TESTS: Impingement tests: Neer impingement test: positive B  Rotator cuff assessment: Empty can test: negative   PALPATION:  deferred   TODAY'S TREATMENT:     [facility] Adult PT Treatment:  DATE: 11/06/22 Therapeutic Exercise: Nustep L4 6 min 50 SPM target OH flexion with stick focus on breathing pattern 10x 4# Chest press with stick and protraction 10x 4# Supine hor abd RTB 15x Seated hor abd RTB 15x Seated ER with retraction YTB 15x Manual Therapy: STM B pec minor 2 min each side B shoulder mobs inf/post/distraction 5x10 each  PNF D1 F/E 15/15    [facility] Adult PT Treatment:                                                DATE: 10/31/22 Therapeutic Exercise: Nustep L3 6 min Rows GTB 2x10 Shoulder extension GTB 2x10 OH flexion with stick focus on breathing pattern 10x2 Chest press with stick and protraction 10x2 B ER with stick 10x Supine hor abd YTB 2x10 Seated hor abd YTB 2x10 Seated ER with retraction YTB 2x10 Supine shoulder circles at 90 flexion x30" CW/CCW BIL   [facility] Adult PT Treatment:                                                DATE: 10/25/22 Therapeutic Exercise: Nustep L2 6 min OH flexion with stick focus on breathing pattern 10x Chest press with stick and protraction 10x B ER with stick 10x Supine hor abd YTB 15x Seated hor abd YTB 15x Seated ER with retraction YTB 15x Manual Therapy: STM B pec minor 2 min each side B shoulder mobs inf/post/distraction 5x10 each                                                                                                                                DATE: 10/15/22 Eval   PATIENT EDUCATION: Education details: Discussed eval findings, rehab  rationale and POC and patient is in agreement  Person educated: Patient Education method: Explanation Education comprehension: verbalized understanding and needs further education  HOME EXERCISE PROGRAM: Access Code: [email or web address] URL: [email or web address] Date: 10/15/2022 Prepared by: Gustavus Bryant  Exercises - Shoulder External Rotation and Scapular Retraction with Resistance  - 2 x daily - 5 x weekly - 2 sets - 15 reps - Standing Shoulder Horizontal Abduction with Resistance  - 2 x daily - 5 x weekly - 2 sets - 15 reps - Scapular Wall Slides  - 2 x daily - 5 x weekly - 2 sets - 15 reps  ASSESSMENT:  CLINICAL IMPRESSION:R shoulder symptoms improving, L shoulder not progressing as well.  Initial AROM observed to have increased however no formal measurements taken.  Incorporated PNF patterns into session to improve mobility, strength and function and add functional movement patterns     EVAL - Patient is a 62 y.o. male who was seen today  for physical therapy evaluation and treatment for B shoulder pain and stiffness. He demonstrates decreased A/PROM, a rounded and forward shoulder posturing increased kyphosis. Positive impingement signs noted but RC testing unremarkable for tear/pathology.  Overall RC strength functional but marked posterior shoulder strength deficits present.  Patient is a good candidate for OPPT.  OBJECTIVE IMPAIRMENTS: decreased activity tolerance, decreased knowledge of condition, decreased mobility, decreased ROM, decreased strength, impaired perceived functional ability, impaired UE functional use, postural dysfunction, and pain.   ACTIVITY LIMITATIONS: carrying, lifting, sleeping, and reach over head  REHAB POTENTIAL: Good  CLINICAL DECISION MAKING: Stable/uncomplicated  EVALUATION COMPLEXITY: Low   GOALS: Goals reviewed with patient? No  SHORT TERM GOALS: Target date: 11/05/2022    Patient to demonstrate independence in HEP  Baseline:  [id number] Goal status: MET Pt reports adherence 10/31/22  2.  120d AROM in B flexion and abduction Baseline:  A/PROM Right eval Left eval  Shoulder flexion 120/150d 90/120d  Shoulder extension    Shoulder abduction 70/170d 60/135d  Shoulder adduction    Shoulder internal rotation L5/45 L SI/70  Shoulder external rotation Occiput/50d Occiput/40d   Goal status: INITIAL  3.  6/10 pain Baseline: 7-8/10  Goal status: INITIAL   LONG TERM GOALS: Target date: 11/26/2022    Patient will score at least 62% on FOTO to signify clinically meaningful improvement in functional abilities.   Baseline: 55% Goal status: INITIAL  2.  Increase B shoulder strength to 4/5 Baseline:  MMT Right eval Left eval  Shoulder flexion 4- 4-  Shoulder extension 4- 4-  Shoulder abduction 4- 4-  Shoulder adduction    Shoulder internal rotation 4- 4-  Shoulder external rotation 4- 4-   Goal status: INITIAL  3.  Decrease pain to 4/10 Baseline: 7-8/10 Goal status: INITIAL  4.  Increase AROM to 160d flexion and abduction, ER to C7 and IR to L5 Baseline:  A/PROM Right eval Left eval  Shoulder flexion 120/150d 90/120d  Shoulder extension    Shoulder abduction 70/170d 60/135d  Shoulder adduction    Shoulder internal rotation L5/45 L SI/70  Shoulder external rotation Occiput/50d Occiput/40d   Goal status: INITIAL    PLAN:  PT FREQUENCY: 1-2x/week  PT DURATION: 6 weeks  PLANNED INTERVENTIONS: Therapeutic exercises, Therapeutic activity, Neuromuscular re-education, Balance training, Gait training, Patient/Family education, Self Care, Joint mobilization, Dry Needling, Electrical stimulation, Spinal mobilization, Cryotherapy, Moist heat, Manual therapy, and Re-evaluation  PLAN FOR NEXT SESSION: HEP review and update, manual techniques as appropriate, aerobic tasks, ROM and flexibility activities, strengthening and PREs, TPDN, gait and balance training as needed     Hildred Laser,  PT 11/06/2022, 3:50 PM   Check all possible CPT codes: 54098 - PT Re-evaluation, 97110- Therapeutic Exercise, [phone]- Neuro Re-education, [phone] - Gait Training, [phone] - Manual Therapy, 97530 - Therapeutic Activities, and [phone] - Self Care    Check all conditions that are expected to impact treatment: {Conditions expected to impact treatment:None of these apply   If treatment provided at initial evaluation, no treatment charged due to lack of authorization.

## 2022-11-06 ENCOUNTER — Ambulatory Visit: Payer: Medicaid Other

## 2022-11-06 DIAGNOSIS — R293 Abnormal posture: Secondary | ICD-10-CM | POA: Diagnosis not present

## 2022-11-06 DIAGNOSIS — M6281 Muscle weakness (generalized): Secondary | ICD-10-CM

## 2022-11-06 DIAGNOSIS — M25511 Pain in right shoulder: Secondary | ICD-10-CM | POA: Diagnosis not present

## 2022-11-06 DIAGNOSIS — G8929 Other chronic pain: Secondary | ICD-10-CM | POA: Diagnosis not present

## 2022-11-06 DIAGNOSIS — M25512 Pain in left shoulder: Secondary | ICD-10-CM | POA: Diagnosis not present

## 2022-11-08 ENCOUNTER — Ambulatory Visit: Payer: Medicaid Other

## 2022-11-08 DIAGNOSIS — R293 Abnormal posture: Secondary | ICD-10-CM | POA: Diagnosis not present

## 2022-11-08 DIAGNOSIS — G8929 Other chronic pain: Secondary | ICD-10-CM

## 2022-11-08 DIAGNOSIS — M6281 Muscle weakness (generalized): Secondary | ICD-10-CM

## 2022-11-08 DIAGNOSIS — M25512 Pain in left shoulder: Secondary | ICD-10-CM | POA: Diagnosis not present

## 2022-11-08 DIAGNOSIS — M25511 Pain in right shoulder: Secondary | ICD-10-CM | POA: Diagnosis not present

## 2022-11-08 NOTE — Therapy (Signed)
OUTPATIENT PHYSICAL THERAPY TREATMENT NOTE   Patient Name: GLADE SAN MRN: [id number] DOB:[birth year], 62 y.o., male Today's Date: 11/08/2022  END OF SESSION:  PT End of Session - 11/08/22 1444     Visit Number 5    Number of Visits 12    Date for PT Re-Evaluation 12/15/22    Authorization Type MCD    PT Start Time 1445    PT Stop Time 1523    PT Time Calculation (min) 38 min    Activity Tolerance Patient tolerated treatment well    Behavior During Therapy [facility] for tasks assessed/performed              Past Medical History:  Diagnosis Date   Acute respiratory failure with hypoxia (HCC) 07/25/2021   Arthritis    osteoarthritis and gout   Diabetes mellitus without complication (HCC)    Diverticulitis    Hypertension    Past Surgical History:  Procedure Laterality Date   COLONOSCOPY     HYDROCELE EXCISION / REPAIR     NO PAST SURGERIES     PROCTOSCOPY N/A 02/22/2022   Procedure: RIGID PROCTOSCOPY;  Surgeon: Karie Soda, MD;  Location: [facility];  Service: General;  Laterality: N/A;   XI ROBOTIC ASSISTED LOWER ANTERIOR RESECTION  02/22/2022   for diverticulitis/takedown of colovesical fistula   XI ROBOTIC LAPAROSCOPIC ASSISTED APPENDECTOMY  02/22/2022   Patient Active Problem List   Diagnosis Date Noted   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   Hydrocele, right 01/01/2022   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   Lipoma of transverse colon 01/01/2022   Diastasis recti 01/01/2022   History of adenomatous polyp of colon 01/01/2022   History of recent steroid use 01/01/2022   Nasal mass 01/01/2022   Non-recurrent bilateral inguinal hernia without obstruction or gangrene 01/01/2022   Umbilical hernia without obstruction and without gangrene 01/01/2022   Hypokalemia 12/04/2021   Hypomagnesemia 12/04/2021   Type 2 diabetes mellitus with hyperglycemia (HCC) 12/01/2021   Hypertension 12/01/2021   Diverticulitis of colon with perforation 11/30/2021   Diverticulitis of large intestine  with perforation 07/25/2021   Hypertensive urgency 07/25/2021   SIRS (systemic inflammatory response syndrome) (HCC) 07/25/2021   Leukocytosis 07/25/2021   Tobacco abuse 07/25/2021   Alcohol abuse 07/25/2021   Does not have health insurance 07/25/2021   Obesity (BMI 30-39.9) 07/25/2021   Acute respiratory failure with hypoxia (HCC) 07/25/2021   Polycythemia    Cellulitis 03/18/2020   Hypertriglyceridemia 07/06/2016   Gout 11/16/2015   Essential hypertension 09/11/2013   Type 2 diabetes mellitus without complication, without long-term current use of insulin (HCC) 09/11/2013    PCP: Hoy Register, MD  REFERRING PROVIDER: Hoy Register, MD  REFERRING DIAG: M75.01,M75.02 (ICD-10-CM) - Adhesive capsulitis of both shoulders  THERAPY DIAG:  Chronic pain of both shoulders  Muscle weakness (generalized)  Abnormal posture  Rationale for Evaluation and Treatment: Rehabilitation  ONSET DATE: chronic  SUBJECTIVE:  SUBJECTIVE STATEMENT:  Patient reports continued pain with L>R shoulder, stating it can get to a 10/10. He states that he has trouble sleeping due to the pain in his Lt shoulder.   Hand dominance: Right  PERTINENT HISTORY: TYSEN OLIVIA is a 62 y.o. year old male with a history of type 2 diabetes mellitus (A1c 5.6), hypertension, polycythemia, aortic atherosclerosis, coronary artery calcifications tobacco abuse (2-3 packs of cigarettes/day for close to 50 years)      Interval History: Discussed the use of AI scribe software for clinical note transcription with the patient, who gave verbal consent to proceed.   He presents with low energy and bilateral shoulder pain. He reports a lack of motivation to complete tasks around the house and has to force himself to mow his small yard, often  needing to take breaks. This has been ongoing for approximately two to three years. He has a history of low testosterone in the past.   In addition to the low energy, he has been experiencing bilateral shoulder pain, which he rates as severe. The pain is particularly noticeable when reaching out or up. He has not previously had physical therapy for this issue.   The patient continues to smoke and has recently started drinking coffee with sugar and creamer. He has an increase in his A1c from 5.6 to 6.8 and suspects that the addition of coffee to his diet may be contributing to this. He expresses a desire to quit smoking but feels that he needs to address his low energy levels first.   PAIN:  Are you having pain? Yes: NPRS scale: 7-8/10 Pain location: B shoulders Pain description: ache Aggravating factors: OH reaching and sleep positions Relieving factors: undetermined  PRECAUTIONS: None  RED FLAGS: None   WEIGHT BEARING RESTRICTIONS: No  FALLS:  Has patient fallen in last 6 months? No  OCCUPATION: Not working  PLOF: Independent  PATIENT GOALS: To reduce and manage my shoulder problem  NEXT MD VISIT:   OBJECTIVE:   DIAGNOSTIC FINDINGS:  none  PATIENT SURVEYS:  FOTO 50(62 predicted)  POSTURE: Forward and rounded shoulder.  UPPER EXTREMITY ROM:   A/PROM Right eval Left eval  Shoulder flexion 120/150d 90/120d  Shoulder extension    Shoulder abduction 70/170d 60/135d  Shoulder adduction    Shoulder internal rotation L5/45 L SI/70  Shoulder external rotation Occiput/50d Occiput/40d  Elbow flexion    Elbow extension    Wrist flexion    Wrist extension    Wrist ulnar deviation    Wrist radial deviation    Wrist pronation    Wrist supination    (Blank rows = not tested)  UPPER EXTREMITY MMT:  MMT Right eval Left eval  Shoulder flexion 4- 4-  Shoulder extension 4- 4-  Shoulder abduction 4- 4-  Shoulder adduction    Shoulder internal rotation 4- 4-   Shoulder external rotation 4- 4-  Middle trapezius    Lower trapezius    Elbow flexion    Elbow extension    Wrist flexion    Wrist extension    Wrist ulnar deviation    Wrist radial deviation    Wrist pronation    Wrist supination    Grip strength (lbs)    (Blank rows = not tested)  SHOULDER SPECIAL TESTS: Impingement tests: Neer impingement test: positive B  Rotator cuff assessment: Empty can test: negative   PALPATION:  deferred   TODAY'S TREATMENT:     [facility] Adult PT Treatment:  DATE: 11/08/22 Therapeutic Exercise: Nustep L4 6 min 50 SPM target Rows GTB 2x10 Shoulder extension GTB 2x10 Seated scaption 1# 2x10 OH flexion with stick focus on breathing pattern 10x 4# Chest press with stick and protraction 10x 4# Supine hor abd RTB 2x10 Supine diagonals RTB x10 BIL Seated hor abd RTB 2x10 Seated ER with retraction YTB 2x10   [facility] Adult PT Treatment:                                                DATE: 11/06/22 Therapeutic Exercise: Nustep L4 6 min 50 SPM target OH flexion with stick focus on breathing pattern 10x 4# Chest press with stick and protraction 10x 4# Supine hor abd RTB 15x Seated hor abd RTB 15x Seated ER with retraction YTB 15x Manual Therapy: STM B pec minor 2 min each side B shoulder mobs inf/post/distraction 5x10 each  PNF D1 F/E 15/15                                                                                                                                 PATIENT EDUCATION: Education details: Discussed eval findings, rehab rationale and POC and patient is in agreement  Person educated: Patient Education method: Explanation Education comprehension: verbalized understanding and needs further education  HOME EXERCISE PROGRAM: Access Code: [email or web address] URL: [email or web address] Date: 10/15/2022 Prepared by: Gustavus Bryant  Exercises - Shoulder External Rotation and Scapular  Retraction with Resistance  - 2 x daily - 5 x weekly - 2 sets - 15 reps - Standing Shoulder Horizontal Abduction with Resistance  - 2 x daily - 5 x weekly - 2 sets - 15 reps - Scapular Wall Slides  - 2 x daily - 5 x weekly - 2 sets - 15 reps  ASSESSMENT:  CLINICAL IMPRESSION: Patient presents to PT reporting continued improvements in his Rt shoulder but that the Lt shoulder is still painful and less functional. Session today continued to focus on shoulder ROM as well and RTC and periscapular strengthening. Patient was able to tolerate all prescribed exercises with no adverse effects. Patient continues to benefit from skilled PT services and should be progressed as able to improve functional independence.       EVAL - Patient is a 62 y.o. male who was seen today for physical therapy evaluation and treatment for B shoulder pain and stiffness. He demonstrates decreased A/PROM, a rounded and forward shoulder posturing increased kyphosis. Positive impingement signs noted but RC testing unremarkable for tear/pathology.  Overall RC strength functional but marked posterior shoulder strength deficits present.  Patient is a good candidate for OPPT.  OBJECTIVE IMPAIRMENTS: decreased activity tolerance, decreased knowledge of condition, decreased mobility, decreased ROM, decreased strength, impaired perceived functional ability, impaired UE functional use, postural dysfunction, and pain.   ACTIVITY LIMITATIONS: carrying, lifting,  sleeping, and reach over head  REHAB POTENTIAL: Good  CLINICAL DECISION MAKING: Stable/uncomplicated  EVALUATION COMPLEXITY: Low   GOALS: Goals reviewed with patient? No  SHORT TERM GOALS: Target date: 11/05/2022    Patient to demonstrate independence in HEP  Baseline: [id number] Goal status: MET Pt reports adherence 10/31/22  2.  120d AROM in B flexion and abduction Baseline:  A/PROM Right eval Left eval  Shoulder flexion 120/150d 90/120d  Shoulder extension     Shoulder abduction 70/170d 60/135d  Shoulder adduction    Shoulder internal rotation L5/45 L SI/70  Shoulder external rotation Occiput/50d Occiput/40d   Goal status: INITIAL  3.  6/10 pain Baseline: 7-8/10  Goal status: INITIAL   LONG TERM GOALS: Target date: 11/26/2022    Patient will score at least 62% on FOTO to signify clinically meaningful improvement in functional abilities.   Baseline: 55% Goal status: INITIAL  2.  Increase B shoulder strength to 4/5 Baseline:  MMT Right eval Left eval  Shoulder flexion 4- 4-  Shoulder extension 4- 4-  Shoulder abduction 4- 4-  Shoulder adduction    Shoulder internal rotation 4- 4-  Shoulder external rotation 4- 4-   Goal status: INITIAL  3.  Decrease pain to 4/10 Baseline: 7-8/10 Goal status: INITIAL  4.  Increase AROM to 160d flexion and abduction, ER to C7 and IR to L5 Baseline:  A/PROM Right eval Left eval  Shoulder flexion 120/150d 90/120d  Shoulder extension    Shoulder abduction 70/170d 60/135d  Shoulder adduction    Shoulder internal rotation L5/45 L SI/70  Shoulder external rotation Occiput/50d Occiput/40d   Goal status: INITIAL    PLAN:  PT FREQUENCY: 1-2x/week  PT DURATION: 6 weeks  PLANNED INTERVENTIONS: Therapeutic exercises, Therapeutic activity, Neuromuscular re-education, Balance training, Gait training, Patient/Family education, Self Care, Joint mobilization, Dry Needling, Electrical stimulation, Spinal mobilization, Cryotherapy, Moist heat, Manual therapy, and Re-evaluation  PLAN FOR NEXT SESSION: HEP review and update, manual techniques as appropriate, aerobic tasks, ROM and flexibility activities, strengthening and PREs, TPDN, gait and balance training as needed     Berta Minor, PTA 11/08/2022, 3:25 PM   Check all possible CPT codes: 84166 - PT Re-evaluation, 97110- Therapeutic Exercise, [phone]- Neuro Re-education, [phone] - Gait Training, [phone] - Manual Therapy, 97530 - Therapeutic  Activities, and [phone] - Self Care    Check all conditions that are expected to impact treatment: {Conditions expected to impact treatment:None of these apply   If treatment provided at initial evaluation, no treatment charged due to lack of authorization.

## 2022-11-11 NOTE — Therapy (Unsigned)
OUTPATIENT PHYSICAL THERAPY TREATMENT NOTE   Patient Name: Donald Bender MRN: [id number] DOB:30-Oct-1960, 62 y.o., male Today's Date: 11/12/2022  END OF SESSION:  PT End of Session - 11/12/22 1448     Visit Number 6    Number of Visits 12    Date for PT Re-Evaluation 12/15/22    Authorization Type MCD    PT Start Time 1445    PT Stop Time 1525    PT Time Calculation (min) 40 min    Activity Tolerance Patient tolerated treatment well    Behavior During Therapy [name] for tasks assessed/performed               Past Medical History:  Diagnosis Date   Acute respiratory failure with hypoxia (HCC) 07/25/2021   Arthritis    osteoarthritis and gout   Diabetes mellitus without complication (HCC)    Diverticulitis    Hypertension    Past Surgical History:  Procedure Laterality Date   COLONOSCOPY     HYDROCELE EXCISION / REPAIR     NO PAST SURGERIES     PROCTOSCOPY N/A 02/22/2022   Procedure: RIGID PROCTOSCOPY;  Surgeon: Karie Soda, MD;  Location: [facility];  Service: General;  Laterality: N/A;   XI ROBOTIC ASSISTED LOWER ANTERIOR RESECTION  02/22/2022   for diverticulitis/takedown of colovesical fistula   XI ROBOTIC LAPAROSCOPIC ASSISTED APPENDECTOMY  02/22/2022   Patient Active Problem List   Diagnosis Date Noted   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   Hydrocele, right 01/01/2022   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   Lipoma of transverse colon 01/01/2022   Diastasis recti 01/01/2022   History of adenomatous polyp of colon 01/01/2022   History of recent steroid use 01/01/2022   Nasal mass 01/01/2022   Non-recurrent bilateral inguinal hernia without obstruction or gangrene 01/01/2022   Umbilical hernia without obstruction and without gangrene 01/01/2022   Hypokalemia 12/04/2021   Hypomagnesemia 12/04/2021   Type 2 diabetes mellitus with hyperglycemia (HCC) 12/01/2021   Hypertension 12/01/2021   Diverticulitis of colon with perforation 11/30/2021   Diverticulitis of large intestine  with perforation 07/25/2021   Hypertensive urgency 07/25/2021   SIRS (systemic inflammatory response syndrome) (HCC) 07/25/2021   Leukocytosis 07/25/2021   Tobacco abuse 07/25/2021   Alcohol abuse 07/25/2021   Does not have health insurance 07/25/2021   Obesity (BMI 30-39.9) 07/25/2021   Acute respiratory failure with hypoxia (HCC) 07/25/2021   Polycythemia    Cellulitis 03/18/2020   Hypertriglyceridemia 07/06/2016   Gout 11/16/2015   Essential hypertension 09/11/2013   Type 2 diabetes mellitus without complication, without long-term current use of insulin (HCC) 09/11/2013    PCP: Hoy Register, MD  REFERRING PROVIDER: Hoy Register, MD  REFERRING DIAG: M75.01,M75.02 (ICD-10-CM) - Adhesive capsulitis of both shoulders  THERAPY DIAG:  Chronic pain of both shoulders  Muscle weakness (generalized)  Abnormal posture  Rationale for Evaluation and Treatment: Rehabilitation  ONSET DATE: chronic  SUBJECTIVE:  SUBJECTIVE STATEMENT: R shoulder function improved a bit with PT but L shoulder function essentially unchanged.  Hand dominance: Right  PERTINENT HISTORY: Donald Bender is a 62 y.o. year old male with a history of type 2 diabetes mellitus (A1c 5.6), hypertension, polycythemia, aortic atherosclerosis, coronary artery calcifications tobacco abuse (2-3 packs of cigarettes/day for close to 50 years)      Interval History: Discussed the use of AI scribe software for clinical note transcription with the patient, who gave verbal consent to proceed.   He presents with low energy and bilateral shoulder pain. He reports a lack of motivation to complete tasks around the house and has to force himself to mow his small yard, often needing to take breaks. This has been ongoing for approximately two to  three years. He has a history of low testosterone in the past.   In addition to the low energy, he has been experiencing bilateral shoulder pain, which he rates as severe. The pain is particularly noticeable when reaching out or up. He has not previously had physical therapy for this issue.   The patient continues to smoke and has recently started drinking coffee with sugar and creamer. He has an increase in his A1c from 5.6 to 6.8 and suspects that the addition of coffee to his diet may be contributing to this. He expresses a desire to quit smoking but feels that he needs to address his low energy levels first.   PAIN:  Are you having pain? Yes: NPRS scale: 7-8/10 Pain location: B shoulders Pain description: ache Aggravating factors: OH reaching and sleep positions Relieving factors: undetermined  PRECAUTIONS: None  RED FLAGS: None   WEIGHT BEARING RESTRICTIONS: No  FALLS:  Has patient fallen in last 6 months? No  OCCUPATION: Not working  PLOF: Independent  PATIENT GOALS: To reduce and manage my shoulder problem  NEXT MD VISIT:   OBJECTIVE:   DIAGNOSTIC FINDINGS:  none  PATIENT SURVEYS:  FOTO 50(62 predicted) ; 11/12/22 32  POSTURE: Forward and rounded shoulder.  UPPER EXTREMITY ROM:   A/PROM Right eval Left eval R 11/12/22 L  11/12/22  Shoulder flexion 120/150d 90/120d /135d /135d  Shoulder extension      Shoulder abduction 70/170d 60/135d /160d /150d  Shoulder adduction      Shoulder internal rotation L5/45 L SI/70    Shoulder external rotation Occiput/50d Occiput/40d    Elbow flexion      Elbow extension      Wrist flexion      Wrist extension      Wrist ulnar deviation      Wrist radial deviation      Wrist pronation      Wrist supination      (Blank rows = not tested)  UPPER EXTREMITY MMT:  MMT Right eval Left eval  Shoulder flexion 4- 4-  Shoulder extension 4- 4-  Shoulder abduction 4- 4-  Shoulder adduction    Shoulder internal  rotation 4- 4-  Shoulder external rotation 4- 4-  Middle trapezius    Lower trapezius    Elbow flexion    Elbow extension    Wrist flexion    Wrist extension    Wrist ulnar deviation    Wrist radial deviation    Wrist pronation    Wrist supination    Grip strength (lbs)    (Blank rows = not tested)  SHOULDER SPECIAL TESTS: Impingement tests: Neer impingement test: positive B  Rotator cuff assessment: Empty can test: negative  PALPATION:  deferred   TODAY'S TREATMENT:     [facility] Adult PT Treatment:                                                DATE: 11/12/22 Therapeutic Exercise: UBE L1 3/3 min Doorway stretch 30s x3 Ball roll flexion on wall 15x Scapular wall slides 15x Scaption on wall 15/15 Supine horizontal abduction Supine alternating OH flexion  [facility] Adult PT Treatment:                                                DATE: 11/08/22 Therapeutic Exercise: Nustep L4 6 min 50 SPM target Rows GTB 2x10 Shoulder extension GTB 2x10 Seated scaption 1# 2x10 OH flexion with stick focus on breathing pattern 10x 4# Chest press with stick and protraction 10x 4# Supine hor abd RTB 2x10 Supine diagonals RTB x10 BIL Seated hor abd RTB 2x10 Seated ER with retraction YTB 2x10   [facility] Adult PT Treatment:                                                DATE: 11/06/22 Therapeutic Exercise: Nustep L4 6 min 50 SPM target OH flexion with stick focus on breathing pattern 10x 4# Chest press with stick and protraction 10x 4# Supine hor abd RTB 15x Seated hor abd RTB 15x Seated ER with retraction YTB 15x Manual Therapy: STM B pec minor 2 min each side B shoulder mobs inf/post/distraction 5x10 each  PNF D1 F/E 15/15                                                                                                                                 PATIENT EDUCATION: Education details: Discussed eval findings, rehab rationale and POC and patient is in agreement  Person educated:  Patient Education method: Explanation Education comprehension: verbalized understanding and needs further education  HOME EXERCISE PROGRAM: Access Code: [email or web address] URL: [email or web address] Date: 10/15/2022 Prepared by: Gustavus Bryant  Exercises - Shoulder External Rotation and Scapular Retraction with Resistance  - 2 x daily - 5 x weekly - 2 sets - 15 reps - Standing Shoulder Horizontal Abduction with Resistance  - 2 x daily - 5 x weekly - 2 sets - 15 reps - Scapular Wall Slides  - 2 x daily - 5 x weekly - 2 sets - 15 reps  ASSESSMENT:  CLINICAL IMPRESSION: Mild improvement reported in R shoulder but L shoulder unchanged overall.  FOTO score has declined to 32 from 50.  Adjusted routine to allow for  more AAROM and stretching with focus on posterior shoulder strengthening and B RC        EVAL - Patient is a 62 y.o. male who was seen today for physical therapy evaluation and treatment for B shoulder pain and stiffness. He demonstrates decreased A/PROM, a rounded and forward shoulder posturing increased kyphosis. Positive impingement signs noted but RC testing unremarkable for tear/pathology.  Overall RC strength functional but marked posterior shoulder strength deficits present.  Patient is a good candidate for OPPT.  OBJECTIVE IMPAIRMENTS: decreased activity tolerance, decreased knowledge of condition, decreased mobility, decreased ROM, decreased strength, impaired perceived functional ability, impaired UE functional use, postural dysfunction, and pain.   ACTIVITY LIMITATIONS: carrying, lifting, sleeping, and reach over head  REHAB POTENTIAL: Good  CLINICAL DECISION MAKING: Stable/uncomplicated  EVALUATION COMPLEXITY: Low   GOALS: Goals reviewed with patient? No  SHORT TERM GOALS: Target date: 11/05/2022    Patient to demonstrate independence in HEP  Baseline: [id number] Goal status: MET Pt reports adherence 10/31/22  2.  120d AROM in B flexion and  abduction Baseline:  A/PROM Right eval Left eval R 11/12/22 L  11/12/22  Shoulder flexion 120/150d 90/120d /135d /135d  Shoulder extension      Shoulder abduction 70/170d 60/135d /160d /150d   Goal status: Met  3.  6/10 pain Baseline: 7-8/10; 11/12/22 5/10 Goal status: Met   LONG TERM GOALS: Target date: 11/26/2022    Patient will score at least 62% on FOTO to signify clinically meaningful improvement in functional abilities.   Baseline: 55%; 11/12/22 32% Goal status: INITIAL  2.  Increase B shoulder strength to 4/5 Baseline:  MMT Right eval Left eval  Shoulder flexion 4- 4-  Shoulder extension 4- 4-  Shoulder abduction 4- 4-  Shoulder adduction    Shoulder internal rotation 4- 4-  Shoulder external rotation 4- 4-   Goal status: INITIAL  3.  Decrease pain to 4/10 Baseline: 7-8/10 Goal status: INITIAL  4.  Increase AROM to 160d flexion and abduction, ER to C7 and IR to L5 Baseline:  A/PROM Right eval Left eval  Shoulder flexion 120/150d 90/120d  Shoulder extension    Shoulder abduction 70/170d 60/135d  Shoulder adduction    Shoulder internal rotation L5/45 L SI/70  Shoulder external rotation Occiput/50d Occiput/40d   Goal status: INITIAL    PLAN:  PT FREQUENCY: 1-2x/week  PT DURATION: 6 weeks  PLANNED INTERVENTIONS: Therapeutic exercises, Therapeutic activity, Neuromuscular re-education, Balance training, Gait training, Patient/Family education, Self Care, Joint mobilization, Dry Needling, Electrical stimulation, Spinal mobilization, Cryotherapy, Moist heat, Manual therapy, and Re-evaluation  PLAN FOR NEXT SESSION: HEP review and update, manual techniques as appropriate, aerobic tasks, ROM and flexibility activities, strengthening and PREs, TPDN, gait and balance training as needed     Hildred Laser, PT 11/12/2022, 3:28 PM   Check all possible CPT codes: 54098 - PT Re-evaluation, 97110- Therapeutic Exercise, [phone]- Neuro Re-education, [phone] -  Gait Training, [phone] - Manual Therapy, 97530 - Therapeutic Activities, and 97535 - Self Care    Check all conditions that are expected to impact treatment: {Conditions expected to impact treatment:None of these apply   If treatment provided at initial evaluation, no treatment charged due to lack of authorization.

## 2022-11-12 ENCOUNTER — Ambulatory Visit: Payer: Medicaid Other

## 2022-11-12 DIAGNOSIS — M25512 Pain in left shoulder: Secondary | ICD-10-CM | POA: Diagnosis not present

## 2022-11-12 DIAGNOSIS — R293 Abnormal posture: Secondary | ICD-10-CM | POA: Diagnosis not present

## 2022-11-12 DIAGNOSIS — M25511 Pain in right shoulder: Secondary | ICD-10-CM | POA: Diagnosis not present

## 2022-11-12 DIAGNOSIS — M6281 Muscle weakness (generalized): Secondary | ICD-10-CM | POA: Diagnosis not present

## 2022-11-12 DIAGNOSIS — G8929 Other chronic pain: Secondary | ICD-10-CM

## 2022-11-14 ENCOUNTER — Ambulatory Visit: Payer: Medicaid Other

## 2022-11-14 DIAGNOSIS — M25512 Pain in left shoulder: Secondary | ICD-10-CM | POA: Diagnosis not present

## 2022-11-14 DIAGNOSIS — R293 Abnormal posture: Secondary | ICD-10-CM | POA: Diagnosis not present

## 2022-11-14 DIAGNOSIS — M6281 Muscle weakness (generalized): Secondary | ICD-10-CM | POA: Diagnosis not present

## 2022-11-14 DIAGNOSIS — M25511 Pain in right shoulder: Secondary | ICD-10-CM | POA: Diagnosis not present

## 2022-11-14 DIAGNOSIS — G8929 Other chronic pain: Secondary | ICD-10-CM

## 2022-11-14 NOTE — Therapy (Signed)
OUTPATIENT PHYSICAL THERAPY TREATMENT NOTE   Patient Name: Donald Bender MRN: [id number] DOB:12/09/1960, 62 y.o., male Today's Date: 11/14/2022  END OF SESSION:  PT End of Session - 11/14/22 1449     Visit Number 7    Number of Visits 12    Date for PT Re-Evaluation 12/15/22    Authorization Type MCD    PT Start Time 1445    PT Stop Time 1525    PT Time Calculation (min) 40 min    Activity Tolerance Patient tolerated treatment well    Behavior During Therapy [facility] for tasks assessed/performed                Past Medical History:  Diagnosis Date   Acute respiratory failure with hypoxia (HCC) 07/25/2021   Arthritis    osteoarthritis and gout   Diabetes mellitus without complication (HCC)    Diverticulitis    Hypertension    Past Surgical History:  Procedure Laterality Date   COLONOSCOPY     HYDROCELE EXCISION / REPAIR     NO PAST SURGERIES     PROCTOSCOPY N/A 02/22/2022   Procedure: RIGID PROCTOSCOPY;  Surgeon: Karie Soda, MD;  Location: [facility];  Service: General;  Laterality: N/A;   XI ROBOTIC ASSISTED LOWER ANTERIOR RESECTION  02/22/2022   for diverticulitis/takedown of colovesical fistula   XI ROBOTIC LAPAROSCOPIC ASSISTED APPENDECTOMY  02/22/2022   Patient Active Problem List   Diagnosis Date Noted   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   Hydrocele, right 01/01/2022   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   Lipoma of transverse colon 01/01/2022   Diastasis recti 01/01/2022   History of adenomatous polyp of colon 01/01/2022   History of recent steroid use 01/01/2022   Nasal mass 01/01/2022   Non-recurrent bilateral inguinal hernia without obstruction or gangrene 01/01/2022   Umbilical hernia without obstruction and without gangrene 01/01/2022   Hypokalemia 12/04/2021   Hypomagnesemia 12/04/2021   Type 2 diabetes mellitus with hyperglycemia (HCC) 12/01/2021   Hypertension 12/01/2021   Diverticulitis of colon with perforation 11/30/2021   Diverticulitis of large  intestine with perforation 07/25/2021   Hypertensive urgency 07/25/2021   SIRS (systemic inflammatory response syndrome) (HCC) 07/25/2021   Leukocytosis 07/25/2021   Tobacco abuse 07/25/2021   Alcohol abuse 07/25/2021   Does not have health insurance 07/25/2021   Obesity (BMI 30-39.9) 07/25/2021   Acute respiratory failure with hypoxia (HCC) 07/25/2021   Polycythemia    Cellulitis 03/18/2020   Hypertriglyceridemia 07/06/2016   Gout 11/16/2015   Essential hypertension 09/11/2013   Type 2 diabetes mellitus without complication, without long-term current use of insulin (HCC) 09/11/2013    PCP: Hoy Register, MD  REFERRING PROVIDER: Hoy Register, MD  REFERRING DIAG: M75.01,M75.02 (ICD-10-CM) - Adhesive capsulitis of both shoulders  THERAPY DIAG:  Chronic pain of both shoulders  Muscle weakness (generalized)  Abnormal posture  Rationale for Evaluation and Treatment: Rehabilitation  ONSET DATE: chronic  SUBJECTIVE:  SUBJECTIVE STATEMENT: No increase in symptoms following last session and new tasks, more aggressive stretching and functional activities  Hand dominance: Right  PERTINENT HISTORY: Donald Bender is a 62 y.o. year old male with a history of type 2 diabetes mellitus (A1c 5.6), hypertension, polycythemia, aortic atherosclerosis, coronary artery calcifications tobacco abuse (2-3 packs of cigarettes/day for close to 50 years)      Interval History: Discussed the use of AI scribe software for clinical note transcription with the patient, who gave verbal consent to proceed.   He presents with low energy and bilateral shoulder pain. He reports a lack of motivation to complete tasks around the house and has to force himself to mow his small yard, often needing to take breaks. This has been  ongoing for approximately two to three years. He has a history of low testosterone in the past.   In addition to the low energy, he has been experiencing bilateral shoulder pain, which he rates as severe. The pain is particularly noticeable when reaching out or up. He has not previously had physical therapy for this issue.   The patient continues to smoke and has recently started drinking coffee with sugar and creamer. He has an increase in his A1c from 5.6 to 6.8 and suspects that the addition of coffee to his diet may be contributing to this. He expresses a desire to quit smoking but feels that he needs to address his low energy levels first.   PAIN:  Are you having pain? Yes: NPRS scale: 7-8/10 Pain location: B shoulders Pain description: ache Aggravating factors: OH reaching and sleep positions Relieving factors: undetermined  PRECAUTIONS: None  RED FLAGS: None   WEIGHT BEARING RESTRICTIONS: No  FALLS:  Has patient fallen in last 6 months? No  OCCUPATION: Not working  PLOF: Independent  PATIENT GOALS: To reduce and manage my shoulder problem  NEXT MD VISIT:   OBJECTIVE:   DIAGNOSTIC FINDINGS:  none  PATIENT SURVEYS:  FOTO 50(62 predicted) ; 11/12/22 32  POSTURE: Forward and rounded shoulder.  UPPER EXTREMITY ROM:   A/PROM Right eval Left eval R 11/12/22 L  11/12/22  Shoulder flexion 120/150d 90/120d /135d /135d  Shoulder extension      Shoulder abduction 70/170d 60/135d /160d /150d  Shoulder adduction      Shoulder internal rotation L5/45 L SI/70    Shoulder external rotation Occiput/50d Occiput/40d    Elbow flexion      Elbow extension      Wrist flexion      Wrist extension      Wrist ulnar deviation      Wrist radial deviation      Wrist pronation      Wrist supination      (Blank rows = not tested)  UPPER EXTREMITY MMT:  MMT Right eval Left eval  Shoulder flexion 4- 4-  Shoulder extension 4- 4-  Shoulder abduction 4- 4-  Shoulder  adduction    Shoulder internal rotation 4- 4-  Shoulder external rotation 4- 4-  Middle trapezius    Lower trapezius    Elbow flexion    Elbow extension    Wrist flexion    Wrist extension    Wrist ulnar deviation    Wrist radial deviation    Wrist pronation    Wrist supination    Grip strength (lbs)    (Blank rows = not tested)  SHOULDER SPECIAL TESTS: Impingement tests: Neer impingement test: positive B  Rotator cuff assessment: Empty can test:  negative   PALPATION:  deferred   TODAY'S TREATMENT:     [facility] Adult PT Treatment:                                                DATE: 11/14/22 Therapeutic Exercise: UBE L2 3/3 min Doorway stretch 30s x3 Prone flexion 15x B Prone row 15x B Prone hor abd 15x B Ball roll flexion on wall 15x Scaption on wall 15/15 Supine horizontal abduction RTB 15x B, 15/15 unilaterally Supine alternating OH flexion 2# 15/15   [facility] Adult PT Treatment:                                                DATE: 11/12/22 Therapeutic Exercise: UBE L1 3/3 min Doorway stretch 30s x3 Ball roll flexion on wall 15x Scapular wall slides 15x Scaption on wall 15/15 Supine horizontal abduction Supine alternating OH flexion  [facility] Adult PT Treatment:                                                DATE: 11/08/22 Therapeutic Exercise: Nustep L4 6 min 50 SPM target Rows GTB 2x10 Shoulder extension GTB 2x10 Seated scaption 1# 2x10 OH flexion with stick focus on breathing pattern 10x 4# Chest press with stick and protraction 10x 4# Supine hor abd RTB 2x10 Supine diagonals RTB x10 BIL Seated hor abd RTB 2x10 Seated ER with retraction YTB 2x10   [facility] Adult PT Treatment:                                                DATE: 11/06/22 Therapeutic Exercise: Nustep L4 6 min 50 SPM target OH flexion with stick focus on breathing pattern 10x 4# Chest press with stick and protraction 10x 4# Supine hor abd RTB 15x Seated hor abd RTB 15x Seated ER with retraction  YTB 15x Manual Therapy: STM B pec minor 2 min each side B shoulder mobs inf/post/distraction 5x10 each  PNF D1 F/E 15/15                                                                                                                                 PATIENT EDUCATION: Education details: Discussed eval findings, rehab rationale and POC and patient is in agreement  Person educated: Patient Education method: Explanation Education comprehension: verbalized understanding and needs further education  HOME EXERCISE PROGRAM: Access Code: [email or web address] URL:  [email or web address] Date: 10/15/2022 Prepared by: Gustavus Bryant  Exercises - Shoulder External Rotation and Scapular Retraction with Resistance  - 2 x daily - 5 x weekly - 2 sets - 15 reps - Standing Shoulder Horizontal Abduction with Resistance  - 2 x daily - 5 x weekly - 2 sets - 15 reps - Scapular Wall Slides  - 2 x daily - 5 x weekly - 2 sets - 15 reps  ASSESSMENT:  CLINICAL IMPRESSION: Despite unchanging pain levels, patient demonstrating increased AROM as well as improved scapular mechanics.  Marked posterior shoulder weakness evident.  Encouraged patient to continue OPPT to maintain benefits gained to date while he considers a CSI.      EVAL - Patient is a 62 y.o. male who was seen today for physical therapy evaluation and treatment for B shoulder pain and stiffness. He demonstrates decreased A/PROM, a rounded and forward shoulder posturing increased kyphosis. Positive impingement signs noted but RC testing unremarkable for tear/pathology.  Overall RC strength functional but marked posterior shoulder strength deficits present.  Patient is a good candidate for OPPT.  OBJECTIVE IMPAIRMENTS: decreased activity tolerance, decreased knowledge of condition, decreased mobility, decreased ROM, decreased strength, impaired perceived functional ability, impaired UE functional use, postural dysfunction, and pain.   ACTIVITY  LIMITATIONS: carrying, lifting, sleeping, and reach over head  REHAB POTENTIAL: Good  CLINICAL DECISION MAKING: Stable/uncomplicated  EVALUATION COMPLEXITY: Low   GOALS: Goals reviewed with patient? No  SHORT TERM GOALS: Target date: 11/05/2022    Patient to demonstrate independence in HEP  Baseline: [id number] Goal status: MET Pt reports adherence 10/31/22  2.  120d AROM in B flexion and abduction Baseline:  A/PROM Right eval Left eval R 11/12/22 L  11/12/22  Shoulder flexion 120/150d 90/120d /135d /135d  Shoulder extension      Shoulder abduction 70/170d 60/135d /160d /150d   Goal status: Met  3.  6/10 pain Baseline: 7-8/10; 11/12/22 5/10 Goal status: Met   LONG TERM GOALS: Target date: 11/26/2022    Patient will score at least 62% on FOTO to signify clinically meaningful improvement in functional abilities.   Baseline: 55%; 11/12/22 32% Goal status: INITIAL  2.  Increase B shoulder strength to 4/5 Baseline:  MMT Right eval Left eval  Shoulder flexion 4- 4-  Shoulder extension 4- 4-  Shoulder abduction 4- 4-  Shoulder adduction    Shoulder internal rotation 4- 4-  Shoulder external rotation 4- 4-   Goal status: INITIAL  3.  Decrease pain to 4/10 Baseline: 7-8/10 Goal status: INITIAL  4.  Increase AROM to 160d flexion and abduction, ER to C7 and IR to L5 Baseline:  A/PROM Right eval Left eval  Shoulder flexion 120/150d 90/120d  Shoulder extension    Shoulder abduction 70/170d 60/135d  Shoulder adduction    Shoulder internal rotation L5/45 L SI/70  Shoulder external rotation Occiput/50d Occiput/40d   Goal status: INITIAL    PLAN:  PT FREQUENCY: 1-2x/week  PT DURATION: 6 weeks  PLANNED INTERVENTIONS: Therapeutic exercises, Therapeutic activity, Neuromuscular re-education, Balance training, Gait training, Patient/Family education, Self Care, Joint mobilization, Dry Needling, Electrical stimulation, Spinal mobilization, Cryotherapy, Moist  heat, Manual therapy, and Re-evaluation  PLAN FOR NEXT SESSION: HEP review and update, manual techniques as appropriate, aerobic tasks, ROM and flexibility activities, strengthening and PREs, TPDN, gait and balance training as needed     Hildred Laser, PT 11/14/2022, 3:48 PM   Check all possible CPT codes: 29528 - PT Re-evaluation, 97110- Therapeutic Exercise, [phone]- Neuro Re-education,  46962 - Gait Training, 95284 - Manual Therapy, [id number] - Therapeutic Activities, and [phone] - Self Care    Check all conditions that are expected to impact treatment: {Conditions expected to impact treatment:None of these apply   If treatment provided at initial evaluation, no treatment charged due to lack of authorization.

## 2022-11-16 ENCOUNTER — Encounter: Payer: Self-pay | Admitting: Family Medicine

## 2022-11-16 DIAGNOSIS — E559 Vitamin D deficiency, unspecified: Secondary | ICD-10-CM | POA: Insufficient documentation

## 2022-11-22 ENCOUNTER — Telehealth: Payer: Self-pay | Admitting: Plastic Surgery

## 2022-11-22 NOTE — Telephone Encounter (Signed)
called 11-22-22 lvmail about numbing cream and time

## 2022-11-23 ENCOUNTER — Encounter: Payer: Self-pay | Admitting: Plastic Surgery

## 2022-11-23 ENCOUNTER — Ambulatory Visit (INDEPENDENT_AMBULATORY_CARE_PROVIDER_SITE_OTHER): Payer: Self-pay | Admitting: Plastic Surgery

## 2022-11-23 DIAGNOSIS — L711 Rhinophyma: Secondary | ICD-10-CM

## 2022-11-23 DIAGNOSIS — L719 Rosacea, unspecified: Secondary | ICD-10-CM | POA: Insufficient documentation

## 2022-11-23 NOTE — Progress Notes (Signed)
Preoperative Dx: Rosacea and rhinophyma  Postoperative Dx:  same  Procedure: laser to face and nose  Anesthesia: none  Description of Procedure:  Risks and complications were explained to the patient. Consent was confirmed and signed. Eye protection was placed. Time out was called and all information was confirmed to be correct. The area  area was prepped with alcohol and wiped dry. The Heroic laser was set at 532 nm at 8 J/cm2. The face and nose were lasered. The patient tolerated the procedure well and there were no complications. The patient is to follow up in 4 weeks.

## 2022-12-05 ENCOUNTER — Telehealth: Payer: Self-pay

## 2022-12-05 NOTE — Telephone Encounter (Unsigned)
Copied from CRM [id number]. Topic: General - Other >> Dec 05, 2022  1:51 PM [place] B wrote: Reason for CRM: pt called in about getting Jaudiance med, [employer] , started back up the the pt assistance program thru, [employer] company, that he said he was getting for free for 3months at a time with, he also wants a cb about when he will get his next testorone shot.

## 2022-12-06 ENCOUNTER — Other Ambulatory Visit: Payer: Self-pay

## 2022-12-06 DIAGNOSIS — E119 Type 2 diabetes mellitus without complications: Secondary | ICD-10-CM

## 2022-12-06 MED ORDER — EMPAGLIFLOZIN 25 MG PO TABS
25.0000 mg | ORAL_TABLET | Freq: Every day | ORAL | 1 refills | Status: DC
  Filled 2022-12-06: qty 90, 90d supply, fill #0

## 2022-12-06 NOTE — Telephone Encounter (Signed)
Pt has been informed.

## 2022-12-06 NOTE — Telephone Encounter (Signed)
Attempted to contact patient and get more information regarding medication.   Tresa Endo do you know anything about this patients assistance for his medication.

## 2022-12-06 NOTE — Telephone Encounter (Signed)
Patient is returning your call    empagliflozin (JARDIANCE) 25 MG TABS tablet  Patient was getting it free through this company " [employer] ph# [phone].. Dr needs to send a new prescription to this company because it expired.   So that he can continue getting it for free.

## 2022-12-07 ENCOUNTER — Other Ambulatory Visit: Payer: Self-pay

## 2022-12-24 ENCOUNTER — Ambulatory Visit: Payer: Medicaid Other | Admitting: Family Medicine

## 2022-12-25 ENCOUNTER — Encounter: Payer: Self-pay | Admitting: Plastic Surgery

## 2022-12-25 ENCOUNTER — Ambulatory Visit (INDEPENDENT_AMBULATORY_CARE_PROVIDER_SITE_OTHER): Payer: Self-pay | Admitting: Plastic Surgery

## 2022-12-25 DIAGNOSIS — L719 Rosacea, unspecified: Secondary | ICD-10-CM

## 2022-12-25 NOTE — Progress Notes (Signed)
Preoperative Dx: rhinophyma and rosacea  Postoperative Dx:  same  Procedure: laser to face   Anesthesia: none  Description of Procedure:  Risks and complications were explained to the patient. Consent was confirmed and signed. Eye protection was placed. Time out was called and all information was confirmed to be correct. The area  area was prepped with alcohol and wiped dry. The Heroic laser was set at presettings with 532 nm and 2.5 j. The face was lasered. The patient tolerated the procedure well and there were no complications. The patient is to follow up in 4 weeks.

## 2023-01-07 ENCOUNTER — Other Ambulatory Visit: Payer: Self-pay

## 2023-01-09 ENCOUNTER — Other Ambulatory Visit: Payer: Self-pay

## 2023-01-29 ENCOUNTER — Ambulatory Visit (INDEPENDENT_AMBULATORY_CARE_PROVIDER_SITE_OTHER): Payer: Self-pay | Admitting: Plastic Surgery

## 2023-01-29 ENCOUNTER — Encounter: Payer: Self-pay | Admitting: Plastic Surgery

## 2023-01-29 DIAGNOSIS — L711 Rhinophyma: Secondary | ICD-10-CM

## 2023-01-29 DIAGNOSIS — L719 Rosacea, unspecified: Secondary | ICD-10-CM

## 2023-01-29 NOTE — Progress Notes (Signed)
 Preoperative Dx: Rhinophyma and rosacea  Postoperative Dx:  same  Procedure: laser to face  Anesthesia: none  Description of Procedure:  Risks and complications were explained to the patient. Consent was confirmed and signed. Eye protection was placed. Time out was called and all information was confirmed to be correct. The area  area was prepped with alcohol and wiped dry. The heroic laser was set at presettings with the 532 nm.  The face was lasered. Additionally, the nose was done with 8 J.  The patient tolerated the procedure well and there were no complications. The patient is to follow up in 4 weeks.

## 2023-02-27 ENCOUNTER — Ambulatory Visit: Payer: Medicaid Other | Attending: Family Medicine | Admitting: Family Medicine

## 2023-02-28 ENCOUNTER — Ambulatory Visit: Payer: Self-pay | Admitting: Plastic Surgery

## 2023-02-28 DIAGNOSIS — L711 Rhinophyma: Secondary | ICD-10-CM

## 2023-03-01 ENCOUNTER — Ambulatory Visit: Payer: Medicaid Other | Admitting: Physician Assistant

## 2023-03-01 ENCOUNTER — Other Ambulatory Visit: Payer: Medicaid Other | Admitting: Plastic Surgery

## 2023-03-01 ENCOUNTER — Other Ambulatory Visit: Payer: Self-pay

## 2023-03-01 VITALS — BP 190/96 | HR 89

## 2023-03-01 DIAGNOSIS — L711 Rhinophyma: Secondary | ICD-10-CM

## 2023-03-01 NOTE — Progress Notes (Signed)
Patient is a pleasant 63 year old male with history of rhinophyma s/p excision with #10 blade with application of myriad covered with Adaptic and K-Y jelly who presents to clinic for postprocedural check-in and dressing change.  Today, patient is doing well.  He is accompanied by his wife at bedside.  They present for initial dressing change.  His blood pressure is markedly elevated on arrival.  He reports that it has been elevated recently, but not that high.  Adamantly denies any chest pain, difficulty breathing, leg swelling, blurred vision or headache, or other symptoms.  Emphasized the importance of improved blood pressure control.  He told me that he had a cigarette immediately prior to arrival.  There is also a degree of discomfort and anxiety regarding today's appointment and yesterday's procedure.  Overlying dressing is carefully removed at bedside without complication or difficulty.  There is no rebleeding, fortunately.  The Adaptic is flat and well-positioned over the underlying myriad.  No surrounding skin or tissue concerns.  Myriad's visualized underneath the Adaptic.    Applied a copious amount of K-Y jelly followed by cut-to-size nonadherent pad.  This was then secured with thin strips of Medipore tape.  Wife had near syncopal episode at bedside, but quickly rebounded after a minute of rest and cold water.  They were each able to ambulate out of the room without difficulty or symptoms.  Emphasized the importance of close outpatient follow-up with his PCP regarding his recently elevated blood pressures for more adequate BP control.  However, given lack of any cardiac symptoms in conjunction with immediate post procedure and nicotine use PTA will not send to [facility] as low suspicion for ACS.  Follow-up on Tuesday for reevaluation and dressing change.  However, spoke with Dr. Ulice Bold and will consider leaving Adaptic for an additional 5 to 7 days if it is firmly in place to avoid disturbing  underlying myriad.  They will be ordering supplies from [employer] including K-Y jelly, nonadherent pads, and Medipore tape.  Photos to be obtained at next visit.

## 2023-03-01 NOTE — Progress Notes (Signed)
Procedure Note  Preoperative Dx: Rhinophyma  Postoperative Dx: Same  Procedure: Excision of rhinophyma  Anesthesia: Lidocaine 1% with 1:100,000 epinephrine and marcaine 0.25%   Description of Procedure: Risks and complications were explained to the patient.  Consent was confirmed and the patient understands the risks and benefits.  The potential complications and alternatives were explained and the patient consents.  The patient expressed understanding the option of not having the procedure and the risks of a scar.  Time out was called and all information was confirmed to be correct.    Lidocaine cream was applied to the nose.  After waiting 30 minutes the area was prepped and drapped.  Lidocaine 1% with epinephrine was injected in the subcutaneous area.  After waiting several minutes for the local to take affect a #10 blade was used to excise the layers of skin to we got to healthy bleeding tissue.  The skin was very thick and strong.  The Bovie was used for brisk bleeders.  Lidocaine with epi soaked gauze was then applied.  Arista was placed which seemed to help a great deal.  The laser was then done and set at 100 m. A donated myriad sheet and powder was applied and adaptic placed over it. KY gel and a gauze dressing was applied.  The patient was given instructions on how to care for the area and a follow up appointment.  Donald Bender tolerated the procedure well and there were no complications.

## 2023-03-05 ENCOUNTER — Ambulatory Visit (INDEPENDENT_AMBULATORY_CARE_PROVIDER_SITE_OTHER): Payer: Medicaid Other | Admitting: Student

## 2023-03-05 VITALS — BP 150/82 | HR 95

## 2023-03-05 DIAGNOSIS — L711 Rhinophyma: Secondary | ICD-10-CM

## 2023-03-05 NOTE — Progress Notes (Signed)
 Patient is a 63 year old male who recently underwent excision of rhinophyma with Dr. Lowery on 02/28/2023.  Patient is about 5 days out from his procedure.  He presents to the clinic today for postprocedural follow-up.  Patient was last seen in the clinic on 03/01/2023.  At this visit, overlying dressing was carefully removed at bedside.  There is no rebleeding.  The Adaptic was flat and well-positioned over the underlying area.  A copious amount of K-Y jelly was applied to the Adaptic followed by a cut to size nonadherent pad.  It was then secured in place with thin strips of Medipore tape.  Today, patient reports with his wife at bedside.  He reports he is doing well.  He reports that there has been a little bit of drainage on the dressings.  Denies any pain or significant discomfort.  Denies any fevers or chills.  Denies any other concerns at this time.  He reports that he has been changing the Telfa daily and applying K-Y jelly daily to the Adaptic.  Chaperone present on exam.  On exam, patient is sitting upright in no acute distress.  Dressings are in place and are clean dry and intact.  The Telfa was removed without any difficulty.  Adaptic in place over the entirety of the nose.  There still appears to be some unincorporated myriad underneath the central aspect of the Adaptic.  The Adaptic to the left side of the nose appeared to be somewhat desiccated.  This area of the Adaptic was cut and replaced with fresh Adaptic.  Patient tolerated well.  There is no active drainage or bleeding on exam.  No signs of infection.  Recommended that patient continue to apply K-Y jelly, nonstick gauze and Medipore tape daily over the Adaptic.  Discussed with him that we will have him come in later this week to have the Adaptic removed and a fresh Adaptic placed.  Patient expressed understanding was in agreement with this.  I instructed the patient to call in the meantime if he has any questions or concerns about  anything.

## 2023-03-08 ENCOUNTER — Ambulatory Visit (INDEPENDENT_AMBULATORY_CARE_PROVIDER_SITE_OTHER): Payer: Self-pay | Admitting: Student

## 2023-03-08 DIAGNOSIS — L711 Rhinophyma: Secondary | ICD-10-CM

## 2023-03-08 NOTE — Progress Notes (Signed)
 Patient is a 63 year old male who recently underwent excision of rhinophyma with Dr. Lowery on 02/28/2023.  Patient is 8 days out from his procedure.  He presents to the clinic today for postprocedural follow-up.     Patient was last seen in the clinic on 03/05/2023.  At this visit, patient reported he was doing well.  On exam, Adaptic was in place over the entirety of the nose.  There did appear to be some unincorporated myriad underneath the Adaptic centrally.  Plan was for patient to come in later this week to have the Adaptic change.  Today, patient presents with his wife at bedside.  He states that he is doing well.  He states that last night, the Adaptic fell off.  He denies any other issues or concerns.  On exam, patient is sitting upright in no acute distress.  Dressings were removed from the nose.  It appears that there is some desiccated myriad noted to the central part of the nose.  This was removed without any difficulty.  Patient tolerated well.  There does appear to still be some [employer] semiincorporated into the wound.  Otherwise appears to be healthy appearing tissue throughout the wound.  No signs of infection on exam.  Adaptic and K-Y jelly and Telfa were applied to the wound.  Recommended that patient continue with dressing changes daily by cleaning the area with Vashe, applying Adaptic, K-Y jelly, Telfa and Medipore strips.  Patient expressed understanding.  We will plan to see the patient back next week.  Instructed him to call if he has any questions or concerns about anything.  Pictures were obtained of the patient and placed in the chart with the patient's or guardian's permission.

## 2023-03-12 ENCOUNTER — Other Ambulatory Visit: Payer: Self-pay

## 2023-03-15 ENCOUNTER — Encounter: Payer: Medicaid Other | Admitting: Student

## 2023-03-15 ENCOUNTER — Other Ambulatory Visit: Payer: Self-pay | Admitting: Family Medicine

## 2023-03-15 DIAGNOSIS — E119 Type 2 diabetes mellitus without complications: Secondary | ICD-10-CM

## 2023-03-15 NOTE — Telephone Encounter (Signed)
Requested medication (s) are due for refill today: yes/norvasc - expired medication date  Requested medication (s) are on the active medication list: yes   Last refill:  jardiance- 12/06/22 #90 1 refills , norvasc- 01/04/22 #90 3 refills   Future visit scheduled: yes in 4 days   Notes to clinic:  expired medication - norvasc last ordered by Consuella Lose K. Selena Batten, MD 01/04/22. Do you want to refill rxs?     Requested Prescriptions  Pending Prescriptions Disp Refills   empagliflozin (JARDIANCE) 25 MG TABS tablet 90 tablet 1    Sig: Take 1 tablet (25 mg total) by mouth daily before breakfast.     Endocrinology:  Diabetes - SGLT2 Inhibitors Failed - 03/15/2023  1:57 PM      Failed - Cr in normal range and within 360 days    Creat  Date Value Ref Range Status  11/21/2015 0.64 (L) 0.70 - 1.33 mg/dL Final    Comment:      For patients > or = 63 years of age: The upper reference limit for Creatinine is approximately 13% higher for people identified as African-American.      Creatinine, Ser  Date Value Ref Range Status  09/24/2022 0.69 (L) 0.76 - 1.27 mg/dL Final   Creatinine, Urine  Date Value Ref Range Status  11/21/2015 166 20 - 370 mg/dL Final         Passed - HBA1C is between 0 and 7.9 and within 180 days    HbA1c, POC (controlled diabetic range)  Date Value Ref Range Status  09/24/2022 6.8 0.0 - 7.0 % Final         Passed - eGFR in normal range and within 360 days    GFR, Est African American  Date Value Ref Range Status  11/21/2015 >89 >=60 mL/min Final   GFR calc Af Amer  Date Value Ref Range Status  01/24/2017 136 >59 mL/min/1.73 Final   GFR, Est Non African American  Date Value Ref Range Status  11/21/2015 >89 >=60 mL/min Final   GFR, Estimated  Date Value Ref Range Status  02/23/2022 >60 >60 mL/min Final    Comment:    (NOTE) Calculated using the CKD-EPI Creatinine Equation (2021)    eGFR  Date Value Ref Range Status  09/24/2022 105 >59 mL/min/1.73 Final          Passed - Valid encounter within last 6 months    Recent Outpatient Visits           5 months ago Hypogonadism in male   [facility] [place] Hoy Register, MD   5 months ago Type 2 diabetes mellitus with other specified complication, without long-term current use of insulin (HCC)   [facility] [place]. [facility] Hoy Register, MD   11 months ago Smoking greater than 20 pack years   [facility] Dept Of [place]. [facility] Hoy Register, MD   1 year ago Type 2 diabetes mellitus with other specified complication, without long-term current use of insulin (HCC)   [facility] Dept Of [place]. [facility] Hoy Register, MD   1 year ago Tobacco abuse   [facility] Grayce Sessions, NP       Future Appointments  In 4 days Hoy Register, MD [facility] - [facility] Dept Of [place]. [facility]   In 1 week Hoy Register, MD [facility] - [facility] Dept Of [place]. [facility]             amLODipine (NORVASC) 10 MG tablet 90 tablet 3    Sig: Take 1 tablet (10 mg total) by mouth daily.     Cardiovascular: Calcium Channel Blockers 2 Failed - 03/15/2023  1:57 PM      Failed - Last BP in normal range    BP Readings from Last 1 Encounters:  03/05/23 (!) 150/82         Passed - Last Heart Rate in normal range    Pulse Readings from Last 1 Encounters:  03/05/23 95         Passed - Valid encounter within last 6 months    Recent Outpatient Visits           5 months ago Hypogonadism in male   [facility] Dept Of [place]. [facility] Hoy Register, MD   5 months ago Type 2 diabetes mellitus with other specified complication, without long-term current use of insulin (HCC)   [facility] Dept Of [place]. [facility] Hoy Register, MD   11 months ago Smoking greater than 20 pack years   [facility] Dept Of [place]. [facility] Hoy Register, MD   1 year ago Type 2 diabetes mellitus with other specified complication, without long-term current use of insulin (HCC)   [facility] Dept Of [place]. [facility] Hoy Register, MD   1 year ago Tobacco abuse   [facility] Grayce Sessions, NP       Future Appointments             In 4 days Hoy Register, MD [facility] - A Dept Of [place]. [facility]   In 1 week Hoy Register, MD [facility] [place] - A Dept Of [place]. [facility]

## 2023-03-15 NOTE — Telephone Encounter (Signed)
Copied from CRM [id number]. Topic: Clinical - Medication Refill >> Mar 15, 2023 12:47 PM Phill Myron wrote: Most Recent Primary Care Visit:  Provider: Vic Blackbird  Department[facility]  Visit Type: NURSE VISIT  Date: 10/09/2022  Medication:  amLODipine (NORVASC) 10 MG tablet empagliflozin (JARDIANCE) 25 MG TABS tablet   Has the patient contacted their pharmacy? No (Agent: If no, request that the patient contact the pharmacy for the refill. If patient does not wish to contact the pharmacy document the reason why and proceed with request.) (Agent: If yes, when and what did the pharmacy advise?)  Is this the correct pharmacy for this prescription? Yes If no, delete pharmacy and type the correct one.  This is the patient's preferred pharmacy:  [facility] - [facility] [address] [place] [address] Phone: [phone] Fax: [phone]  [facility] [address] [place]), [place] - [address] [place] ([place]) [place] [address] Phone: [phone] Fax: [phone]  [facility] [address] [place] [address] Phone: [phone] Fax: [phone]     Is the patient out of the medication? Yes  Has the patient been seen for an appointment in the last year OR does the patient have an upcoming appointment? Yes  Can we respond through MyChart? No  Agent: Please be advised that Rx refills may take up to 3 business days. We ask that you follow-up with your pharmacy.

## 2023-03-18 ENCOUNTER — Other Ambulatory Visit: Payer: Self-pay

## 2023-03-18 ENCOUNTER — Encounter: Payer: Self-pay | Admitting: Student

## 2023-03-18 ENCOUNTER — Ambulatory Visit (INDEPENDENT_AMBULATORY_CARE_PROVIDER_SITE_OTHER): Payer: Medicaid Other | Admitting: Student

## 2023-03-18 VITALS — BP 173/82 | HR 98

## 2023-03-18 DIAGNOSIS — L711 Rhinophyma: Secondary | ICD-10-CM

## 2023-03-18 MED ORDER — EMPAGLIFLOZIN 25 MG PO TABS
25.0000 mg | ORAL_TABLET | Freq: Every day | ORAL | 0 refills | Status: DC
  Filled 2023-03-18: qty 30, 30d supply, fill #0

## 2023-03-18 MED ORDER — AMLODIPINE BESYLATE 10 MG PO TABS
10.0000 mg | ORAL_TABLET | Freq: Every day | ORAL | 0 refills | Status: DC
  Filled 2023-03-18: qty 30, 30d supply, fill #0

## 2023-03-18 NOTE — Progress Notes (Signed)
 Patient is a 63 year old male who recently underwent excision of rhinophyma with Dr. Ulice Bold on 02/28/2023.  Patient is a little over 2 weeks out from his procedure.  He presents to the clinic today for postprocedural follow-up.  Patient was last seen in the clinic on 03/08/2023.  At this visit, patient reported he was doing well.  On exam, dressings were removed.  There was some desiccated myriad noted centrally which was removed.  There appeared to be healthy appearing tissue throughout the wound.  Today, patient reports he is doing well.  He denies any new issues or concerns today.  Reports that he is overall very happy with his results.  Denies any fevers or chills.  Chaperone present on exam.  On exam, patient is sitting upright in no acute distress.  Nose appears to be almost completely healed.  There is a small area of superficial scabbing noted centrally.  There is no significant tenderness to palpation.  No signs of infection.  Patient's blood pressure is also elevated in clinic today.  I recommended he follow-up with his primary care provider in regards to this.  He expressed understanding.  He states he has an appointment with his PCP later this week.  I recommended that the patient apply Vaseline to his nose daily.  Patient expressed understanding.  Recommended that he follow-up with Dr. Ulice Bold in about a month or so for reevaluation.  I instructed him to call in the meantime if he has any questions or concerns about anything.  Pictures were obtained of the patient and placed in the chart with the patient's or guardian's permission.

## 2023-03-19 ENCOUNTER — Encounter: Payer: Self-pay | Admitting: Family Medicine

## 2023-03-19 ENCOUNTER — Other Ambulatory Visit: Payer: Self-pay

## 2023-03-19 ENCOUNTER — Ambulatory Visit: Payer: Medicaid Other | Attending: Family Medicine | Admitting: Family Medicine

## 2023-03-19 VITALS — BP 146/77 | HR 81 | Ht 75.0 in | Wt 243.0 lb

## 2023-03-19 DIAGNOSIS — M25511 Pain in right shoulder: Secondary | ICD-10-CM

## 2023-03-19 DIAGNOSIS — M7501 Adhesive capsulitis of right shoulder: Secondary | ICD-10-CM

## 2023-03-19 DIAGNOSIS — E559 Vitamin D deficiency, unspecified: Secondary | ICD-10-CM

## 2023-03-19 DIAGNOSIS — M7502 Adhesive capsulitis of left shoulder: Secondary | ICD-10-CM

## 2023-03-19 DIAGNOSIS — E1159 Type 2 diabetes mellitus with other circulatory complications: Secondary | ICD-10-CM

## 2023-03-19 DIAGNOSIS — I10 Essential (primary) hypertension: Secondary | ICD-10-CM | POA: Diagnosis not present

## 2023-03-19 DIAGNOSIS — Z7984 Long term (current) use of oral hypoglycemic drugs: Secondary | ICD-10-CM

## 2023-03-19 DIAGNOSIS — E119 Type 2 diabetes mellitus without complications: Secondary | ICD-10-CM

## 2023-03-19 DIAGNOSIS — M1A0710 Idiopathic chronic gout, right ankle and foot, without tophus (tophi): Secondary | ICD-10-CM | POA: Diagnosis not present

## 2023-03-19 DIAGNOSIS — M25512 Pain in left shoulder: Secondary | ICD-10-CM

## 2023-03-19 DIAGNOSIS — E291 Testicular hypofunction: Secondary | ICD-10-CM

## 2023-03-19 LAB — POCT GLYCOSYLATED HEMOGLOBIN (HGB A1C): HbA1c, POC (controlled diabetic range): 7.3 % — AB (ref 0.0–7.0)

## 2023-03-19 MED ORDER — VALSARTAN 160 MG PO TABS
160.0000 mg | ORAL_TABLET | Freq: Every day | ORAL | 1 refills | Status: DC
  Filled 2023-03-19: qty 90, 90d supply, fill #0

## 2023-03-19 MED ORDER — AMLODIPINE BESYLATE 10 MG PO TABS
10.0000 mg | ORAL_TABLET | Freq: Every day | ORAL | 1 refills | Status: DC
  Filled 2023-03-19 – 2023-04-16 (×2): qty 90, 90d supply, fill #0

## 2023-03-19 MED ORDER — ALLOPURINOL 300 MG PO TABS
300.0000 mg | ORAL_TABLET | Freq: Every day | ORAL | 1 refills | Status: DC
  Filled 2023-03-19 – 2023-05-06 (×2): qty 90, 90d supply, fill #0

## 2023-03-19 MED ORDER — METFORMIN HCL 1000 MG PO TABS
1000.0000 mg | ORAL_TABLET | Freq: Two times a day (BID) | ORAL | 1 refills | Status: DC
  Filled 2023-03-19 – 2023-04-16 (×2): qty 180, 90d supply, fill #0

## 2023-03-19 MED ORDER — TESTOSTERONE CYPIONATE 200 MG/ML IM SOLN
200.0000 mg | INTRAMUSCULAR | 5 refills | Status: DC
  Filled 2023-03-19: qty 1, 28d supply, fill #0
  Filled 2023-04-22: qty 1, 28d supply, fill #1
  Filled 2023-05-23: qty 1, 28d supply, fill #2

## 2023-03-19 MED ORDER — ERGOCALCIFEROL 1.25 MG (50000 UT) PO CAPS
50000.0000 [IU] | ORAL_CAPSULE | ORAL | 1 refills | Status: DC
  Filled 2023-03-19 – 2023-04-16 (×2): qty 12, 84d supply, fill #0
  Filled 2023-07-22: qty 12, 84d supply, fill #1

## 2023-03-19 NOTE — Patient Instructions (Signed)
 History of Present Illness The patient, with a history of gout, hypertension, and hypogonadism, presents with bilateral shoulder pain. He reports that physical therapy has improved the left shoulder pain, but the right shoulder pain persists. He is interested in a cortisone injection for the right shoulder pain.  The patient also reports persistent low energy despite testosterone replacement therapy. He has only received one injection and missed a follow-up appointment in December. He is unsure of the refill process and has not picked up additional doses from the pharmacy.  The patient has been taking vitamin D weekly and is due for a refill soon. He has not had any recent gout flares and believes the gout medication is effective. He also reports that his blood pressure has been running high, despite taking losartan.  Physical Exam MUSCULOSKELETAL: Right shoulder pain on movement. Left shoulder improved with therapy.  Results LABS Hemoglobin A1c: 7.3  Assessment and Plan Bilateral Shoulder Pain Persistent despite physical therapy. More severe on the right side. -Refer to Orthopedics for evaluation and possible cortisone injection.  Testosterone Deficiency Patient reports persistent symptoms of low energy. Only one injection received so far due to misunderstanding of refill process. -Clarified refill process with patient. -Advise patient to pick up testosterone from pharmacy and bring to clinic for injection every 28 days.  Vitamin D Deficiency Patient is compliant with weekly Vitamin D supplementation and is nearing end of current supply. -Refill Vitamin D prescription. -Order Vitamin D level.  Gout No recent flares reported. Medication supply running low. -Refill gout medication.  Hypertension Blood pressure slightly elevated. Patient recently smoked. -Change losartan to valsartan 160mg  daily. -Recheck blood pressure at each visit.  Type 2 Diabetes Mellitus A1c increased from  6.8 to 7.3. Patient admits to possible dietary indiscretions. -Advise patient to improve diet, specifically reduce intake of sweets. -Continue current medication (Jardiance). -Schedule follow-up visit in 3 months to monitor A1c.  General Health Maintenance -Order blood work today.

## 2023-03-19 NOTE — Progress Notes (Signed)
 Subjective:  Patient ID: Donald Bender, male    DOB: 1960-07-18  Age: 63 y.o. MRN: [id number]  CC: Medical Management of Chronic Issues (Shoulder pain/Discuss testosterone )   HPI Donald Bender is a 63 y.o. year old male with a history of type 2 diabetes mellitus (A1c 7.3), hypertension, polycythemia, aortic atherosclerosis, coronary artery calcifications tobacco abuse (2-3 packs of cigarettes/day for close to 50 years), hypogonadism.  Interval History: Discussed the use of AI scribe software for clinical note transcription with the patient, who gave verbal consent to proceed.  He presents with bilateral shoulder pain. He reports that physical therapy has improved the right shoulder pain, but the left  shoulder pain persists. He is interested in a cortisone injection for the right shoulder pain.  The patient also reports persistent low energy despite testosterone replacement therapy. He has only received one injection and missed a follow-up appointment in December. He is unsure of the refill process and has not picked up additional doses from the pharmacy.  The patient has been taking vitamin D weekly and is due for a refill soon. He has not had any recent gout flares and believes the gout medication is effective. He also reports that his blood pressure has been running high, despite taking losartan.  Doses adherence with his diabetes regimen but his A1c is 7.3 which has trended up from 6.8 previously. He denies presence of gout flares.       Past Medical History:  Diagnosis Date   Acute respiratory failure with hypoxia (HCC) 07/25/2021   Arthritis    osteoarthritis and gout   Diabetes mellitus without complication (HCC)    Diverticulitis    Hypertension     Past Surgical History:  Procedure Laterality Date   COLONOSCOPY     HYDROCELE EXCISION / REPAIR     NO PAST SURGERIES     PROCTOSCOPY N/A 02/22/2022   Procedure: RIGID PROCTOSCOPY;  Surgeon: Karie Soda, MD;   Location: [facility];  Service: General;  Laterality: N/A;   XI ROBOTIC ASSISTED LOWER ANTERIOR RESECTION  02/22/2022   for diverticulitis/takedown of colovesical fistula   XI ROBOTIC LAPAROSCOPIC ASSISTED APPENDECTOMY  02/22/2022    Family History  Problem Relation Age of Onset   Leukemia Mother 19   Diverticulitis Brother    Colon cancer Neg Hx    Stomach cancer Neg Hx    Esophageal cancer Neg Hx    Colon polyps Neg Hx     Social History   Socioeconomic History   Marital status: Married    Spouse name: Not on file   Number of children: 0   Years of education: Not on file   Highest education level: Not on file  Occupational History   Occupation: Holiday representative  Tobacco Use   Smoking status: Every Day    Current packs/day: 1.00    Types: Cigarettes   Smokeless tobacco: Never   Tobacco comments:    Smoking 1 ppd  Vaping Use   Vaping status: Never Used  Substance and Sexual Activity   Alcohol use: Not Currently    Alcohol/week: 6.0 standard drinks of alcohol    Types: 6 Cans of beer per week    Comment: quit drinking   Drug use: No   Sexual activity: Yes  Other Topics Concern   Not on file  Social History Narrative   Not on file   Social Drivers of Health   Financial Resource Strain: Not on file  Food Insecurity: No Food  Insecurity (02/22/2022)   Hunger Vital Sign    Worried About Running Out of Food in the Last Year: Never true    Ran Out of Food in the Last Year: Never true  Transportation Needs: No Transportation Needs (02/22/2022)   PRAPARE - Administrator, Civil Service (Medical): No    Lack of Transportation (Non-Medical): No  Physical Activity: Not on file  Stress: Not on file  Social Connections: Not on file    No Known Allergies  Outpatient Medications Prior to Visit  Medication Sig Dispense Refill   aspirin EC 81 MG tablet Take 1 tablet (81 mg total) by mouth daily. 30 tablet 5   atorvastatin (LIPITOR) 80 MG tablet Take 1 tablet (80 mg  total) by mouth daily. 90 tablet 3   empagliflozin (JARDIANCE) 25 MG TABS tablet Take 1 tablet (25 mg total) by mouth daily before breakfast. 30 tablet 0   hydrOXYzine (ATARAX) 25 MG tablet Take 1 tablet (25 mg total) by mouth at bedtime as needed for itching. 60 tablet 1   nicotine (NICODERM CQ - DOSED IN MG/24 HOURS) 21 mg/24hr patch Place 1 patch (21 mg total) onto the skin daily. 28 patch 2   permethrin (ELIMITE) 5 % cream Apply from head to toe and leave in for 8 to 14 hours and wash off.  May repeat in 72 hours. 60 g 1   allopurinol (ZYLOPRIM) 300 MG tablet Take 1 tablet (300 mg total) by mouth daily. 90 tablet 1   amLODipine (NORVASC) 10 MG tablet Take 1 tablet (10 mg total) by mouth daily. 30 tablet 0   ergocalciferol (DRISDOL) 1.25 MG (50000 UT) capsule Take 1 capsule (50,000 Units total) by mouth once a week. 12 capsule 1   losartan (COZAAR) 100 MG tablet Take 1 tablet (100 mg total) by mouth daily. 90 tablet 1   metFORMIN (GLUCOPHAGE) 1000 MG tablet Take 1 tablet (1,000 mg total) by mouth 2 (two) times daily with a meal. 180 tablet 1   testosterone cypionate (DEPOTESTOSTERONE CYPIONATE) 200 MG/ML injection Inject 1 mL (200 mg total) into the muscle every 28 (twenty-eight) days. 1 mL 5   Facility-Administered Medications Prior to Visit  Medication Dose Route Frequency Provider Last Rate Last Admin   0.9 %  sodium chloride infusion  500 mL Intravenous Once Cirigliano, Vito V, DO         ROS Review of Systems  Constitutional:  Negative for activity change and appetite change.  HENT:  Negative for sinus pressure and sore throat.   Respiratory:  Negative for chest tightness, shortness of breath and wheezing.   Cardiovascular:  Negative for chest pain and palpitations.  Gastrointestinal:  Negative for abdominal distention, abdominal pain and constipation.  Genitourinary: Negative.   Musculoskeletal:        See HPI  Psychiatric/Behavioral:  Negative for behavioral problems and  dysphoric mood.     Objective:  BP (!) 146/77   Pulse 81   Ht 6\' 3"  (1.905 m)   Wt 243 lb (110.2 kg)   SpO2 92%   BMI 30.37 kg/m      03/19/2023    4:34 PM 03/19/2023    4:03 PM 03/18/2023   11:21 AM  BP/Weight  Systolic BP 146 154 173  Diastolic BP 77 79 82  Wt. (Lbs)  243   BMI  30.37 kg/m2       Physical Exam Constitutional:      Appearance: He is well-developed.  Cardiovascular:  Rate and Rhythm: Normal rate.     Heart sounds: Normal heart sounds. No murmur heard. Pulmonary:     Effort: Pulmonary effort is normal.     Breath sounds: Normal breath sounds. No wheezing or rales.  Chest:     Chest wall: No tenderness.  Abdominal:     General: Bowel sounds are normal. There is no distension.     Palpations: Abdomen is soft. There is no mass.     Tenderness: There is no abdominal tenderness.  Musculoskeletal:     Right lower leg: No edema.     Left lower leg: No edema.     Comments: Tenderness on palpation of left shoulder joint and on range of motion of left upper extremity Right upper extremity is normal  Neurological:     Mental Status: He is alert and oriented to person, place, and time.  Psychiatric:        Mood and Affect: Mood normal.        Latest Ref Rng & Units 10/09/2022   10:00 AM 09/24/2022    2:20 PM 02/23/2022    4:40 AM  CMP  Glucose 70 - 99 mg/dL  960  454   BUN 8 - 27 mg/dL  15  20   Creatinine 0.98 - 1.27 mg/dL  1.19  1.47   Sodium 829 - 144 mmol/L  140  137   Potassium 3.5 - 5.2 mmol/L  4.6  4.1   Chloride 96 - 106 mmol/L  102  105   CO2 20 - 29 mmol/L  23  23   Calcium 8.6 - 10.2 mg/dL  9.9  8.8   Total Protein 6.0 - 8.5 g/dL 6.6  7.0    Total Bilirubin 0.0 - 1.2 mg/dL 0.3  0.2    Alkaline Phos 44 - 121 IU/L 63  76    AST 0 - 40 IU/L 10  10    ALT 0 - 44 IU/L 14  12      Lipid Panel     Component Value Date/Time   CHOL 137 10/09/2022 1000   TRIG 195 (H) 10/09/2022 1000   HDL 40 10/09/2022 1000   CHOLHDL 3.4 10/09/2022  1000   CHOLHDL 5.6 07/24/2021 1929   VLDL 32 07/24/2021 1929   LDLCALC 65 10/09/2022 1000    CBC    Component Value Date/Time   WBC 10.9 (H) 02/23/2022 0440   RBC 4.23 02/23/2022 0440   HGB 12.2 (L) 02/23/2022 0440   HCT 38.0 (L) 02/23/2022 0440   PLT 176 02/23/2022 0440   MCV 89.8 02/23/2022 0440   MCH 28.8 02/23/2022 0440   MCHC 32.1 02/23/2022 0440   RDW 16.3 (H) 02/23/2022 0440   LYMPHSABS 1.6 11/30/2021 1858   MONOABS 1.6 (H) 11/30/2021 1858   EOSABS 0.0 11/30/2021 1858   BASOSABS 0.1 11/30/2021 1858    Lab Results  Component Value Date   HGBA1C 7.3 (A) 03/19/2023    Assessment & Plan:      Bilateral Shoulder Pain/adhesive capsulitis -Left greater than right Persistent despite physical therapy.  -Refer to Orthopedics for evaluation and possible cortisone injection.  Testosterone Deficiency Patient reports persistent symptoms of low energy. Only one injection received so far due to misunderstanding of refill process. -Clarified refill process with patient. -Advise patient to pick up testosterone from pharmacy and bring to clinic for injection every 28 days.  Vitamin D Deficiency Patient is compliant with weekly Vitamin D supplementation and is nearing end of  current supply. -Refill Vitamin D prescription. -Order Vitamin D level.  Gout No recent flares reported. Medication supply running low. -Refill gout medication.  Hypertension Blood pressure slightly elevated. Patient recently smoked. -Change losartan to valsartan 160mg  daily. -Recheck blood pressure at next visit.  Type 2 Diabetes Mellitus A1c increased from 6.8 to 7.3. Patient admits to possible dietary indiscretions. -Advise patient to improve diet, specifically reduce intake of sweets. -Continue current medication (Jardiance). -Schedule follow-up visit in 3 months to monitor A1c.  General Health Maintenance -Order blood work today.          Meds ordered this encounter  Medications    allopurinol (ZYLOPRIM) 300 MG tablet    Sig: Take 1 tablet (300 mg total) by mouth daily.    Dispense:  90 tablet    Refill:  1   amLODipine (NORVASC) 10 MG tablet    Sig: Take 1 tablet (10 mg total) by mouth daily.    Dispense:  90 tablet    Refill:  1   ergocalciferol (DRISDOL) 1.25 MG (50000 UT) capsule    Sig: Take 1 capsule (50,000 Units total) by mouth once a week.    Dispense:  12 capsule    Refill:  1   metFORMIN (GLUCOPHAGE) 1000 MG tablet    Sig: Take 1 tablet (1,000 mg total) by mouth 2 (two) times daily with a meal.    Dispense:  180 tablet    Refill:  1   testosterone cypionate (DEPOTESTOSTERONE CYPIONATE) 200 MG/ML injection    Sig: Inject 1 mL (200 mg total) into the muscle every 28 (twenty-eight) days.    Dispense:  1 mL    Refill:  5    change to vial per md with 1ml   valsartan (DIOVAN) 160 MG tablet    Sig: Take 1 tablet (160 mg total) by mouth daily.    Dispense:  90 tablet    Refill:  1    Discontinue Losartan    Follow-up: Return in about 3 months (around 06/16/2023) for Chronic medical conditions.       Hoy Register, MD, FAAFP. [facility] [place], [place] [phone]   03/19/2023, 6:14 PM

## 2023-03-20 ENCOUNTER — Other Ambulatory Visit: Payer: Self-pay

## 2023-03-20 LAB — CMP14+EGFR
ALT: 22 [IU]/L (ref 0–44)
AST: 14 [IU]/L (ref 0–40)
Albumin: 4.3 g/dL (ref 3.9–4.9)
Alkaline Phosphatase: 74 [IU]/L (ref 44–121)
BUN/Creatinine Ratio: 22 (ref 10–24)
BUN: 13 mg/dL (ref 8–27)
Bilirubin Total: 0.3 mg/dL (ref 0.0–1.2)
CO2: 25 mmol/L (ref 20–29)
Calcium: 10.9 mg/dL — ABNORMAL HIGH (ref 8.6–10.2)
Chloride: 101 mmol/L (ref 96–106)
Creatinine, Ser: 0.58 mg/dL — ABNORMAL LOW (ref 0.76–1.27)
Globulin, Total: 2.8 g/dL (ref 1.5–4.5)
Glucose: 128 mg/dL — ABNORMAL HIGH (ref 70–99)
Potassium: 4.6 mmol/L (ref 3.5–5.2)
Sodium: 143 mmol/L (ref 134–144)
Total Protein: 7.1 g/dL (ref 6.0–8.5)
eGFR: 110 mL/min/{1.73_m2} (ref >59)

## 2023-03-20 LAB — VITAMIN D 25 HYDROXY (VIT D DEFICIENCY, FRACTURES): Vit D, 25-Hydroxy: 31.6 ng/mL (ref 30.0–100.0)

## 2023-03-25 ENCOUNTER — Other Ambulatory Visit: Payer: Self-pay

## 2023-03-26 ENCOUNTER — Other Ambulatory Visit: Payer: Self-pay

## 2023-03-26 ENCOUNTER — Other Ambulatory Visit (INDEPENDENT_AMBULATORY_CARE_PROVIDER_SITE_OTHER): Payer: Self-pay

## 2023-03-26 ENCOUNTER — Encounter: Payer: Self-pay | Admitting: Physician Assistant

## 2023-03-26 ENCOUNTER — Ambulatory Visit: Payer: Medicaid Other | Admitting: Physician Assistant

## 2023-03-26 ENCOUNTER — Ambulatory Visit: Payer: Medicaid Other | Attending: Family Medicine

## 2023-03-26 DIAGNOSIS — M7552 Bursitis of left shoulder: Secondary | ICD-10-CM | POA: Insufficient documentation

## 2023-03-26 DIAGNOSIS — M25511 Pain in right shoulder: Secondary | ICD-10-CM | POA: Diagnosis not present

## 2023-03-26 DIAGNOSIS — M25512 Pain in left shoulder: Secondary | ICD-10-CM | POA: Diagnosis not present

## 2023-03-26 DIAGNOSIS — E291 Testicular hypofunction: Secondary | ICD-10-CM

## 2023-03-26 MED ORDER — LIDOCAINE HCL 1 % IJ SOLN
3.0000 mL | INTRAMUSCULAR | Status: AC | PRN
  Administered 2023-03-26: 3 mL

## 2023-03-26 MED ORDER — TESTOSTERONE CYPIONATE 100 MG/ML IM SOLN
200.0000 mg | INTRAMUSCULAR | Status: DC
  Administered 2023-03-26 – 2023-04-23 (×2): 200 mg via INTRAMUSCULAR

## 2023-03-26 MED ORDER — METHYLPREDNISOLONE ACETATE 40 MG/ML IJ SUSP
40.0000 mg | INTRAMUSCULAR | Status: AC | PRN
  Administered 2023-03-26: 40 mg via INTRA_ARTICULAR

## 2023-03-26 NOTE — Progress Notes (Signed)
 Testosterone Cypionate 200 mg/ ml administered in right deltoid per protocols.  Information sheet given. Patient denies and pain or discomfort at injection site. Tolerated injection well no reaction.

## 2023-03-26 NOTE — Progress Notes (Signed)
 Office Visit Note   Patient: Donald Bender           Date of Birth: 1960/09/29           MRN: [id number] Visit Date: 03/26/2023              Requested by: Hoy Register, MD [address] [place],  [place] [address] PCP: Hoy Register, MD   Assessment & Plan: Visit Diagnoses:  1. Acute pain of both shoulders   2. Bursitis of left shoulder     Plan: Pleasant 63 year old gentleman with a several month history of left greater than right shoulder pain.  He has been followed by his primary care provider.  At some point he had quite a bit of stiffness and thought was he had some adhesive capsulitis.  He is diabetic.  Did go to physical therapy and that part his right arm is much better.  Does not think that is helped his left arm a whole lot.  Feels stiff and still sore.  His external rotation is actually pretty good and he does not have pain we will try a subacromial injection today.  He is and let me know how this helps if it does not then could consider a glenohumeral injection or an MRI  Follow-Up Instructions: No follow-ups on file.   Orders:  Orders Placed This Encounter  Procedures   XR Shoulder Right   XR Shoulder Left   No orders of the defined types were placed in this encounter.     Procedures: Large Joint Inj: L subacromial bursa on 03/26/2023 3:40 PM Indications: diagnostic evaluation and pain Details: 25 G 1.5 in needle, posterior approach  Arthrogram: No  Medications: 3 mL lidocaine 1 %; 40 mg methylPREDNISolone acetate 40 MG/ML Outcome: tolerated well, no immediate complications Procedure, treatment alternatives, risks and benefits explained, specific risks discussed. Consent was given by the patient.       Clinical Data: No additional findings.   Subjective: Chief Complaint  Patient presents with   Right Shoulder - Pain   Left Shoulder - Pain    HPI pleasant 63 year old gentleman no particular injury several month history of left  greater than right shoulder pain.  He was seen by his primary care who has been doing some physical therapy thinking he had some adhesive capsulitis he does have history of diabetes but is fairly well-controlled.  Denies any repetitive activities  Review of Systems  All other systems reviewed and are negative.    Objective: Vital Signs: There were no vitals taken for this visit.  Physical Exam Constitutional:      Appearance: Normal appearance.  Pulmonary:     Effort: Pulmonary effort is normal.  Skin:    General: Skin is warm and dry.  Neurological:     General: No focal deficit present.     Mental Status: He is alert and oriented to person, place, and time.  Psychiatric:        Mood and Affect: Mood normal.        Behavior: Behavior normal.     Ortho Exam Examination of his left shoulder he has some tightness with forward elevation positive impingement findings with empty can test negative speeds test has some discomfort with internal rotation behind his back strength is actually quite good with resisted abduction external rotation no pain reproduced on external rotation Specialty Comments:  No specialty comments available.  Imaging: XR Shoulder Left Result Date: 03/26/2023 Radiographs of the  left shoulder no acute fractures.  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   Lipoma of transverse colon 01/01/2022   Diastasis recti 01/01/2022   History of adenomatous polyp of colon 01/01/2022   History of recent steroid use 01/01/2022    Nasal mass 01/01/2022   Non-recurrent bilateral inguinal hernia without obstruction or gangrene 01/01/2022   Umbilical hernia without obstruction and without gangrene 01/01/2022   Hypokalemia 12/04/2021   Hypomagnesemia 12/04/2021   Type 2 diabetes mellitus with hyperglycemia (HCC) 12/01/2021   Hypertension 12/01/2021   Diverticulitis of colon with perforation 11/30/2021   Diverticulitis of large intestine with perforation 07/25/2021   Hypertensive urgency 07/25/2021   SIRS (systemic inflammatory response syndrome) (HCC) 07/25/2021   Leukocytosis 07/25/2021   Tobacco abuse 07/25/2021   Alcohol abuse 07/25/2021   Does not have health insurance 07/25/2021   Obesity (BMI 30-39.9) 07/25/2021   Acute respiratory failure with hypoxia (HCC) 07/25/2021   Polycythemia    Cellulitis 03/18/2020   Hypertriglyceridemia 07/06/2016   Gout 11/16/2015   Essential hypertension 09/11/2013   Type 2 diabetes mellitus without complication, without long-term current use of insulin (HCC) 09/11/2013   Past Medical History:  Diagnosis Date   Acute respiratory failure with hypoxia (HCC) 07/25/2021   Arthritis    osteoarthritis and gout   Diabetes mellitus without complication (HCC)    Diverticulitis    Hypertension     Family History  Problem Relation Age of Onset   Leukemia Mother 45   Diverticulitis Brother    Colon cancer Neg Hx    Stomach cancer Neg Hx    Esophageal cancer Neg Hx    Colon polyps Neg Hx     Past Surgical History:  Procedure Laterality Date   COLONOSCOPY     HYDROCELE EXCISION / REPAIR     NO PAST SURGERIES     PROCTOSCOPY N/A 02/22/2022   Procedure: RIGID PROCTOSCOPY;  Surgeon: Karie Soda, MD;  Location: [facility];  Service: General;  Laterality: N/A;   XI ROBOTIC ASSISTED LOWER ANTERIOR RESECTION  02/22/2022   for diverticulitis/takedown of colovesical fistula   XI ROBOTIC LAPAROSCOPIC ASSISTED APPENDECTOMY  02/22/2022   Social History   Occupational History    Occupation: Holiday representative  Tobacco Use   Smoking status: Every Day    Current packs/day: 1.00    Types: Cigarettes   Smokeless tobacco: Never   Tobacco comments:    Smoking 1 ppd  Vaping Use   Vaping status: Never Used  Substance and Sexual Activity   Alcohol use: Not Currently    Alcohol/week: 6.0 standard drinks of alcohol    Types: 6 Cans of beer per week    Comment: quit drinking   Drug use: No   Sexual activity: Yes

## 2023-03-27 ENCOUNTER — Ambulatory Visit: Payer: Medicaid Other | Admitting: Family Medicine

## 2023-04-16 ENCOUNTER — Other Ambulatory Visit: Payer: Self-pay | Admitting: Family Medicine

## 2023-04-16 ENCOUNTER — Other Ambulatory Visit: Payer: Self-pay

## 2023-04-16 DIAGNOSIS — E119 Type 2 diabetes mellitus without complications: Secondary | ICD-10-CM

## 2023-04-16 MED ORDER — EMPAGLIFLOZIN 25 MG PO TABS
25.0000 mg | ORAL_TABLET | Freq: Every day | ORAL | 0 refills | Status: DC
  Filled 2023-04-16: qty 90, 90d supply, fill #0

## 2023-04-18 ENCOUNTER — Other Ambulatory Visit: Payer: Self-pay

## 2023-04-19 ENCOUNTER — Ambulatory Visit: Payer: Medicaid Other | Admitting: Plastic Surgery

## 2023-04-19 VITALS — BP 158/69 | HR 99

## 2023-04-19 DIAGNOSIS — L989 Disorder of the skin and subcutaneous tissue, unspecified: Secondary | ICD-10-CM

## 2023-04-19 NOTE — Progress Notes (Signed)
   Subjective:    Patient ID: Donald Bender, male    DOB: January 18, 1961, 63 y.o.   MRN: [id number]  The patient is a 63 year old gentleman here for follow-up after being treated with laser and excision for rhinophyma.  The patient is thrilled about the results.  We may do a revision but we will give it some more time.  He has a lesion on the left nose that looks like a basal cell carcinoma.  I would like to go ahead and get that excised.  There is approximately a centimeter in size with ulceration in the middle and raising up of the ridges on the perimeter.   Review of Systems  Constitutional: Negative.   Eyes: Negative.   Respiratory: Negative.    Cardiovascular: Negative.   Gastrointestinal: Negative.   Endocrine: Negative.   Genitourinary: Negative.   Musculoskeletal: Negative.        Objective:   Physical Exam Constitutional:      Appearance: Normal appearance.  HENT:     Head: Atraumatic.   Cardiovascular:     Rate and Rhythm: Normal rate.     Pulses: Normal pulses.  Skin:    General: Skin is warm.     Capillary Refill: Capillary refill takes less than 2 seconds.     Coloration: Skin is not jaundiced.     Findings: Lesion present. No bruising.  Neurological:     Mental Status: He is alert and oriented to person, place, and time.  Psychiatric:        Mood and Affect: Mood normal.        Behavior: Behavior normal.        Thought Content: Thought content normal.        Judgment: Judgment normal.        Assessment & Plan:     ICD-10-CM   1. Changing skin lesion  L98.9        Plan for excision changing skin lesion left cheek.  Pictures were obtained of the patient and placed in the chart with the patient's or guardian's permission.

## 2023-04-22 ENCOUNTER — Other Ambulatory Visit: Payer: Self-pay

## 2023-04-23 ENCOUNTER — Ambulatory Visit: Payer: Medicaid Other | Attending: Family Medicine

## 2023-04-23 ENCOUNTER — Other Ambulatory Visit: Payer: Self-pay

## 2023-04-23 DIAGNOSIS — E291 Testicular hypofunction: Secondary | ICD-10-CM | POA: Diagnosis not present

## 2023-04-23 MED ORDER — TESTOSTERONE CYPIONATE 200 MG/ML IM SOLN: 200.0000 mg | Freq: Once | INTRAMUSCULAR | Status: DC

## 2023-04-23 NOTE — Progress Notes (Signed)
 Testosterone Cypionate 200 mg/ ml administered in left deltoid per protocols.  Information sheet given. Patient denies and pain or discomfort at injection site. Tolerated injection well no reaction.

## 2023-05-06 ENCOUNTER — Other Ambulatory Visit: Payer: Self-pay

## 2023-05-10 ENCOUNTER — Other Ambulatory Visit: Payer: Self-pay

## 2023-05-23 ENCOUNTER — Other Ambulatory Visit: Payer: Self-pay

## 2023-05-24 ENCOUNTER — Ambulatory Visit: Attending: Family Medicine

## 2023-05-24 DIAGNOSIS — E291 Testicular hypofunction: Secondary | ICD-10-CM | POA: Diagnosis not present

## 2023-05-24 MED ORDER — TESTOSTERONE CYPIONATE 200 MG/ML IM SOLN
200.0000 mg | Freq: Once | INTRAMUSCULAR | Status: AC
Start: 2023-05-24 — End: 2023-05-24
  Administered 2023-05-24: 200 mg via INTRAMUSCULAR

## 2023-05-24 NOTE — Progress Notes (Signed)
 Testosterone Cypionate 200 mg/ ml administered in right deltoid per protocols.  Information sheet given. Patient denies and pain or discomfort at injection site. Tolerated injection well no reaction.

## 2023-05-28 ENCOUNTER — Other Ambulatory Visit (HOSPITAL_COMMUNITY)
Admission: RE | Admit: 2023-05-28 | Discharge: 2023-05-28 | Disposition: A | Discharge status: Home / Self Care | Source: Ambulatory Visit | Attending: Plastic Surgery | Admitting: Plastic Surgery

## 2023-05-28 ENCOUNTER — Ambulatory Visit: Admitting: Plastic Surgery

## 2023-05-28 VITALS — BP 154/78 | HR 102

## 2023-05-28 DIAGNOSIS — L989 Disorder of the skin and subcutaneous tissue, unspecified: Secondary | ICD-10-CM | POA: Insufficient documentation

## 2023-05-28 DIAGNOSIS — L728 Other follicular cysts of the skin and subcutaneous tissue: Secondary | ICD-10-CM | POA: Diagnosis not present

## 2023-05-28 NOTE — Progress Notes (Signed)
 Procedure Note  Preoperative Dx: changing skin lesion of left cheek  Postoperative Dx: Same  Procedure: excision of changing skin lesion of left cheek 8 mm  Anesthesia: Lidocaine  1% with 1:100,000 epinephrine  Indication for Procedure: skin lesion  Description of Procedure: Risks and complications were explained to the patient.  Consent was confirmed and the patient understands the risks and benefits.  The potential complications and alternatives were explained and the patient consents.  The patient expressed understanding the option of not having the procedure and the risks of a scar.  Time out was called and all information was confirmed to be correct.    The area was prepped and drapped.  Lidocaine  1% with epinephrine was injected in the subcutaneous area.  After waiting several minutes for the local to take affect a #15 blade was used to excise the area in an eliptical pattern.  The skin edges were reapproximated with 6-0 Monocryl subcuticular running closure.  A dressing was applied.  The patient was given instructions on how to care for the area and a follow up appointment.  Donald Bender tolerated the procedure well and there were no complications. The specimen was sent to pathology.

## 2023-05-31 LAB — SURGICAL PATHOLOGY

## 2023-06-04 ENCOUNTER — Encounter: Payer: Self-pay | Admitting: Student

## 2023-06-04 ENCOUNTER — Ambulatory Visit (INDEPENDENT_AMBULATORY_CARE_PROVIDER_SITE_OTHER): Admitting: Student

## 2023-06-04 VITALS — BP 151/83 | HR 97

## 2023-06-04 DIAGNOSIS — L989 Disorder of the skin and subcutaneous tissue, unspecified: Secondary | ICD-10-CM

## 2023-06-04 NOTE — Progress Notes (Signed)
 Patient is a 63 year old male who recently underwent excision of changing skin lesion to his left cheek with Dr. Orin Birk on 05/28/2023.  Patient is 1 week out from his procedure and presents to the clinic today for postprocedural follow-up.  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  Today, patient reports he is doing well.  He denies any issues since his procedure last week.  He denies any issues with the procedure site itself.  We discussed the pathology.  He expressed understanding.  On exam, patient was sitting upright in no acute distress.  Steri-Strip to the procedure site was intact.  This was removed.  Incision was intact and healing well.  Monocryl suture to the incision was removed without any difficulty.  Patient tolerated well.  There were no signs of infection on exam.  Recommended that patient apply Vaseline to his incision for the next week or so.  Discussed with him after that that, he should apply a silicone-based scar cream to the incision.  We also discussed that he should avoid direct sunlight as that can worsen the scar.  Patient expressed understanding.  Patient to follow back up as needed for left cheek excision.  I did discuss the pathology with Dr. Orin Birk and she was in agreement with the plan.

## 2023-06-17 ENCOUNTER — Other Ambulatory Visit: Payer: Self-pay

## 2023-06-17 ENCOUNTER — Ambulatory Visit: Payer: Medicaid Other | Attending: Family Medicine | Admitting: Family Medicine

## 2023-06-17 VITALS — BP 137/79 | HR 92 | Ht 75.0 in | Wt 237.6 lb

## 2023-06-17 DIAGNOSIS — Z23 Encounter for immunization: Secondary | ICD-10-CM | POA: Diagnosis not present

## 2023-06-17 DIAGNOSIS — E119 Type 2 diabetes mellitus without complications: Secondary | ICD-10-CM

## 2023-06-17 DIAGNOSIS — M1A0710 Idiopathic chronic gout, right ankle and foot, without tophus (tophi): Secondary | ICD-10-CM

## 2023-06-17 DIAGNOSIS — F17210 Nicotine dependence, cigarettes, uncomplicated: Secondary | ICD-10-CM

## 2023-06-17 DIAGNOSIS — E291 Testicular hypofunction: Secondary | ICD-10-CM | POA: Diagnosis not present

## 2023-06-17 DIAGNOSIS — I10 Essential (primary) hypertension: Secondary | ICD-10-CM

## 2023-06-17 DIAGNOSIS — E1159 Type 2 diabetes mellitus with other circulatory complications: Secondary | ICD-10-CM

## 2023-06-17 DIAGNOSIS — E785 Hyperlipidemia, unspecified: Secondary | ICD-10-CM

## 2023-06-17 DIAGNOSIS — Z7984 Long term (current) use of oral hypoglycemic drugs: Secondary | ICD-10-CM | POA: Diagnosis not present

## 2023-06-17 DIAGNOSIS — E1169 Type 2 diabetes mellitus with other specified complication: Secondary | ICD-10-CM

## 2023-06-17 LAB — POCT GLYCOSYLATED HEMOGLOBIN (HGB A1C): HbA1c, POC (controlled diabetic range): 6.7 % (ref 0.0–7.0)

## 2023-06-17 MED ORDER — METFORMIN HCL 1000 MG PO TABS
1000.0000 mg | ORAL_TABLET | Freq: Two times a day (BID) | ORAL | 1 refills | Status: DC
  Filled 2023-06-17 – 2023-07-22 (×2): qty 180, 90d supply, fill #0
  Filled 2023-10-23: qty 180, 90d supply, fill #1

## 2023-06-17 MED ORDER — ATORVASTATIN CALCIUM 80 MG PO TABS
80.0000 mg | ORAL_TABLET | Freq: Every day | ORAL | 1 refills | Status: DC
Start: 2023-06-17 — End: 2023-12-18
  Filled 2023-06-17 – 2023-07-22 (×2): qty 90, 90d supply, fill #0
  Filled 2023-10-23: qty 90, 90d supply, fill #1

## 2023-06-17 MED ORDER — VALSARTAN 160 MG PO TABS
160.0000 mg | ORAL_TABLET | Freq: Every day | ORAL | 1 refills | Status: DC
Start: 2023-06-17 — End: 2023-12-18
  Filled 2023-06-17: qty 90, 90d supply, fill #0
  Filled 2023-09-27: qty 90, 90d supply, fill #1

## 2023-06-17 MED ORDER — AMLODIPINE BESYLATE 10 MG PO TABS
10.0000 mg | ORAL_TABLET | Freq: Every day | ORAL | 1 refills | Status: DC
  Filled 2023-06-17 – 2023-07-22 (×2): qty 90, 90d supply, fill #0
  Filled 2023-10-23: qty 90, 90d supply, fill #1

## 2023-06-17 MED ORDER — ALLOPURINOL 300 MG PO TABS
300.0000 mg | ORAL_TABLET | Freq: Every day | ORAL | 1 refills | Status: DC
Start: 2023-06-17 — End: 2023-12-18
  Filled 2023-06-17 – 2023-07-22 (×2): qty 90, 90d supply, fill #0
  Filled 2023-10-23: qty 90, 90d supply, fill #1

## 2023-06-17 MED ORDER — EMPAGLIFLOZIN 25 MG PO TABS
25.0000 mg | ORAL_TABLET | Freq: Every day | ORAL | 1 refills | Status: DC
  Filled 2023-06-17 – 2023-07-24 (×2): qty 90, 90d supply, fill #0
  Filled 2023-10-23: qty 90, 90d supply, fill #1

## 2023-06-17 NOTE — Patient Instructions (Signed)
 VISIT SUMMARY:  Today, we reviewed your ongoing health concerns, including diabetes management, testosterone  therapy, and general health maintenance. We discussed your recent lab results and planned further tests and referrals to ensure comprehensive care.  YOUR PLAN:  -TYPE 2 DIABETES MELLITUS: Your A1c level has improved to 6.7%, which indicates good control of your blood sugar levels. We will check your urine to assess kidney function and refer you to an eye specialist for a comprehensive eye exam.  -TESTOSTERONE  DEFICIENCY: You are continuing with testosterone  injections, but there has been minimal improvement in your energy and sexual function. We will check your testosterone  levels, blood count, and PSA levels. Depending on the results, we may consider referring you to a urologist. For now, continue with your current injections and we will reassess after we have the test results.  -GENERAL HEALTH MAINTENANCE: Given your smoking history, we will order a CT scan of your chest to screen for lung cancer. You will also receive the pneumonia vaccine and the first dose of the shingles vaccine today. The second dose of the shingles vaccine will be administered in six months.  INSTRUCTIONS:  Please schedule a urine test for kidney function and a blood test to check your testosterone  levels, blood count, and PSA levels. Also, make an appointment with an ophthalmologist for an eye exam. Continue with your current testosterone  injections and follow up with us  after your tests. Don't forget to come back in six months for the second dose of the shingles vaccine.

## 2023-06-17 NOTE — Progress Notes (Signed)
 Subjective:  Patient ID: Donald Bender, male    DOB: Oct 08, 1960  Age: 63 y.o. MRN: [id number]  CC: Medical Management of Chronic Issues (No concerns )     Discussed the use of AI scribe software for clinical note transcription with the patient, who gave verbal consent to proceed.  History of Present Illness Donald Bender is a 63 year old male with a history of type 2 diabetes mellitus hypertension, polycythemia, aortic atherosclerosis, coronary artery calcifications tobacco abuse (2-3 packs of cigarettes/day for close to 50 years), hypogonadism who presents for routine follow-up and management of testosterone  therapy.  He is undergoing testosterone  injections with minimal improvement in energy levels and no significant changes in sexual function. He is unsure about the number of refills remaining for his medication. His testosterone  levels and blood count were last checked in January.  He has diabetes with a recent A1c of 6.7, improved from 7.3 in February. He does not have an ophthalmologist and has not had a recent eye exam.  He smokes about a pack and a half of cigarettes per day and wants to quit but feels he lacks the energy to do so. He is not engaging in physical activity due to low energy levels. He is due for a CT scan of the chest for lung cancer screening due to his smoking history.  He is interested in receiving the shingles and pneumonia vaccines.    Past Medical History:  Diagnosis Date   Acute respiratory failure with hypoxia (HCC) 07/25/2021   Arthritis    osteoarthritis and gout   Diabetes mellitus without complication (HCC)    Diverticulitis    Hypertension     Past Surgical History:  Procedure Laterality Date   COLONOSCOPY     HYDROCELE EXCISION / REPAIR     NO PAST SURGERIES     PROCTOSCOPY N/A 02/22/2022   Procedure: RIGID PROCTOSCOPY;  Surgeon: Candyce Champagne, MD;  Location: [facility];  Service: General;  Laterality: N/A;   XI ROBOTIC ASSISTED LOWER  ANTERIOR RESECTION  02/22/2022   for diverticulitis/takedown of colovesical fistula   XI ROBOTIC LAPAROSCOPIC ASSISTED APPENDECTOMY  02/22/2022    Family History  Problem Relation Age of Onset   Leukemia Mother 64   Diverticulitis Brother    Colon cancer Neg Hx    Stomach cancer Neg Hx    Esophageal cancer Neg Hx    Colon polyps Neg Hx     Social History   Socioeconomic History   Marital status: Married    Spouse name: Not on file   Number of children: 0   Years of education: Not on file   Highest education level: Not on file  Occupational History   Occupation: Holiday representative  Tobacco Use   Smoking status: Every Day    Current packs/day: 1.00    Types: Cigarettes   Smokeless tobacco: Never   Tobacco comments:    Smoking 1 ppd  Vaping Use   Vaping status: Never Used  Substance and Sexual Activity   Alcohol use: Not Currently    Alcohol/week: 6.0 standard drinks of alcohol    Types: 6 Cans of beer per week    Comment: quit drinking   Drug use: No   Sexual activity: Yes  Other Topics Concern   Not on file  Social History Narrative   Not on file   Social Drivers of Health   Financial Resource Strain: Not on file  Food Insecurity: No Food Insecurity (02/22/2022)  Hunger Vital Sign    Worried About Running Out of Food in the Last Year: Never true    Ran Out of Food in the Last Year: Never true  Transportation Needs: No Transportation Needs (02/22/2022)   PRAPARE - Administrator, Civil Service (Medical): No    Lack of Transportation (Non-Medical): No  Physical Activity: Not on file  Stress: Not on file  Social Connections: Not on file    No Known Allergies  Outpatient Medications Prior to Visit  Medication Sig Dispense Refill   aspirin  EC 81 MG tablet Take 1 tablet (81 mg total) by mouth daily. 30 tablet 5   ergocalciferol  (DRISDOL ) 1.25 MG (50000 UT) capsule Take 1 capsule (50,000 Units total) by mouth once a week. 12 capsule 1   hydrOXYzine   (ATARAX ) 25 MG tablet Take 1 tablet (25 mg total) by mouth at bedtime as needed for itching. 60 tablet 1   nicotine  (NICODERM CQ  - DOSED IN MG/24 HOURS) 21 mg/24hr patch Place 1 patch (21 mg total) onto the skin daily. 28 patch 2   permethrin  (ELIMITE ) 5 % cream Apply from head to toe and leave in for 8 to 14 hours and wash off.  May repeat in 72 hours. 60 g 1   allopurinol  (ZYLOPRIM ) 300 MG tablet Take 1 tablet (300 mg total) by mouth daily. 90 tablet 1   amLODipine  (NORVASC ) 10 MG tablet Take 1 tablet (10 mg total) by mouth daily. 90 tablet 1   atorvastatin  (LIPITOR) 80 MG tablet Take 1 tablet (80 mg total) by mouth daily. 90 tablet 3   empagliflozin  (JARDIANCE ) 25 MG TABS tablet Take 1 tablet (25 mg total) by mouth daily before breakfast. 90 tablet 0   metFORMIN  (GLUCOPHAGE ) 1000 MG tablet Take 1 tablet (1,000 mg total) by mouth 2 (two) times daily with a meal. 180 tablet 1   testosterone  cypionate (DEPOTESTOSTERONE CYPIONATE) 200 MG/ML injection Inject 1 mL (200 mg total) into the muscle every 28 (twenty-eight) days. 1 mL 5   valsartan  (DIOVAN ) 160 MG tablet Take 1 tablet (160 mg total) by mouth daily. 90 tablet 1   Facility-Administered Medications Prior to Visit  Medication Dose Route Frequency Provider Last Rate Last Admin   0.9 %  sodium chloride  infusion  500 mL Intravenous Once Cirigliano, Vito V, DO       testosterone  cypionate (DEPOTESTOSTERONE CYPIONATE) injection 200 mg  200 mg Intramuscular Once Tiera Mensinger, MD       testosterone  cypionate (DEPOTESTOTERONE CYPIONATE) injection 200 mg  200 mg Intramuscular Q28 days Penelopi Mikrut, MD   200 mg at 04/23/23 1100     ROS Review of Systems  Constitutional:  Negative for activity change and appetite change.  HENT:  Negative for sinus pressure and sore throat.   Respiratory:  Negative for chest tightness, shortness of breath and wheezing.   Cardiovascular:  Negative for chest pain and palpitations.  Gastrointestinal:  Negative  for abdominal distention, abdominal pain and constipation.  Genitourinary: Negative.   Musculoskeletal: Negative.   Psychiatric/Behavioral:  Negative for behavioral problems and dysphoric mood.     Objective:  BP 137/79   Pulse 92   Ht 6\' 3"  (1.905 m)   Wt 237 lb 9.6 oz (107.8 kg)   SpO2 92%   BMI 29.70 kg/m      06/17/2023    2:42 PM 06/04/2023    9:59 AM 05/28/2023   11:48 AM  BP/Weight  Systolic BP 137 151 154  Diastolic  BP 79 83 78  Wt. (Lbs) 237.6    BMI 29.7 kg/m2        Physical Exam Constitutional:      Appearance: He is well-developed.  Cardiovascular:     Rate and Rhythm: Normal rate.     Heart sounds: Normal heart sounds. No murmur heard. Pulmonary:     Effort: Pulmonary effort is normal.     Breath sounds: Normal breath sounds. No wheezing or rales.  Chest:     Chest wall: No tenderness.  Abdominal:     General: Bowel sounds are normal. There is no distension.     Palpations: Abdomen is soft. There is no mass.     Tenderness: There is no abdominal tenderness.  Musculoskeletal:        General: Normal range of motion.     Right lower leg: No edema.     Left lower leg: No edema.  Neurological:     Mental Status: He is alert and oriented to person, place, and time.  Psychiatric:        Mood and Affect: Mood normal.        Latest Ref Rng & Units 06/17/2023    3:26 PM 03/19/2023    5:05 PM 10/09/2022   10:00 AM  CMP  Glucose 70 - 99 mg/dL 161  096    BUN 8 - 27 mg/dL 14  13    Creatinine 0.45 - 1.27 mg/dL 4.09  8.11    Sodium 914 - 144 mmol/L 142  143    Potassium 3.5 - 5.2 mmol/L 4.2  4.6    Chloride 96 - 106 mmol/L 103  101    CO2 20 - 29 mmol/L 22  25    Calcium  8.6 - 10.2 mg/dL 9.7  78.2    Total Protein 6.0 - 8.5 g/dL 6.9  7.1  6.6   Total Bilirubin 0.0 - 1.2 mg/dL 0.3  0.3  0.3   Alkaline Phos 44 - 121 IU/L 74  74  63   AST 0 - 40 IU/L 14  14  10    ALT 0 - 44 IU/L 20  22  14      Lipid Panel     Component Value Date/Time   CHOL 137  10/09/2022 1000   TRIG 195 (H) 10/09/2022 1000   HDL 40 10/09/2022 1000   CHOLHDL 3.4 10/09/2022 1000   CHOLHDL 5.6 07/24/2021 1929   VLDL 32 07/24/2021 1929   LDLCALC 65 10/09/2022 1000    CBC    Component Value Date/Time   WBC 10.9 (H) 02/23/2022 0440   RBC 4.23 02/23/2022 0440   HGB 12.2 (L) 02/23/2022 0440   HCT 38.0 (L) 02/23/2022 0440   PLT 176 02/23/2022 0440   MCV 89.8 02/23/2022 0440   MCH 28.8 02/23/2022 0440   MCHC 32.1 02/23/2022 0440   RDW 16.3 (H) 02/23/2022 0440   LYMPHSABS 1.6 11/30/2021 1858   MONOABS 1.6 (H) 11/30/2021 1858   EOSABS 0.0 11/30/2021 1858   BASOSABS 0.1 11/30/2021 1858    Lab Results  Component Value Date   HGBA1C 6.7 06/17/2023   Lab Results  Component Value Date   TSH 2.940 09/24/2022       1. Type 2 diabetes mellitus without complication, without long-term current use of insulin  (HCC) (Primary) Controlled with A1c of 6.7 Continue current regimen Counseled on Diabetic diet, my plate method, 956 minutes of moderate intensity exercise/week Blood sugar logs with fasting goals of 80-120 mg/dl, random  of less than 180 and in the event of sugars less than 60 mg/dl or greater than 528 mg/dl encouraged to notify the clinic. Advised on the need for annual eye exams, annual foot exams, Pneumonia vaccine. - POCT glycosylated hemoglobin (Hb A1C) - Microalbumin/Creatinine Ratio, Urine - Ambulatory referral to Ophthalmology - CMP14+EGFR - empagliflozin  (JARDIANCE ) 25 MG TABS tablet; Take 1 tablet (25 mg total) by mouth daily before breakfast.  Dispense: 90 tablet; Refill: 1 - metFORMIN  (GLUCOPHAGE ) 1000 MG tablet; Take 1 tablet (1,000 mg total) by mouth 2 (two) times daily with a meal.  Dispense: 180 tablet; Refill: 1  2. Idiopathic chronic gout of right foot without tophus Stable - allopurinol  (ZYLOPRIM ) 300 MG tablet; Take 1 tablet (300 mg total) by mouth daily.  Dispense: 90 tablet; Refill: 1  3. Smoking greater than 20 pack years Spent 3  minutes counseling on cessation but he is not ready to quit - CT CHEST LUNG CANCER SCREENING LOW DOSE WO CONTRAST; Future - valsartan  (DIOVAN ) 160 MG tablet; Take 1 tablet (160 mg total) by mouth daily.  Dispense: 90 tablet; Refill: 1  4. Hypertension associated with diabetes (HCC) Controlled Continue current regimen Counseled on blood pressure goal of less than 130/80, low-sodium, DASH diet, medication compliance, 150 minutes of moderate intensity exercise per week. Discussed medication compliance, adverse effects. - amLODipine  (NORVASC ) 10 MG tablet; Take 1 tablet (10 mg total) by mouth daily.  Dispense: 90 tablet; Refill: 1  5. Hypogonadism in male Uncontrolled Will check testosterone  level and adjust regimen accordingly Offered to refer to urology but he declined - Testosterone ,Free and Total  6. Hyperlipidemia associated with type 2 diabetes mellitus (HCC) Controlled Low-cholesterol diet - atorvastatin  (LIPITOR) 80 MG tablet; Take 1 tablet (80 mg total) by mouth daily.  Dispense: 90 tablet; Refill: 1      Meds ordered this encounter  Medications   allopurinol  (ZYLOPRIM ) 300 MG tablet    Sig: Take 1 tablet (300 mg total) by mouth daily.    Dispense:  90 tablet    Refill:  1   amLODipine  (NORVASC ) 10 MG tablet    Sig: Take 1 tablet (10 mg total) by mouth daily.    Dispense:  90 tablet    Refill:  1   atorvastatin  (LIPITOR) 80 MG tablet    Sig: Take 1 tablet (80 mg total) by mouth daily.    Dispense:  90 tablet    Refill:  1   empagliflozin  (JARDIANCE ) 25 MG TABS tablet    Sig: Take 1 tablet (25 mg total) by mouth daily before breakfast.    Dispense:  90 tablet    Refill:  1   metFORMIN  (GLUCOPHAGE ) 1000 MG tablet    Sig: Take 1 tablet (1,000 mg total) by mouth 2 (two) times daily with a meal.    Dispense:  180 tablet    Refill:  1   valsartan  (DIOVAN ) 160 MG tablet    Sig: Take 1 tablet (160 mg total) by mouth daily.    Dispense:  90 tablet    Refill:  1     Follow-up: Return in about 6 months (around 12/18/2023) for Chronic medical conditions, 2nd dose shingrix .       Jyden Kromer, MD, FAAFP. [facility] [place], [place] [phone]   06/18/2023, 6:04 PM

## 2023-06-18 ENCOUNTER — Other Ambulatory Visit: Payer: Self-pay

## 2023-06-18 ENCOUNTER — Ambulatory Visit: Payer: Self-pay | Admitting: Family Medicine

## 2023-06-18 ENCOUNTER — Encounter: Payer: Self-pay | Admitting: Family Medicine

## 2023-06-18 MED ORDER — TESTOSTERONE CYPIONATE 200 MG/ML IM SOLN
200.0000 mg | INTRAMUSCULAR | 5 refills | Status: AC
  Filled 2023-06-18: qty 2, 28d supply, fill #0
  Filled 2023-08-08 (×2): qty 2, 28d supply, fill #1
  Filled 2023-09-06: qty 2, 28d supply, fill #2
  Filled 2023-10-07: qty 2, 28d supply, fill #3
  Filled 2023-11-04: qty 2, 28d supply, fill #4

## 2023-06-19 LAB — MICROALBUMIN / CREATININE URINE RATIO
Creatinine, Urine: 281.2 mg/dL
Microalb/Creat Ratio: 25 mg/g{creat} (ref 0–29)
Microalbumin, Urine: 70.3 ug/mL

## 2023-06-19 LAB — TESTOSTERONE,FREE AND TOTAL
Testosterone, Free: 2.8 pg/mL — ABNORMAL LOW (ref 6.6–18.1)
Testosterone: 133 ng/dL — ABNORMAL LOW (ref 264–916)

## 2023-06-19 LAB — CMP14+EGFR
ALT: 20 IU/L (ref 0–44)
AST: 14 IU/L (ref 0–40)
Albumin: 4.3 g/dL (ref 3.9–4.9)
Alkaline Phosphatase: 74 IU/L (ref 44–121)
BUN/Creatinine Ratio: 20 (ref 10–24)
BUN: 14 mg/dL (ref 8–27)
Bilirubin Total: 0.3 mg/dL (ref 0.0–1.2)
CO2: 22 mmol/L (ref 20–29)
Calcium: 9.7 mg/dL (ref 8.6–10.2)
Chloride: 103 mmol/L (ref 96–106)
Creatinine, Ser: 0.69 mg/dL — ABNORMAL LOW (ref 0.76–1.27)
Globulin, Total: 2.6 g/dL (ref 1.5–4.5)
Glucose: 106 mg/dL — ABNORMAL HIGH (ref 70–99)
Potassium: 4.2 mmol/L (ref 3.5–5.2)
Sodium: 142 mmol/L (ref 134–144)
Total Protein: 6.9 g/dL (ref 6.0–8.5)
eGFR: 104 mL/min/{1.73_m2} (ref >59)

## 2023-06-25 ENCOUNTER — Ambulatory Visit: Attending: Family Medicine

## 2023-06-25 ENCOUNTER — Other Ambulatory Visit: Payer: Self-pay

## 2023-06-25 DIAGNOSIS — E291 Testicular hypofunction: Secondary | ICD-10-CM

## 2023-06-25 MED ORDER — TESTOSTERONE CYPIONATE 200 MG/ML IM SOLN
200.0000 mg | INTRAMUSCULAR | Status: AC
  Administered 2023-06-25 – 2023-11-04 (×7): 200 mg via INTRAMUSCULAR

## 2023-06-25 NOTE — Progress Notes (Signed)
 Patient in office today to  receive testosterone  cypionate (DEPOTESTOSTERONE CYPIONATE) injection 200 mg IM.

## 2023-07-09 ENCOUNTER — Ambulatory Visit

## 2023-07-09 NOTE — Progress Notes (Deleted)
 Patient in office today to  receive testosterone  cypionate (DEPOTESTOSTERONE CYPIONATE) injection 200 mg IM.

## 2023-07-10 NOTE — Progress Notes (Signed)
 error

## 2023-07-22 ENCOUNTER — Other Ambulatory Visit: Payer: Self-pay | Admitting: Family Medicine

## 2023-07-22 ENCOUNTER — Other Ambulatory Visit: Payer: Self-pay

## 2023-07-22 DIAGNOSIS — E119 Type 2 diabetes mellitus without complications: Secondary | ICD-10-CM

## 2023-07-24 ENCOUNTER — Other Ambulatory Visit: Payer: Self-pay

## 2023-07-26 ENCOUNTER — Ambulatory Visit: Attending: Family Medicine

## 2023-07-26 DIAGNOSIS — E291 Testicular hypofunction: Secondary | ICD-10-CM

## 2023-08-08 ENCOUNTER — Other Ambulatory Visit: Payer: Self-pay

## 2023-08-09 ENCOUNTER — Other Ambulatory Visit: Payer: Self-pay

## 2023-08-09 ENCOUNTER — Ambulatory Visit: Attending: Nurse Practitioner

## 2023-08-09 DIAGNOSIS — E291 Testicular hypofunction: Secondary | ICD-10-CM | POA: Diagnosis not present

## 2023-08-09 NOTE — Progress Notes (Signed)
 Patient in office today to  receive testosterone  cypionate (DEPOTESTOSTERONE CYPIONATE) injection 200 mg IM.

## 2023-08-19 ENCOUNTER — Ambulatory Visit

## 2023-08-23 ENCOUNTER — Ambulatory Visit: Attending: Family Medicine

## 2023-08-23 DIAGNOSIS — E291 Testicular hypofunction: Secondary | ICD-10-CM | POA: Diagnosis not present

## 2023-08-23 DIAGNOSIS — Z23 Encounter for immunization: Secondary | ICD-10-CM

## 2023-08-23 NOTE — Progress Notes (Signed)
 Patient in office today to  receive testosterone  cypionate (DEPOTESTOSTERONE CYPIONATE) injection 200 mg IM.    Shingrix  vaccine administered per protocols.  Information sheet given. Patient denies and pain or discomfort at injection site. Tolerated injection well no reaction.

## 2023-09-02 ENCOUNTER — Ambulatory Visit

## 2023-09-03 ENCOUNTER — Ambulatory Visit (INDEPENDENT_AMBULATORY_CARE_PROVIDER_SITE_OTHER): Payer: Self-pay | Admitting: Plastic Surgery

## 2023-09-03 ENCOUNTER — Encounter: Payer: Self-pay | Admitting: Plastic Surgery

## 2023-09-03 VITALS — BP 170/80 | HR 111 | Ht 75.0 in | Wt 242.6 lb

## 2023-09-03 DIAGNOSIS — L989 Disorder of the skin and subcutaneous tissue, unspecified: Secondary | ICD-10-CM | POA: Diagnosis not present

## 2023-09-03 NOTE — Progress Notes (Signed)
   Subjective:    Patient ID: Donald Bender, male    DOB: 05/11/1960, 63 y.o.   MRN: [id number]  The patient is a 63 year old gentleman here for reevaluation of his cheek.  He had a lesion excised from the left cheek in May.  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  The patient was doing well until about a week ago when he noticed that the area was getting really irritated again.  He was concerned that it might be coming back.  It is red and irritated today.      Review of Systems  Constitutional: Negative.   Eyes: Negative.   Respiratory: Negative.    Cardiovascular: Negative.   Gastrointestinal: Negative.   Endocrine: Negative.   Genitourinary: Negative.        Objective:   Physical Exam Cardiovascular:     Rate and Rhythm: Normal rate.     Pulses: Normal pulses.  Musculoskeletal:        General: Swelling and tenderness present.  Skin:    General: Skin is warm.     Capillary Refill: Capillary refill takes less than 2 seconds.     Coloration: Skin is not jaundiced.     Findings: Lesion present. No bruising.  Neurological:     Mental Status: He is oriented to person, place, and time.  Psychiatric:        Mood and Affect: Mood normal.        Behavior: Behavior normal.        Thought Content: Thought content normal.        Judgment: Judgment normal.         Assessment & Plan:     ICD-10-CM   1. Changing skin lesion  L98.9        I would like him to wash it with Vashe twice a day for the next week.  Then I want to talk with him on the phone and if it is not getting better then we do need to go ahead and excise it again.  Pictures were obtained of the patient and placed in the chart with the patient's or guardian's permission.

## 2023-09-06 ENCOUNTER — Other Ambulatory Visit: Payer: Self-pay

## 2023-09-09 ENCOUNTER — Ambulatory Visit: Attending: Family Medicine

## 2023-09-09 ENCOUNTER — Other Ambulatory Visit: Payer: Self-pay

## 2023-09-09 DIAGNOSIS — E291 Testicular hypofunction: Secondary | ICD-10-CM

## 2023-09-09 NOTE — Progress Notes (Signed)
 Patient in office today to  receive testosterone  cypionate (DEPOTESTOSTERONE CYPIONATE) injection 200 mg IM.

## 2023-09-17 ENCOUNTER — Ambulatory Visit: Payer: Self-pay | Admitting: Plastic Surgery

## 2023-09-17 DIAGNOSIS — L989 Disorder of the skin and subcutaneous tissue, unspecified: Secondary | ICD-10-CM

## 2023-09-17 NOTE — Progress Notes (Signed)
   Subjective:    Patient ID: Donald Bender, male    DOB: 1960-03-25, 63 y.o.   MRN: [id number]  The patient is a 63 year old male joining me by phone.  He is in [place]  and I am at the office.  He had a hamartoma excised from his left cheek.  It is flared up a few times.  I am concerned that there may be residual tumor there.  It is probably a good idea to go ahead and get that reexcised while it is not acting up at the moment.      Review of Systems  Constitutional: Negative.   Eyes: Negative.   Respiratory: Negative.    Cardiovascular: Negative.   Gastrointestinal: Negative.   Genitourinary: Negative.   Musculoskeletal: Negative.        Objective:   Physical Exam        Assessment & Plan:     ICD-10-CM   1. Changing skin lesion  L98.9      I connected with  Donald Bender on 09/17/23 by phone and verified that I am speaking with the correct person using two identifiers. We spent 5 minutes in discussion. I discussed the limitations of evaluation and management by telemedicine. The patient expressed understanding and agreed to proceed.   Plan for reexcision of hamartoma left cheek along with TRL to the nose.

## 2023-09-18 ENCOUNTER — Other Ambulatory Visit (HOSPITAL_COMMUNITY): Payer: Self-pay

## 2023-09-18 MED ORDER — LIDOCAINE 23% - TETRACAINE 7% TOPICAL OINTMENT (PLASTICIZED)
1.0000 | TOPICAL_OINTMENT | Freq: Once | CUTANEOUS | 0 refills | Status: AC
  Filled 2023-09-18 – 2023-10-02 (×3): qty 60, 1d supply, fill #0

## 2023-09-18 NOTE — Addendum Note (Signed)
 Addended by: LOWERY ESTEFANA RAMAN on: 09/18/2023 04:21 PM   Modules accepted: Orders

## 2023-09-19 ENCOUNTER — Other Ambulatory Visit (HOSPITAL_COMMUNITY): Payer: Self-pay

## 2023-09-23 ENCOUNTER — Ambulatory Visit: Payer: Self-pay | Attending: Family Medicine

## 2023-09-23 ENCOUNTER — Other Ambulatory Visit (HOSPITAL_COMMUNITY): Payer: Self-pay

## 2023-09-27 ENCOUNTER — Other Ambulatory Visit: Payer: Self-pay

## 2023-09-27 ENCOUNTER — Other Ambulatory Visit (HOSPITAL_COMMUNITY): Payer: Self-pay

## 2023-10-02 ENCOUNTER — Other Ambulatory Visit: Payer: Self-pay

## 2023-10-02 ENCOUNTER — Other Ambulatory Visit (HOSPITAL_COMMUNITY): Payer: Self-pay

## 2023-10-04 ENCOUNTER — Other Ambulatory Visit: Payer: Self-pay

## 2023-10-07 ENCOUNTER — Other Ambulatory Visit: Payer: Self-pay

## 2023-10-07 ENCOUNTER — Ambulatory Visit: Attending: Family Medicine

## 2023-10-07 DIAGNOSIS — E291 Testicular hypofunction: Secondary | ICD-10-CM | POA: Diagnosis not present

## 2023-10-07 MED ORDER — TESTOSTERONE CYPIONATE 200 MG/ML IM SOLN
200.0000 mg | Freq: Once | INTRAMUSCULAR | Status: AC
  Administered 2023-10-07: 200 mg via INTRAMUSCULAR

## 2023-10-07 NOTE — Progress Notes (Signed)
 testosterone  cypionate (DEPOTESTOSTERONE CYPIONATE) injection 200 mg  given with not difficulty

## 2023-10-21 ENCOUNTER — Ambulatory Visit: Payer: Self-pay | Attending: Family Medicine

## 2023-10-21 DIAGNOSIS — E291 Testicular hypofunction: Secondary | ICD-10-CM | POA: Diagnosis not present

## 2023-10-21 NOTE — Progress Notes (Signed)
 Patient tolerated shot well Given in ventrogluteal-Right Patient supplied medication

## 2023-10-29 ENCOUNTER — Other Ambulatory Visit: Payer: Self-pay

## 2023-11-01 ENCOUNTER — Other Ambulatory Visit: Payer: Self-pay

## 2023-11-04 ENCOUNTER — Other Ambulatory Visit: Payer: Self-pay

## 2023-11-04 ENCOUNTER — Ambulatory Visit: Attending: Family Medicine

## 2023-11-04 DIAGNOSIS — E291 Testicular hypofunction: Secondary | ICD-10-CM | POA: Diagnosis not present

## 2023-11-04 MED ORDER — TESTOSTERONE CYPIONATE 200 MG/ML IM SOLN: 200.0000 mg | Freq: Once | INTRAMUSCULAR | Status: AC

## 2023-11-04 NOTE — Progress Notes (Signed)
 testosterone  cypionate (DEPOTESTOSTERONE CYPIONATE) injection 200 mg  given with no difficulty

## 2023-11-08 ENCOUNTER — Ambulatory Visit (INDEPENDENT_AMBULATORY_CARE_PROVIDER_SITE_OTHER): Payer: Self-pay | Admitting: Plastic Surgery

## 2023-11-08 DIAGNOSIS — Z719 Counseling, unspecified: Secondary | ICD-10-CM

## 2023-11-08 NOTE — Progress Notes (Signed)
 Preoperative Dx: rhinophyma  Postoperative Dx:  same  Procedure: laser to nose   Anesthesia: none  Description of Procedure:  Risks and complications were explained to the patient. Consent was confirmed and signed. Eye protection was placed. Time out was called and all information was confirmed to be correct. The area  area was prepped with alcohol and wiped dry. The TRL laser was set at 40. The nose tip and end were lasered. The patient tolerated the procedure well and there were no complications. The patient is to follow up in 4 weeks.

## 2023-11-18 ENCOUNTER — Ambulatory Visit: Attending: Family Medicine

## 2023-11-18 DIAGNOSIS — E291 Testicular hypofunction: Secondary | ICD-10-CM

## 2023-11-18 MED ORDER — TESTOSTERONE CYPIONATE 200 MG/ML IM SOLN
200.0000 mg | Freq: Once | INTRAMUSCULAR | Status: AC
  Administered 2023-11-18: 200 mg via INTRAMUSCULAR

## 2023-11-18 NOTE — Progress Notes (Signed)
 Patient in office today to  receive testosterone  cypionate (DEPOTESTOSTERONE CYPIONATE) injection 200 mg IM.

## 2023-11-29 DIAGNOSIS — Z1211 Encounter for screening for malignant neoplasm of colon: Secondary | ICD-10-CM | POA: Diagnosis not present

## 2023-12-02 ENCOUNTER — Ambulatory Visit: Admitting: Plastic Surgery

## 2023-12-02 DIAGNOSIS — Z719 Counseling, unspecified: Secondary | ICD-10-CM

## 2023-12-03 NOTE — Progress Notes (Signed)
 Preoperative Dx: rhinophyma  Postoperative Dx:  same  Procedure: laser to nose   Anesthesia: none  Description of Procedure:  Risks and complications were explained to the patient. Consent was confirmed and signed. Eye protection was placed. Time out was called and all information was confirmed to be correct. The area  area was prepped with alcohol and wiped dry. The TRL laser was set at 50-60. The nose was lasered. The patient tolerated the procedure well and there were no complications. The patient is to follow up in 4 weeks.

## 2023-12-04 DIAGNOSIS — E119 Type 2 diabetes mellitus without complications: Secondary | ICD-10-CM | POA: Diagnosis not present

## 2023-12-04 DIAGNOSIS — H25813 Combined forms of age-related cataract, bilateral: Secondary | ICD-10-CM | POA: Diagnosis not present

## 2023-12-06 LAB — COLOGUARD: COLOGUARD: NEGATIVE

## 2023-12-06 NOTE — Progress Notes (Deleted)
 Cardiology Clinic Note   Patient Name: Donald Bender Date of Encounter: 12/06/2023  Primary Care Provider:  Delbert Clam, MD Primary Cardiologist:  Lurena MARLA Red, MD  Patient Profile    ***  Past Medical History    Past Medical History:  Diagnosis Date   Acute respiratory failure with hypoxia (HCC) 07/25/2021   Arthritis    osteoarthritis and gout   Diabetes mellitus without complication (HCC)    Diverticulitis    Hypertension    Past Surgical History:  Procedure Laterality Date   COLONOSCOPY     HYDROCELE EXCISION / REPAIR     NO PAST SURGERIES     PROCTOSCOPY N/A 02/22/2022   Procedure: RIGID PROCTOSCOPY;  Surgeon: Sheldon Standing, MD;  Location: [facility];  Service: General;  Laterality: N/A;   XI ROBOTIC ASSISTED LOWER ANTERIOR RESECTION  02/22/2022   for diverticulitis/takedown of colovesical fistula   XI ROBOTIC LAPAROSCOPIC ASSISTED APPENDECTOMY  02/22/2022    Allergies  No Known Allergies  History of Present Illness    ***  Home Medications    Prior to Admission medications   Medication Sig Start Date End Date Taking? Authorizing Provider  allopurinol  (ZYLOPRIM ) 300 MG tablet Take 1 tablet (300 mg total) by mouth daily. 06/17/23   Newlin, Enobong, MD  amLODipine  (NORVASC ) 10 MG tablet Take 1 tablet (10 mg total) by mouth daily. 06/17/23   Newlin, Enobong, MD  aspirin  EC 81 MG tablet Take 1 tablet (81 mg total) by mouth daily. 01/24/17   Danton Jon HERO, PA-C  atorvastatin  (LIPITOR) 80 MG tablet Take 1 tablet (80 mg total) by mouth daily. 06/17/23   Newlin, Enobong, MD  empagliflozin  (JARDIANCE ) 25 MG TABS tablet Take 1 tablet (25 mg total) by mouth daily before breakfast. 06/17/23   Delbert Clam, MD  ergocalciferol  (DRISDOL ) 1.25 MG (50000 UT) capsule Take 1 capsule (50,000 Units total) by mouth once a week. 03/19/23   Newlin, Enobong, MD  metFORMIN  (GLUCOPHAGE ) 1000 MG tablet Take 1 tablet (1,000 mg total) by mouth 2 (two) times daily with a meal.  06/17/23   Delbert Clam, MD  testosterone  cypionate (DEPOTESTOSTERONE CYPIONATE) 200 MG/ML injection Inject 1 mL (200 mg total) into the muscle every 14 (fourteen) days. 06/18/23   Newlin, Enobong, MD  valsartan  (DIOVAN ) 160 MG tablet Take 1 tablet (160 mg total) by mouth daily. 06/17/23   Delbert Clam, MD    Family History    Family History  Problem Relation Age of Onset   Leukemia Mother 17   Diverticulitis Brother    Colon cancer Neg Hx    Stomach cancer Neg Hx    Esophageal cancer Neg Hx    Colon polyps Neg Hx    He indicated that his mother is deceased. He indicated that his father is deceased. He indicated that the status of his brother is unknown. He indicated that the status of his neg hx is unknown.  Social History    Social History   Socioeconomic History   Marital status: Married    Spouse name: Not on file   Number of children: 0   Years of education: Not on file   Highest education level: Not on file  Occupational History   Occupation: Holiday Representative  Tobacco Use   Smoking status: Every Day    Current packs/day: 1.00    Types: Cigarettes   Smokeless tobacco: Never   Tobacco comments:    Smoking 1 ppd  Vaping Use   Vaping status:  Never Used  Substance and Sexual Activity   Alcohol use: Not Currently    Alcohol/week: 6.0 standard drinks of alcohol    Types: 6 Cans of beer per week    Comment: quit drinking   Drug use: No   Sexual activity: Yes  Other Topics Concern   Not on file  Social History Narrative   Not on file   Social Drivers of Health   Financial Resource Strain: Not on file  Food Insecurity: No Food Insecurity (02/22/2022)   Hunger Vital Sign    Worried About Running Out of Food in the Last Year: Never true    Ran Out of Food in the Last Year: Never true  Transportation Needs: No Transportation Needs (02/22/2022)   PRAPARE - Administrator, Civil Service (Medical): No    Lack of Transportation (Non-Medical): No  Physical  Activity: Not on file  Stress: Not on file  Social Connections: Not on file  Intimate Partner Violence: Not At Risk (02/22/2022)   Humiliation, Afraid, Rape, and Kick questionnaire    Fear of Current or Ex-Partner: No    Emotionally Abused: No    Physically Abused: No    Sexually Abused: No     Review of Systems    General:  No chills, fever, night sweats or weight changes.  Cardiovascular:  No chest pain, dyspnea on exertion, edema, orthopnea, palpitations, paroxysmal nocturnal dyspnea. Dermatological: No rash, lesions/masses Respiratory: No cough, dyspnea Urologic: No hematuria, dysuria Abdominal:   No nausea, vomiting, diarrhea, bright red blood per rectum, melena, or hematemesis Neurologic:  No visual changes, wkns, changes in mental status. All other systems reviewed and are otherwise negative except as noted above.  Physical Exam    VS:  There were no vitals taken for this visit. , BMI There is no height or weight on file to calculate BMI. GEN: Well nourished, well developed, in no acute distress. HEENT: normal. Neck: Supple, no JVD, carotid bruits, or masses. Cardiac: RRR, no murmurs, rubs, or gallops. No clubbing, cyanosis, edema.  Radials/DP/PT 2+ and equal bilaterally.  Respiratory:  Respirations regular and unlabored, clear to auscultation bilaterally. GI: Soft, nontender, nondistended, BS + x 4. MS: no deformity or atrophy. Skin: warm and dry, no rash. Neuro:  Strength and sensation are intact. Psych: Normal affect.  Accessory Clinical Findings    Recent Labs: 06/17/2023: ALT 20; BUN 14; Creatinine, Ser 0.69; Potassium 4.2; Sodium 142   Recent Lipid Panel    Component Value Date/Time   CHOL 137 10/09/2022 1000   TRIG 195 (H) 10/09/2022 1000   HDL 40 10/09/2022 1000   CHOLHDL 3.4 10/09/2022 1000   CHOLHDL 5.6 07/24/2021 1929   VLDL 32 07/24/2021 1929   LDLCALC 65 10/09/2022 1000    No BP recorded.  {Refresh Note OR Click here to enter BP  :1}***    ECG  personally reviewed by me today- ***          Assessment & Plan   1.  ***   Josefa HERO. Elta Angell NP-C     12/06/2023, 7:33 AM [facility] [address] [place], [place] [address] Office [phone]    Notice: This dictation was prepared with Dragon dictation along with smaller phrase technology. Any transcriptional errors that result from this process are unintentional and may not be corrected upon review.   I spent***minutes examining this patient, reviewing medications, and using patient centered shared decision making involving their cardiac care.  I spent  20 minutes reviewing past medical history,  medications, and prior cardiac tests.

## 2023-12-08 NOTE — Progress Notes (Unsigned)
 Cardiology Clinic Note   Patient Name: Donald Bender Date of Encounter: 12/08/2023  Primary Care Provider:  Delbert Clam, MD Primary Cardiologist:  Arun K Thukkani, MD  Patient Profile    Donald Bender 63 year old male presents to the clinic today for follow-up evaluation of his coronary artery disease, aortic atherosclerosis, hypertension and hyperlipidemia.  Past Medical History    Past Medical History:  Diagnosis Date   Acute respiratory failure with hypoxia (HCC) 07/25/2021   Arthritis    osteoarthritis and gout   Diabetes mellitus without complication (HCC)    Diverticulitis    Hypertension    Past Surgical History:  Procedure Laterality Date   COLONOSCOPY     HYDROCELE EXCISION / REPAIR     NO PAST SURGERIES     PROCTOSCOPY N/A 02/22/2022   Procedure: RIGID PROCTOSCOPY;  Surgeon: Sheldon Standing, MD;  Location: [facility];  Service: General;  Laterality: N/A;   XI ROBOTIC ASSISTED LOWER ANTERIOR RESECTION  02/22/2022   for diverticulitis/takedown of colovesical fistula   XI ROBOTIC LAPAROSCOPIC ASSISTED APPENDECTOMY  02/22/2022    Allergies  No Known Allergies  History of Present Illness    Donald Bender has a PMH of coronary artery disease, aortic atherosclerosis, type 2 diabetes, HTN, HLD, and has emphysema.  He underwent CT scan 3/24.  Incidental finding showed coronary calcification.  He has smoked for over 50 years.  He had been seen by Dr.Thukkani 12/23.  Evaluation was prior to surgery for diverticulitis.  He reported no cardiac issues at that time.  He was able to complete greater than 4 METS of physical activity.  No stress testing was ordered.  He was seen in follow-up by Dr.Varanasi on 05/28/2022.  During that time he continued to be stable from a cardiac standpoint.  He denied chest pain, dizziness, lower extremity swelling and nitroglycerin use.  He also denied shortness of breath and syncope.  He presents to the clinic today for follow-up  evaluation and states***.  *** denies chest pain, shortness of breath, lower extremity edema, fatigue, palpitations, melena, hematuria, hemoptysis, diaphoresis, weakness, presyncope, syncope, orthopnea, and PND.  Coronary artery disease, hyperlipidemia-no chest pain today.  Goal LDL less than 70.  He was noted to have coronary calcifications on CT scan for emphysema 3/24. Heart healthy low-sodium diet Continue atorvastatin , aspirin , amlodipine  Increase physical activity as tolerated  Essential hypertension-BP today***. Maintain blood pressure log Continue amlodipine , valsartan  Aortic atherosclerosis-noted on chest CT 04/13/2022.  Prediabetes-glucose***. Carb modified diet Continue metformin , Jardiance   Disposition: Follow-up with Dr.Thukkani or me in 9-12 months.  Home Medications    Prior to Admission medications   Medication Sig Start Date End Date Taking? Authorizing Provider  allopurinol  (ZYLOPRIM ) 300 MG tablet Take 1 tablet (300 mg total) by mouth daily. 06/17/23   Newlin, Enobong, MD  amLODipine  (NORVASC ) 10 MG tablet Take 1 tablet (10 mg total) by mouth daily. 06/17/23   Newlin, Enobong, MD  aspirin  EC 81 MG tablet Take 1 tablet (81 mg total) by mouth daily. 01/24/17   Danton Jon HERO, PA-C  atorvastatin  (LIPITOR) 80 MG tablet Take 1 tablet (80 mg total) by mouth daily. 06/17/23   Newlin, Enobong, MD  empagliflozin  (JARDIANCE ) 25 MG TABS tablet Take 1 tablet (25 mg total) by mouth daily before breakfast. 06/17/23   Newlin, Enobong, MD  ergocalciferol  (DRISDOL ) 1.25 MG (50000 UT) capsule Take 1 capsule (50,000 Units total) by mouth once a week. 03/19/23   Newlin, Enobong, MD  metFORMIN  (  GLUCOPHAGE ) 1000 MG tablet Take 1 tablet (1,000 mg total) by mouth 2 (two) times daily with a meal. 06/17/23   Delbert Clam, MD  testosterone  cypionate (DEPOTESTOSTERONE CYPIONATE) 200 MG/ML injection Inject 1 mL (200 mg total) into the muscle every 14 (fourteen) days. 06/18/23   Newlin, Enobong, MD   valsartan  (DIOVAN ) 160 MG tablet Take 1 tablet (160 mg total) by mouth daily. 06/17/23   Delbert Clam, MD    Family History    Family History  Problem Relation Age of Onset   Leukemia Mother 28   Diverticulitis Brother    Colon cancer Neg Hx    Stomach cancer Neg Hx    Esophageal cancer Neg Hx    Colon polyps Neg Hx    He indicated that his mother is deceased. He indicated that his father is deceased. He indicated that the status of his brother is unknown. He indicated that the status of his neg hx is unknown.  Social History    Social History   Socioeconomic History   Marital status: Married    Spouse name: Not on file   Number of children: 0   Years of education: Not on file   Highest education level: Not on file  Occupational History   Occupation: Holiday Representative  Tobacco Use   Smoking status: Every Day    Current packs/day: 1.00    Types: Cigarettes   Smokeless tobacco: Never   Tobacco comments:    Smoking 1 ppd  Vaping Use   Vaping status: Never Used  Substance and Sexual Activity   Alcohol use: Not Currently    Alcohol/week: 6.0 standard drinks of alcohol    Types: 6 Cans of beer per week    Comment: quit drinking   Drug use: No   Sexual activity: Yes  Other Topics Concern   Not on file  Social History Narrative   Not on file   Social Drivers of Health   Financial Resource Strain: Not on file  Food Insecurity: No Food Insecurity (02/22/2022)   Hunger Vital Sign    Worried About Running Out of Food in the Last Year: Never true    Ran Out of Food in the Last Year: Never true  Transportation Needs: No Transportation Needs (02/22/2022)   PRAPARE - Administrator, Civil Service (Medical): No    Lack of Transportation (Non-Medical): No  Physical Activity: Not on file  Stress: Not on file  Social Connections: Not on file  Intimate Partner Violence: Not At Risk (02/22/2022)   Humiliation, Afraid, Rape, and Kick questionnaire    Fear of Current  or Ex-Partner: No    Emotionally Abused: No    Physically Abused: No    Sexually Abused: No     Review of Systems    General:  No chills, fever, night sweats or weight changes.  Cardiovascular:  No chest pain, dyspnea on exertion, edema, orthopnea, palpitations, paroxysmal nocturnal dyspnea. Dermatological: No rash, lesions/masses Respiratory: No cough, dyspnea Urologic: No hematuria, dysuria Abdominal:   No nausea, vomiting, diarrhea, bright red blood per rectum, melena, or hematemesis Neurologic:  No visual changes, wkns, changes in mental status. All other systems reviewed and are otherwise negative except as noted above.  Physical Exam    VS:  There were no vitals taken for this visit. , BMI There is no height or weight on file to calculate BMI. GEN: Well nourished, well developed, in no acute distress. HEENT: normal. Neck: Supple, no JVD, carotid bruits,  or masses. Cardiac: RRR, no murmurs, rubs, or gallops. No clubbing, cyanosis, edema.  Radials/DP/PT 2+ and equal bilaterally.  Respiratory:  Respirations regular and unlabored, clear to auscultation bilaterally. GI: Soft, nontender, nondistended, BS + x 4. MS: no deformity or atrophy. Skin: warm and dry, no rash. Neuro:  Strength and sensation are intact. Psych: Normal affect.  Accessory Clinical Findings    Recent Labs: 06/17/2023: ALT 20; BUN 14; Creatinine, Ser 0.69; Potassium 4.2; Sodium 142   Recent Lipid Panel    Component Value Date/Time   CHOL 137 10/09/2022 1000   TRIG 195 (H) 10/09/2022 1000   HDL 40 10/09/2022 1000   CHOLHDL 3.4 10/09/2022 1000   CHOLHDL 5.6 07/24/2021 1929   VLDL 32 07/24/2021 1929   LDLCALC 65 10/09/2022 1000    No BP recorded.  {Refresh Note OR Click here to enter BP  :1}***    ECG personally reviewed by me today- ***     Chest CT 04/13/2022   FINDINGS: Cardiovascular: Normal heart size. No pericardial effusion. Normal caliber thoracic aorta with moderate atherosclerotic  disease. Severe three-vessel coronary artery calcifications.   Mediastinum/Nodes: Esophagus and thyroid  are unremarkable. No enlarged lymph nodes seen in the chest.   Lungs/Pleura: Central airways are patent. Moderate to severe centrilobular emphysema. Innumerable diffuse ill-defined centrilobular ground-glass nodules. No consolidation, pleural effusion or pneumothorax. Small solid pulmonary nodule of the right lower lobe measuring 2.2 mm on image 210.   Upper Abdomen: No acute abnormality.   Musculoskeletal: No chest wall mass or suspicious bone lesions identified.   IMPRESSION: 1. Lung-RADS 2S, benign appearance or behavior. Continue annual screening with low-dose chest CT without contrast in 12 months. S modifier for coronary artery calcifications. 2. Innumerable diffuse ill-defined centrilobular ground-glass nodules, likely smoking related respiratory bronchiolitis. 3. Severe three-vessel coronary artery calcifications, recommend ASCVD risk assessment. 4. Aortic Atherosclerosis (ICD10-I70.0) and Emphysema (ICD10-J43.9).     Electronically Signed   By: Rea Marc M.D.   On: 04/16/2022 16:28    Assessment & Plan   1.  ***   Donald Bender. Roark Rufo NP-C     12/08/2023, 1:03 PM [facility] [address] [place], [place] [address] Office [phone]    Notice: This dictation was prepared with Dragon dictation along with smaller phrase technology. Any transcriptional errors that result from this process are unintentional and may not be corrected upon review.   I spent***minutes examining this patient, reviewing medications, and using patient centered shared decision making involving their cardiac care.   I spent  20 minutes reviewing past medical history,  medications, and prior cardiac tests.

## 2023-12-09 ENCOUNTER — Ambulatory Visit: Admitting: Internal Medicine

## 2023-12-09 ENCOUNTER — Ambulatory Visit: Attending: General Practice | Admitting: General Practice

## 2023-12-09 ENCOUNTER — Ambulatory Visit: Admitting: General Practice

## 2023-12-09 ENCOUNTER — Encounter: Payer: Self-pay | Admitting: General Practice

## 2023-12-09 VITALS — BP 124/78 | HR 94 | Ht 75.0 in | Wt 242.0 lb

## 2023-12-09 DIAGNOSIS — E119 Type 2 diabetes mellitus without complications: Secondary | ICD-10-CM | POA: Diagnosis not present

## 2023-12-09 DIAGNOSIS — I2510 Atherosclerotic heart disease of native coronary artery without angina pectoris: Secondary | ICD-10-CM | POA: Insufficient documentation

## 2023-12-09 DIAGNOSIS — E1159 Type 2 diabetes mellitus with other circulatory complications: Secondary | ICD-10-CM | POA: Diagnosis not present

## 2023-12-09 DIAGNOSIS — I10 Essential (primary) hypertension: Secondary | ICD-10-CM | POA: Insufficient documentation

## 2023-12-09 DIAGNOSIS — I152 Hypertension secondary to endocrine disorders: Secondary | ICD-10-CM | POA: Insufficient documentation

## 2023-12-09 NOTE — Patient Instructions (Addendum)
 Medication Instructions:  Your physician recommends that you continue on your current medications as directed. Please refer to the Current Medication list given to you today. *If you need a refill on your cardiac medications before your next appointment, please call your pharmacy*  Lab Work: COMPLETE LABS AT YOUR PCP-CBC, LIPID, LFT, BMET  If you have labs (blood work) drawn today and your tests are completely normal, you will receive your results only by: MyChart Message (if you have MyChart) OR A paper copy in the mail If you have any lab test that is abnormal or we need to change your treatment, we will call you to review the results.  Testing/Procedures: NONE ORDERED  Follow-Up: At [facility], you and your health needs are our priority.  As part of our continuing mission to provide you with exceptional heart care, our providers are all part of one team.  This team includes your primary Cardiologist (physician) and Advanced Practice Providers or APPs (Physician Assistants and Nurse Practitioners) who all work together to provide you with the care you need, when you need it.  Your next appointment:   9-12 month(s)  Provider:   Arun K Thukkani, MD  or Josefa Beauvais, NP  We recommend signing up for the patient portal called MyChart.  Sign up information is provided on this After Visit Summary.  MyChart is used to connect with patients for Virtual Visits (Telemedicine).  Patients are able to view lab/test results, encounter notes, upcoming appointments, etc.  Non-urgent messages can be sent to your provider as well.   To learn more about what you can do with MyChart, go to [email or web address].   Other Instructions  Exercise regularly as told by your doctor. Make sure to weight daily and keep a weight log.   Moderate-intensity exercise is any activity that gets you moving enough to burn at least three times more energy (calories) than if you were sitting. Examples of  moderate exercise include: Walking a mile in 15 minutes. Doing light yard work. Biking at an easy pace. Most people should get at least 150 minutes of moderate-intensity exercise a week to maintain their body weight.  Increase your water  intake: Maintain hydration

## 2023-12-18 ENCOUNTER — Other Ambulatory Visit: Payer: Self-pay

## 2023-12-18 ENCOUNTER — Ambulatory Visit: Attending: Family Medicine | Admitting: Family Medicine

## 2023-12-18 VITALS — BP 135/77 | HR 93 | Temp 97.7°F | Ht 75.0 in | Wt 242.2 lb

## 2023-12-18 DIAGNOSIS — F17210 Nicotine dependence, cigarettes, uncomplicated: Secondary | ICD-10-CM

## 2023-12-18 DIAGNOSIS — Z7982 Long term (current) use of aspirin: Secondary | ICD-10-CM | POA: Diagnosis not present

## 2023-12-18 DIAGNOSIS — E291 Testicular hypofunction: Secondary | ICD-10-CM | POA: Diagnosis not present

## 2023-12-18 DIAGNOSIS — I152 Hypertension secondary to endocrine disorders: Secondary | ICD-10-CM

## 2023-12-18 DIAGNOSIS — Z79899 Other long term (current) drug therapy: Secondary | ICD-10-CM | POA: Diagnosis not present

## 2023-12-18 DIAGNOSIS — Z7984 Long term (current) use of oral hypoglycemic drugs: Secondary | ICD-10-CM

## 2023-12-18 DIAGNOSIS — E1159 Type 2 diabetes mellitus with other circulatory complications: Secondary | ICD-10-CM | POA: Diagnosis not present

## 2023-12-18 DIAGNOSIS — E1169 Type 2 diabetes mellitus with other specified complication: Secondary | ICD-10-CM

## 2023-12-18 DIAGNOSIS — E559 Vitamin D deficiency, unspecified: Secondary | ICD-10-CM | POA: Diagnosis not present

## 2023-12-18 DIAGNOSIS — E119 Type 2 diabetes mellitus without complications: Secondary | ICD-10-CM

## 2023-12-18 DIAGNOSIS — R0902 Hypoxemia: Secondary | ICD-10-CM

## 2023-12-18 DIAGNOSIS — M1A0710 Idiopathic chronic gout, right ankle and foot, without tophus (tophi): Secondary | ICD-10-CM | POA: Diagnosis not present

## 2023-12-18 DIAGNOSIS — Z23 Encounter for immunization: Secondary | ICD-10-CM

## 2023-12-18 LAB — POCT GLYCOSYLATED HEMOGLOBIN (HGB A1C): HbA1c, POC (controlled diabetic range): 7 % (ref 0.0–7.0)

## 2023-12-18 MED ORDER — ATORVASTATIN CALCIUM 80 MG PO TABS
80.0000 mg | ORAL_TABLET | Freq: Every day | ORAL | 1 refills | Status: AC
  Filled 2023-12-18 – 2024-01-28 (×2): qty 90, 90d supply, fill #0

## 2023-12-18 MED ORDER — ALLOPURINOL 300 MG PO TABS
300.0000 mg | ORAL_TABLET | Freq: Every day | ORAL | 1 refills | Status: AC
  Filled 2023-12-18 – 2024-01-28 (×2): qty 90, 90d supply, fill #0

## 2023-12-18 MED ORDER — AMLODIPINE BESYLATE 10 MG PO TABS
10.0000 mg | ORAL_TABLET | Freq: Every day | ORAL | 1 refills | Status: AC
  Filled 2023-12-18 – 2024-01-28 (×2): qty 90, 90d supply, fill #0

## 2023-12-18 MED ORDER — METFORMIN HCL 1000 MG PO TABS
1000.0000 mg | ORAL_TABLET | Freq: Two times a day (BID) | ORAL | 1 refills | Status: AC
  Filled 2023-12-18 – 2024-01-28 (×2): qty 180, 90d supply, fill #0

## 2023-12-18 MED ORDER — VALSARTAN 160 MG PO TABS
160.0000 mg | ORAL_TABLET | Freq: Every day | ORAL | 1 refills | Status: AC
  Filled 2023-12-18 – 2024-01-07 (×2): qty 90, 90d supply, fill #0

## 2023-12-18 MED ORDER — EMPAGLIFLOZIN 25 MG PO TABS
25.0000 mg | ORAL_TABLET | Freq: Every day | ORAL | 1 refills | Status: AC
  Filled 2023-12-18 – 2024-01-28 (×2): qty 90, 90d supply, fill #0

## 2023-12-18 NOTE — Patient Instructions (Signed)
 VISIT SUMMARY:  Today, you came in for a follow-up on your diabetes and other medical conditions. We reviewed your current medications, discussed your recent lab results, and addressed your concerns about smoking and lung health. Additionally, we talked about your testosterone  therapy and general health maintenance.  YOUR PLAN:  -TYPE 2 DIABETES MELLITUS WITH HYPERTENSION AND HYPERLIPIDEMIA: Type 2 diabetes is a condition where your body does not use insulin  properly, leading to high blood sugar levels. Your A1c is at 7.0%, which is slightly increased but still within the target range. Your blood pressure is excellent, and your cholesterol is being managed with atorvastatin . Continue taking empagliflozin  and metformin  for diabetes, and amlodipine  and valsartan  for blood pressure. We have ordered a fasting lipid panel and comprehensive metabolic panel for next week. Please continue following your diabetic diet and be cautious with sweets during the holidays.  -HYPOXEMIA: Hypoxemia means low oxygen levels in your blood. Your oxygen saturation is at 88%, which is lower than before. This could be due to chronic obstructive pulmonary disease (COPD) from your smoking history. We have ordered a pulmonary function test and a CT scan of your lungs for further evaluation. Please call [facility] to schedule your CT scan. Seek medical attention if you experience shortness of breath.  -NICOTINE  DEPENDENCE, CIGARETTES: Nicotine  dependence means you are addicted to smoking cigarettes. Continuing to smoke increases your risk for COPD and other health issues. We discussed the benefits of quitting smoking to improve your lung health. Please consider smoking cessation options.  -HYPOGONADISM IN MALE: Hypogonadism is a condition where your body does not produce enough testosterone . Since the testosterone  therapy was ineffective, we have discontinued it and referred you to a urologist for further evaluation and  management.  -GENERAL HEALTH MAINTENANCE: For your general health, you are due for a flu shot and a foot exam. Regular eye exams are important and should be maintained. Today, you received your flu shot. Please ensure you have regular annual eye exams.  INSTRUCTIONS:  Please schedule your fasting lipid panel and comprehensive metabolic panel for next week. Call [facility] to schedule your CT scan. Follow up with a urologist for further evaluation of your hypogonadism. Continue with your diabetic diet and be cautious with sweets during the holidays. Seek medical attention if you experience shortness of breath.

## 2023-12-18 NOTE — Progress Notes (Signed)
 Subjective:  Patient ID: Donald Bender, male    DOB: 1960/09/16  Age: 63 y.o. MRN: [id number]  CC: MEDICAL MANAGMENT (Testosterone  not working)     Discussed the use of AI scribe software for clinical note transcription with the patient, who gave verbal consent to proceed.  History of Present Illness Donald Bender is a 63 year old male with a history of type 2 diabetes mellitus hypertension, polycythemia, aortic atherosclerosis, coronary artery calcifications tobacco abuse (2-3 packs of cigarettes/day for close to 50 years), hypogonadism who presents for follow-up on his diabetes and other medical conditions.  Diabetes management is ongoing with an A1c of 7.0%, slightly increased from 6.7%. He continues on Jardiance  and metformin  and follows a diabetic diet.  He has a history of smoking and has not completed a CT scan for lung cancer screening. Oxygen saturation is 88% today, down from 92% previously. He experiences no breathing difficulties or shortness of breath during activities.  He takes amlodipine  for blood pressure and atorvastatin  for cholesterol. Cholesterol levels have not been checked since last year, and he is due for a fasting lipid panel and comprehensive metabolic panel.  He discontinued testosterone  therapy due to lack of efficacy and inconvenience and persisting fatigue. He was also placed on Drisdol  due to Vit D deficiency which he has completed. Last level was normal in 03/2023. 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    Past Medical History:  Diagnosis Date   Acute respiratory failure with hypoxia (HCC) 07/25/2021   Arthritis    osteoarthritis and gout   Diabetes mellitus without complication (HCC)    Diverticulitis    Hypertension     Past Surgical History:  Procedure Laterality Date   COLONOSCOPY     HYDROCELE EXCISION / REPAIR     NO PAST SURGERIES     PROCTOSCOPY N/A 02/22/2022   Procedure: RIGID PROCTOSCOPY;  Surgeon:  Sheldon Standing, MD;  Location: [facility];  Service: General;  Laterality: N/A;   XI ROBOTIC ASSISTED LOWER ANTERIOR RESECTION  02/22/2022   for diverticulitis/takedown of colovesical fistula   XI ROBOTIC LAPAROSCOPIC ASSISTED APPENDECTOMY  02/22/2022    Family History  Problem Relation Age of Onset   Leukemia Mother 7   Diverticulitis Brother    Colon cancer Neg Hx    Stomach cancer Neg Hx    Esophageal cancer Neg Hx    Colon polyps Neg Hx     Social History   Socioeconomic History   Marital status: Married    Spouse name: Not on file   Number of children: 0   Years of education: Not on file   Highest education level: Not on file  Occupational History   Occupation: Holiday Representative  Tobacco Use   Smoking status: Every Day    Current packs/day: 1.00    Types: Cigarettes   Smokeless tobacco: Never   Tobacco comments:    Smoking 1 ppd  Vaping Use   Vaping status: Never Used  Substance and Sexual Activity   Alcohol use: Not Currently    Alcohol/week: 6.0 standard drinks of alcohol    Types: 6 Cans of beer per week    Comment: quit drinking   Drug use: No   Sexual activity: Yes  Other Topics Concern   Not on file  Social History Narrative   Not on file   Social Drivers of Health   Financial Resource Strain: Not on file  Food Insecurity: No Food Insecurity (02/22/2022)  Hunger Vital Sign    Worried About Running Out of Food in the Last Year: Never true    Ran Out of Food in the Last Year: Never true  Transportation Needs: No Transportation Needs (02/22/2022)   PRAPARE - Administrator, Civil Service (Medical): No    Lack of Transportation (Non-Medical): No  Physical Activity: Not on file  Stress: Not on file  Social Connections: Not on file    No Known Allergies  Outpatient Medications Prior to Visit  Medication Sig Dispense Refill   aspirin  EC 81 MG tablet Take 1 tablet (81 mg total) by mouth daily. 30 tablet 5   allopurinol  (ZYLOPRIM ) 300 MG tablet  Take 1 tablet (300 mg total) by mouth daily. 90 tablet 1   amLODipine  (NORVASC ) 10 MG tablet Take 1 tablet (10 mg total) by mouth daily. 90 tablet 1   atorvastatin  (LIPITOR) 80 MG tablet Take 1 tablet (80 mg total) by mouth daily. 90 tablet 1   empagliflozin  (JARDIANCE ) 25 MG TABS tablet Take 1 tablet (25 mg total) by mouth daily before breakfast. 90 tablet 1   metFORMIN  (GLUCOPHAGE ) 1000 MG tablet Take 1 tablet (1,000 mg total) by mouth 2 (two) times daily with a meal. 180 tablet 1   valsartan  (DIOVAN ) 160 MG tablet Take 1 tablet (160 mg total) by mouth daily. 90 tablet 1   ergocalciferol  (DRISDOL ) 1.25 MG (50000 UT) capsule Take 1 capsule (50,000 Units total) by mouth once a week. 12 capsule 1   testosterone  cypionate (DEPOTESTOSTERONE CYPIONATE) 200 MG/ML injection Inject 1 mL (200 mg total) into the muscle every 14 (fourteen) days. (Patient not taking: Reported on 12/18/2023) 2 mL 5   Facility-Administered Medications Prior to Visit  Medication Dose Route Frequency Provider Last Rate Last Admin   0.9 %  sodium chloride  infusion  500 mL Intravenous Once Cirigliano, Vito V, DO       testosterone  cypionate (DEPOTESTOSTERONE CYPIONATE) injection 200 mg  200 mg Intramuscular Q14 Days Graeson Nouri, MD   200 mg at 11/04/23 1501   testosterone  cypionate (DEPOTESTOSTERONE CYPIONATE) injection 200 mg  200 mg Intramuscular Once Terryann Verbeek, MD         ROS Review of Systems  Constitutional:  Positive for fatigue. Negative for activity change and appetite change.  HENT:  Negative for sinus pressure and sore throat.   Respiratory:  Negative for chest tightness, shortness of breath and wheezing.   Cardiovascular:  Negative for chest pain and palpitations.  Gastrointestinal:  Negative for abdominal distention, abdominal pain and constipation.  Genitourinary: Negative.   Musculoskeletal: Negative.   Psychiatric/Behavioral:  Negative for behavioral problems and dysphoric mood.     Diabetic Foot  Exam - Simple   Simple Foot Form Diabetic Foot exam was performed with the following findings: Yes 12/18/2023  2:09 PM  Visual Inspection See comments: Yes Sensation Testing Intact to touch and monofilament testing bilaterally: Yes Pulse Check Posterior Tibialis and Dorsalis pulse intact bilaterally: Yes Comments Hammer toes x10. No calluses or skin break down     Objective:  BP 135/77   Pulse 93   Temp 97.7 F (36.5 C) (Oral)   Ht 6' 3 (1.905 m)   Wt 242 lb 3.2 oz (109.9 kg)   SpO2 (!) 88%   BMI 30.27 kg/m      12/18/2023    1:50 PM 12/09/2023   10:22 AM 12/09/2023    9:55 AM  BP/Weight  Systolic BP 135 124 144  Diastolic  BP 77 78 68  Wt. (Lbs) 242.2  242  BMI 30.27 kg/m2  30.25 kg/m2      Physical Exam Constitutional:      Appearance: He is well-developed.  Cardiovascular:     Rate and Rhythm: Normal rate.     Heart sounds: Normal heart sounds. No murmur heard. Pulmonary:     Effort: Pulmonary effort is normal.     Breath sounds: Normal breath sounds. No wheezing or rales.  Chest:     Chest wall: No tenderness.  Abdominal:     General: Bowel sounds are normal. There is no distension.     Palpations: Abdomen is soft. There is no mass.     Tenderness: There is no abdominal tenderness.  Musculoskeletal:        General: Normal range of motion.     Right lower leg: No edema.     Left lower leg: No edema.  Neurological:     Mental Status: He is alert and oriented to person, place, and time.  Psychiatric:        Mood and Affect: Mood normal.        Latest Ref Rng & Units 06/17/2023    3:26 PM 03/19/2023    5:05 PM 10/09/2022   10:00 AM  CMP  Glucose 70 - 99 mg/dL 893  871    BUN 8 - 27 mg/dL 14  13    Creatinine 9.23 - 1.27 mg/dL 9.30  9.41    Sodium 865 - 144 mmol/L 142  143    Potassium 3.5 - 5.2 mmol/L 4.2  4.6    Chloride 96 - 106 mmol/L 103  101    CO2 20 - 29 mmol/L 22  25    Calcium  8.6 - 10.2 mg/dL 9.7  89.0    Total Protein 6.0 - 8.5  g/dL 6.9  7.1  6.6   Total Bilirubin 0.0 - 1.2 mg/dL 0.3  0.3  0.3   Alkaline Phos 44 - 121 IU/L 74  74  63   AST 0 - 40 IU/L 14  14  10    ALT 0 - 44 IU/L 20  22  14      Lipid Panel     Component Value Date/Time   CHOL 137 10/09/2022 1000   TRIG 195 (H) 10/09/2022 1000   HDL 40 10/09/2022 1000   CHOLHDL 3.4 10/09/2022 1000   CHOLHDL 5.6 07/24/2021 1929   VLDL 32 07/24/2021 1929   LDLCALC 65 10/09/2022 1000    CBC    Component Value Date/Time   WBC 10.9 (H) 02/23/2022 0440   RBC 4.23 02/23/2022 0440   HGB 12.2 (L) 02/23/2022 0440   HCT 38.0 (L) 02/23/2022 0440   PLT 176 02/23/2022 0440   MCV 89.8 02/23/2022 0440   MCH 28.8 02/23/2022 0440   MCHC 32.1 02/23/2022 0440   RDW 16.3 (H) 02/23/2022 0440   LYMPHSABS 1.6 11/30/2021 1858   MONOABS 1.6 (H) 11/30/2021 1858   EOSABS 0.0 11/30/2021 1858   BASOSABS 0.1 11/30/2021 1858    Lab Results  Component Value Date   HGBA1C 7.0 12/18/2023    Lab Results  Component Value Date   HGBA1C 7.0 12/18/2023   HGBA1C 6.7 06/17/2023   HGBA1C 7.3 (A) 03/19/2023       Assessment & Plan Type 2 diabetes mellitus with hypertension and hyperlipidemia A1c at 7.0, slightly increased but within target. Blood pressure excellent. Hyperlipidemia managed with atorvastatin . - Continue empagliflozin  and metformin . - Continue  amlodipine  and valsartan . - Ordered fasting lipid panel and comprehensive metabolic panel for next week. - Advised adherence to diabetic diet and caution with sweets during holidays.  Hypoxemia Oxygen saturation at 88%. Repeat O2 sat was 89%Differential includes COPD due to smoking history. No prior COPD diagnosis. - Ordered pulmonary function test. - Ordered CT scan of the lungs for lung cancer screening. - Advised to call [facility] to schedule CT scan for lung ca screening - Instructed to seek medical attention if experiencing shortness of breath. -At the moment he is asymptomatic. A stauration of 88%  calls for oxygen supplementation but I will hold off at this time.  Smoking greater then 20 pack year history Continues smoking despite risks. Discussed benefits of cessation for COPD. - Encouraged smoking cessation to improve lung health. -He needs to complete previously ordered lung CT  Smoking cessation support: smoking cessation hotline: 1-800-QUIT-NOW.  Smoking cessation classes are available through [facility]. Call [phone] or visit our website at [email or web address].  Spent 3 minutes counseling on dangers of tobacco use and benefits of quitting, offered pharmacological intervention to aid quitting and patient is not ready to quit.   Hypogonadism in male Testosterone  therapy ineffective. Discontinued therapy. - Referred to urologist for further evaluation and management.   Vitamin D  deficiency -Last level in 03/2023 was normal -In the light of ongoing fatigue I will check level again  General Health Maintenance Due for flu shot and foot exam. Regular eye exams maintained. - Administered flu shot. - Ensure regular annual eye exams.     Healthcare maintenance Need for immunization against influenza - Flu shot administered  Meds ordered this encounter  Medications   allopurinol  (ZYLOPRIM ) 300 MG tablet    Sig: Take 1 tablet (300 mg total) by mouth daily.    Dispense:  90 tablet    Refill:  1   amLODipine  (NORVASC ) 10 MG tablet    Sig: Take 1 tablet (10 mg total) by mouth daily.    Dispense:  90 tablet    Refill:  1   atorvastatin  (LIPITOR) 80 MG tablet    Sig: Take 1 tablet (80 mg total) by mouth daily.    Dispense:  90 tablet    Refill:  1   empagliflozin  (JARDIANCE ) 25 MG TABS tablet    Sig: Take 1 tablet (25 mg total) by mouth daily before breakfast.    Dispense:  90 tablet    Refill:  1   metFORMIN  (GLUCOPHAGE ) 1000 MG tablet    Sig: Take 1 tablet (1,000 mg total) by mouth 2 (two) times daily with a meal.    Dispense:  180  tablet    Refill:  1   valsartan  (DIOVAN ) 160 MG tablet    Sig: Take 1 tablet (160 mg total) by mouth daily.    Dispense:  90 tablet    Refill:  1    Follow-up: Return in about 6 months (around 06/16/2024) for Chronic medical conditions.       Corrina Sabin, MD, FAAFP. [facility] [place], [place] [phone]   12/18/2023, 2:27 PM

## 2023-12-23 ENCOUNTER — Ambulatory Visit: Attending: Family Medicine

## 2023-12-23 DIAGNOSIS — E119 Type 2 diabetes mellitus without complications: Secondary | ICD-10-CM

## 2023-12-23 DIAGNOSIS — E559 Vitamin D deficiency, unspecified: Secondary | ICD-10-CM | POA: Diagnosis not present

## 2023-12-24 ENCOUNTER — Ambulatory Visit: Payer: Self-pay | Admitting: Family Medicine

## 2023-12-24 ENCOUNTER — Other Ambulatory Visit: Payer: Self-pay

## 2023-12-24 LAB — CMP14+EGFR
ALT: 19 IU/L (ref 0–44)
AST: 13 IU/L (ref 0–40)
Albumin: 4.3 g/dL (ref 3.9–4.9)
Alkaline Phosphatase: 71 IU/L (ref 47–123)
BUN/Creatinine Ratio: 25 — ABNORMAL HIGH (ref 10–24)
BUN: 18 mg/dL (ref 8–27)
Bilirubin Total: 0.4 mg/dL (ref 0.0–1.2)
CO2: 18 mmol/L — ABNORMAL LOW (ref 20–29)
Calcium: 9.3 mg/dL (ref 8.6–10.2)
Chloride: 103 mmol/L (ref 96–106)
Creatinine, Ser: 0.71 mg/dL — ABNORMAL LOW (ref 0.76–1.27)
Globulin, Total: 2.6 g/dL (ref 1.5–4.5)
Glucose: 210 mg/dL — ABNORMAL HIGH (ref 70–99)
Potassium: 4.3 mmol/L (ref 3.5–5.2)
Sodium: 140 mmol/L (ref 134–144)
Total Protein: 6.9 g/dL (ref 6.0–8.5)
eGFR: 103 mL/min/1.73 (ref >59)

## 2023-12-24 LAB — LP+NON-HDL CHOLESTEROL
Cholesterol, Total: 118 mg/dL (ref 100–199)
HDL: 39 mg/dL — ABNORMAL LOW (ref >39)
LDL Chol Calc (NIH): 39 mg/dL (ref 0–99)
Total Non-HDL-Chol (LDL+VLDL): 79 mg/dL (ref 0–129)
Triglycerides: 262 mg/dL — ABNORMAL HIGH (ref 0–149)
VLDL Cholesterol Cal: 40 mg/dL (ref 5–40)

## 2023-12-24 LAB — VITAMIN D 25 HYDROXY (VIT D DEFICIENCY, FRACTURES): Vit D, 25-Hydroxy: 26.5 ng/mL — ABNORMAL LOW (ref 30.0–100.0)

## 2023-12-24 MED ORDER — ERGOCALCIFEROL 1.25 MG (50000 UT) PO CAPS
50000.0000 [IU] | ORAL_CAPSULE | ORAL | 1 refills | Status: AC
  Filled 2023-12-24: qty 12, 84d supply, fill #0

## 2024-01-02 ENCOUNTER — Other Ambulatory Visit: Payer: Self-pay

## 2024-01-03 ENCOUNTER — Ambulatory Visit: Admitting: Plastic Surgery

## 2024-01-07 ENCOUNTER — Other Ambulatory Visit: Payer: Self-pay

## 2024-01-15 ENCOUNTER — Other Ambulatory Visit: Payer: Self-pay

## 2024-01-28 ENCOUNTER — Other Ambulatory Visit: Payer: Self-pay

## 2024-01-29 ENCOUNTER — Other Ambulatory Visit: Payer: Self-pay

## 2024-01-29 ENCOUNTER — Telehealth: Payer: Self-pay

## 2024-01-29 NOTE — Telephone Encounter (Signed)
 Pharmacy Patient Advocate Encounter   Received notification from CoverMyMeds that prior authorization for JARDIANCE  is required/requested.   Insurance verification completed.   The patient is insured through [employer].   Per test claim: PA required; PA submitted to above mentioned insurance via CoverMyMeds Key/confirmation #/EOC [id number] Status is pending

## 2024-01-31 ENCOUNTER — Other Ambulatory Visit: Payer: Self-pay

## 2024-02-03 ENCOUNTER — Other Ambulatory Visit: Payer: Self-pay

## 2024-03-06 ENCOUNTER — Other Ambulatory Visit: Payer: Self-pay

## 2024-03-06 MED ORDER — TESTOSTERONE 20.25 MG/ACT (1.62%) TD GEL
TRANSDERMAL | 4 refills | Status: AC
  Filled 2024-03-06: qty 75, 20d supply, fill #0

## 2024-06-16 ENCOUNTER — Ambulatory Visit: Admitting: Family Medicine
